# Patient Record
Sex: Female | Born: 1995
Health system: Southern US, Community
[De-identification: ages and names within clinical notes are randomized; demographics above are authoritative.]

## PROBLEM LIST (undated history)

## (undated) ENCOUNTER — Inpatient Hospital Stay: Payer: Self-pay

## (undated) DIAGNOSIS — J45909 Unspecified asthma, uncomplicated: Secondary | ICD-10-CM

## (undated) HISTORY — PX: NO PAST SURGERIES: SHX2092

## (undated) HISTORY — PX: WISDOM TOOTH EXTRACTION: SHX21

---
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
## 2017-02-12 DIAGNOSIS — R197 Diarrhea, unspecified: Secondary | ICD-10-CM | POA: Insufficient documentation

## 2017-02-12 DIAGNOSIS — R1013 Epigastric pain: Secondary | ICD-10-CM | POA: Insufficient documentation

## 2017-02-12 DIAGNOSIS — J45909 Unspecified asthma, uncomplicated: Secondary | ICD-10-CM | POA: Insufficient documentation

## 2017-02-12 LAB — COMPREHENSIVE METABOLIC PANEL
ALBUMIN: 4.2 g/dL (ref 3.5–5.0)
ALT: 12 U/L — AB (ref 14–54)
AST: 33 U/L (ref 15–41)
Alkaline Phosphatase: 43 U/L (ref 38–126)
Anion gap: 6 (ref 5–15)
BILIRUBIN TOTAL: 1.2 mg/dL (ref 0.3–1.2)
BUN: 11 mg/dL (ref 6–20)
CALCIUM: 9.2 mg/dL (ref 8.9–10.3)
CO2: 24 mmol/L (ref 22–32)
Chloride: 108 mmol/L (ref 101–111)
Creatinine, Ser: 0.86 mg/dL (ref 0.44–1.00)
GFR calc Af Amer: >60 mL/min (ref >60)
GFR calc non Af Amer: >60 mL/min (ref >60)
GLUCOSE: 107 mg/dL — AB (ref 65–99)
Potassium: 3.9 mmol/L (ref 3.5–5.1)
SODIUM: 138 mmol/L (ref 135–145)
Total Protein: 7.5 g/dL (ref 6.5–8.1)

## 2017-02-12 LAB — URINALYSIS, COMPLETE (UACMP) WITH MICROSCOPIC
BACTERIA UA: NONE SEEN
BILIRUBIN URINE: NEGATIVE
Glucose, UA: NEGATIVE mg/dL
HGB URINE DIPSTICK: NEGATIVE
Ketones, ur: NEGATIVE mg/dL
NITRITE: NEGATIVE
Protein, ur: NEGATIVE mg/dL
SPECIFIC GRAVITY, URINE: 1.013 (ref 1.005–1.030)
pH: 6 (ref 5.0–8.0)

## 2017-02-12 LAB — CBC
HCT: 41.7 % (ref 35.0–47.0)
Hemoglobin: 14.1 g/dL (ref 12.0–16.0)
MCH: 30.1 pg (ref 26.0–34.0)
MCHC: 33.8 g/dL (ref 32.0–36.0)
MCV: 89.3 fL (ref 80.0–100.0)
Platelets: 325 10*3/uL (ref 150–440)
RBC: 4.67 MIL/uL (ref 3.80–5.20)
RDW: 13.2 % (ref 11.5–14.5)
WBC: 7.6 10*3/uL (ref 3.6–11.0)

## 2017-02-12 LAB — POCT PREGNANCY, URINE: PREG TEST UR: NEGATIVE

## 2017-02-12 LAB — LIPASE, BLOOD: Lipase: 31 U/L (ref 11–51)

## 2017-02-12 NOTE — ED Triage Notes (Signed)
Patient reports having abdominal pain and diarrhea last week.  Tonight while at work became hot and then began to have abdominal cramping especially on the left side.

## 2017-02-13 ENCOUNTER — Emergency Department
Admission: EM | Admit: 2017-02-13 | Discharge: 2017-02-13 | Disposition: A | Discharge status: Home / Self Care | Payer: Medicaid Other | Attending: Emergency Medicine | Admitting: Emergency Medicine

## 2017-02-13 DIAGNOSIS — R197 Diarrhea, unspecified: Secondary | ICD-10-CM

## 2017-02-13 DIAGNOSIS — R1013 Epigastric pain: Secondary | ICD-10-CM

## 2017-02-13 MED ORDER — ONDANSETRON HCL 4 MG PO TABS: ORAL_TABLET | ORAL | 0 refills | Status: DC

## 2017-02-13 MED ORDER — SUCRALFATE 1 G PO TABS: 1.0000 g | ORAL_TABLET | Freq: Four times a day (QID) | ORAL | 1 refills | Status: DC | PRN

## 2017-02-13 MED ORDER — OMEPRAZOLE MAGNESIUM 20 MG PO TBEC: 20.0000 mg | DELAYED_RELEASE_TABLET | Freq: Every day | ORAL | 1 refills | Status: DC

## 2017-02-13 NOTE — ED Notes (Addendum)
Pt reports left lower abd pain for 1 week. Pt states diarrhea x 5 today.  Pt also has nausea but no vomiting.   Pt denies vag bleeding.  No dysuria.  No back pain.  Pt alert.  Pt taking pepto bismol with no relief.

## 2017-02-13 NOTE — ED Provider Notes (Signed)
[facility] Emergency Department Provider Note  ____________________________________________   First MD Initiated Contact with Patient 02/13/17 0200     (approximate)  I have reviewed the triage vital signs and the nursing notes.   HISTORY  Chief Complaint Abdominal Pain    HPI Lori Stevenson is a 21 y.o. female who reports a past history of a stomach ulcer as well as mild, well-controlled asthma who presents for evaluation of abdominal pain intermittently for about a week as well as multiple episodes of diarrhea.  She reports that the symptoms started about a week ago and have been persistent.  She may have told a different location of her pain in triage, but for me she is describing epigastric pain that seems to worse after she eats but resolves relatively quickly.  She has had persistent episodes of diarrhea, usually 5 or 6 episodes per day.  She has had some nausea but no vomiting.  Nothing particular makes her symptoms better nor worse except that she did notice that sometimes it happens more after she eats.  She feels sort of like when she was told in the past that she has an ulcer.  She does not take any acid reflux medicine; she stopped taking it at some point after she was last told to do so.  She denies fever/chills, chest pain, shortness of breath, dysuria, vaginal pain, vaginal bleeding.   Past Medical History:  Diagnosis Date  . Asthma     There are no active problems to display for this patient.   No past surgical history on file.  Prior to Admission medications   Medication Sig Start Date End Date Taking? Authorizing Provider  fluconazole (DIFLUCAN) 150 MG tablet Take 1 tablet (150 mg total) by mouth daily. 05/19/15   Tommi Rumps, PA-C  HYDROcodone-acetaminophen (NORCO) 7.5-325 MG tablet Take 1 tablet by mouth every 6 (six) hours as needed for moderate pain.    Historical Provider, MD  metroNIDAZOLE (FLAGYL) 500 MG tablet Take 1  tablet (500 mg total) by mouth 2 (two) times daily. 05/19/15   Tommi Rumps, PA-C  omeprazole (PRILOSEC OTC) 20 MG tablet Take 1 tablet (20 mg total) by mouth daily. 02/13/17 02/13/18  Loleta Rose, MD  ondansetron (ZOFRAN ODT) 8 MG disintegrating tablet Take 1 tablet (8 mg total) by mouth every 8 (eight) hours as needed for nausea or vomiting. 08/26/15   Joni Reining, PA-C  ondansetron [facility]) 4 MG tablet Take 1-2 tabs by mouth every 8 hours as needed for nausea/vomiting 02/13/17   Loleta Rose, MD  promethazine (PHENERGAN) 25 MG tablet Take 25 mg by mouth every 6 (six) hours as needed for nausea or vomiting.    Historical Provider, MD  sucralfate (CARAFATE) 1 g tablet Take 1 tablet (1 g total) by mouth 4 (four) times daily as needed (for abdominal discomfort, nausea, and/or vomiting). 02/13/17   Loleta Rose, MD  sulfamethoxazole-trimethoprim (BACTRIM DS,SEPTRA DS) 800-160 MG per tablet Take 1 tablet by mouth 2 (two) times daily. 05/21/15   Irean Hong, MD    Allergies Patient has no known allergies.  No family history on file.  Social History Social History  Substance Use Topics  . Smoking status: Never Smoker  . Smokeless tobacco: Not on file  . Alcohol use No    Review of Systems Constitutional: No fever/chills Eyes: No visual changes. ENT: No sore throat. Cardiovascular: Denies chest pain. Respiratory: Denies shortness of breath. Gastrointestinal: EpiGastric abdominal pain.  Nausea  and diarrhea for about a week, no vomiting.  No constipation. Genitourinary: Negative for dysuria. Musculoskeletal: Negative for back pain. Skin: Negative for rash. Neurological: Negative for headaches, focal weakness or numbness.  10-point ROS otherwise negative.  ____________________________________________   PHYSICAL EXAM:  VITAL SIGNS: ED Triage Vitals  Enc Vitals Group     BP 02/12/17 2058 115/64     Pulse Rate 02/12/17 2058 65     Resp 02/12/17 2058 20     Temp 02/12/17 2058 99.3  F (37.4 C)     Temp Source 02/12/17 2058 Oral     SpO2 02/12/17 2058 100 %     Weight 02/12/17 2057 125 lb (56.7 kg)     Height 02/12/17 2057  (1.473 m)     Head Circumference --      Peak Flow --      Pain Score 02/12/17 2056 6     Pain Loc --      Pain Edu? --      Excl. in GC? --     Constitutional: Alert and oriented. Well appearing and in no acute distress. Eyes: Conjunctivae are normal. PERRL. EOMI. Head: Atraumatic. Nose: No congestion/rhinnorhea. Mouth/Throat: Mucous membranes are moist. Neck: No stridor.  No meningeal signs.   Cardiovascular: Normal rate, regular rhythm. Good peripheral circulation. Grossly normal heart sounds. Respiratory: Normal respiratory effort.  No retractions. Lungs CTAB. Gastrointestinal: Soft and nontender. Negative Murphy's sign, no tenderness at McBurney's point.  No distention.  GU:  deferred Musculoskeletal: No lower extremity tenderness nor edema. No gross deformities of extremities. Neurologic:  Normal speech and language. No gross focal neurologic deficits are appreciated.  Skin:  Skin is warm, dry and intact. No rash noted. Psychiatric: Mood and affect are normal. Speech and behavior are normal.  ____________________________________________   LABS (all labs ordered are listed, but only abnormal results are displayed)  Labs Reviewed  COMPREHENSIVE METABOLIC PANEL - Abnormal; Notable for the following:       Result Value   Glucose, Bld 107 (*)    ALT 12 (*)    All other components within normal limits  URINALYSIS, COMPLETE (UACMP) WITH MICROSCOPIC - Abnormal; Notable for the following:    Color, Urine YELLOW (*)    APPearance CLEAR (*)    Leukocytes, UA TRACE (*)    Squamous Epithelial / LPF 0-5 (*)    All other components within normal limits  LIPASE, BLOOD  CBC  POCT PREGNANCY, URINE   ____________________________________________  EKG  None - EKG not ordered by ED  physician ____________________________________________  RADIOLOGY   No results found.  ____________________________________________   PROCEDURES  Critical Care performed: No   Procedure(s) performed:   Procedures   ____________________________________________   INITIAL IMPRESSION / ASSESSMENT AND PLAN / ED COURSE  Pertinent labs & imaging results that were available during my care of the patient were reviewed by me and considered in my medical decision making (see chart for details).  The patient is well-appearing and in no distress with normal vital signs and essentially normal lab work.  She has no elevation of her LFTs and she has no abdominal tenderness to palpation at this time including a negative Murphy sign.  I suspect that she is continuing to suffer from acid reflux but she has a benign abdominal exam that is not consistent with an open ulcer.  I encouraged her to take a PPI.  She may have a viral syndrome which is causing her diarrhea and I gave  her my usual and customary outpatient recommendations for diarrhea management but she has no metabolic disturbances at this time.  There is no indication for further imaging at this time and I gave my usual and customary return precautions.     ____________________________________________  FINAL CLINICAL IMPRESSION(S) / ED DIAGNOSES  Final diagnoses:  Diarrhea, unspecified type  Epigastric pain     MEDICATIONS GIVEN DURING THIS VISIT:  Medications - No data to display   NEW OUTPATIENT MEDICATIONS STARTED DURING THIS VISIT:  New Prescriptions   OMEPRAZOLE (PRILOSEC OTC) 20 MG TABLET    Take 1 tablet (20 mg total) by mouth daily.   ONDANSETRON (ZOFRAN) 4 MG TABLET    Take 1-2 tabs by mouth every 8 hours as needed for nausea/vomiting   SUCRALFATE (CARAFATE) 1 G TABLET    Take 1 tablet (1 g total) by mouth 4 (four) times daily as needed (for abdominal discomfort, nausea, and/or vomiting).    Modified Medications    No medications on file    Discontinued Medications   No medications on file     Note:  This document was prepared using Dragon voice recognition software and may include unintentional dictation errors.    Loleta Rose, MD 02/13/17 [phone]

## 2017-02-13 NOTE — ED Notes (Signed)
Report off to kuch rn  

## 2017-02-13 NOTE — Discharge Instructions (Signed)

## 2017-07-12 IMAGING — US US TRANSVAGINAL NON-OB
1 series · 13 of 25 positions shown · non-contrast
Comparison: None

CLINICAL DATA: Patient was evaluated [REDACTED] night for yeast
infection and bacterial vaginosis. Since pelvic exam [REDACTED] night
patient reports light vaginal bleeding and vaginal discomfort. LMP
05/06/2015

EXAM:
TRANSABDOMINAL AND TRANSVAGINAL ULTRASOUND OF PELVIS
TECHNIQUE: Both transabdominal and transvaginal ultrasound examinations of the
pelvis were performed. Transabdominal technique was performed for
global imaging of the pelvis including uterus, ovaries, adnexal
regions, and pelvic cul-de-sac. It was necessary to proceed with
endovaginal exam following the transabdominal exam to visualize the
endometrium and ovaries.

[Series 1: us transvaginal non-ob · 0.21mm/px · 13 of 81 slices shown]
[im 1/81]
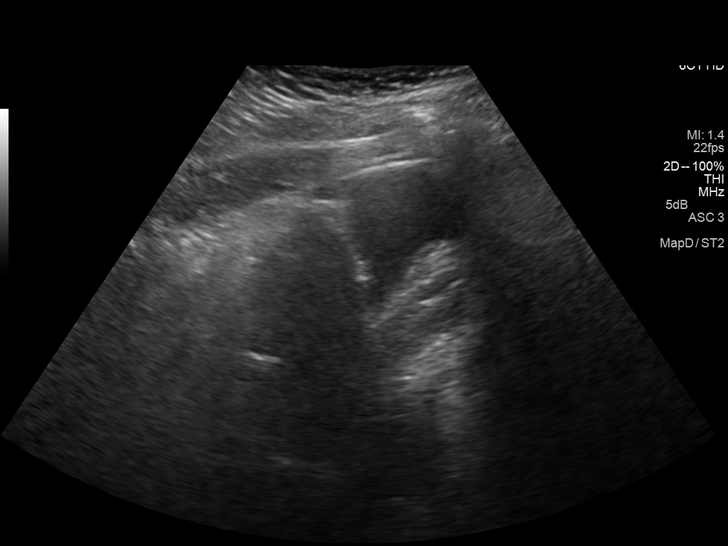
[im 7/81]
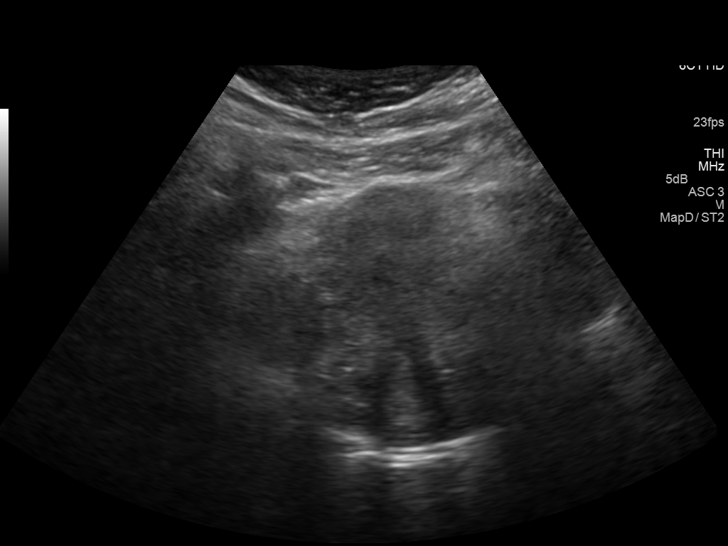
[im 14/81]
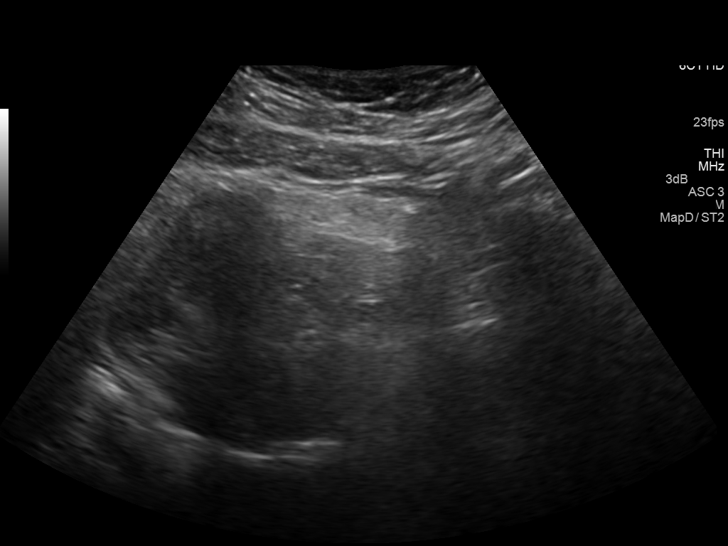
[im 21/81]
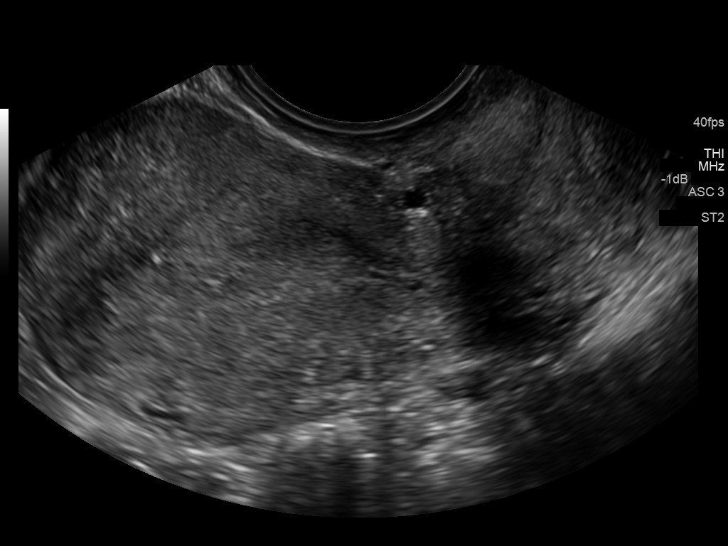
[im 27/81]
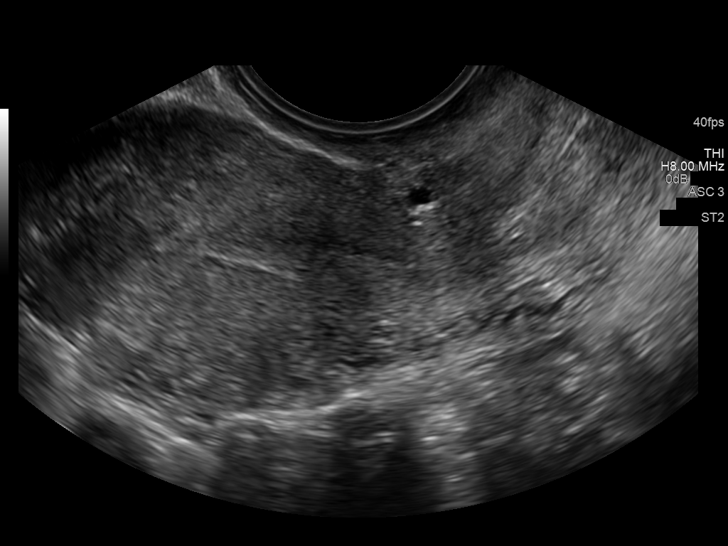
[im 34/81]
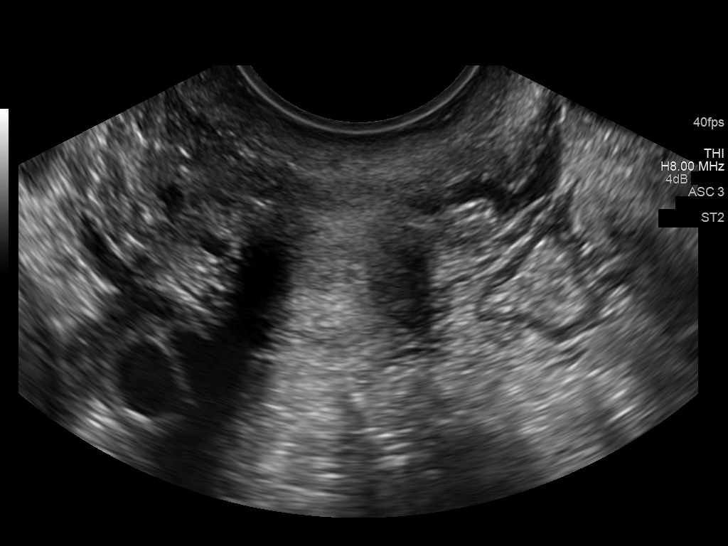
[im 41/81]
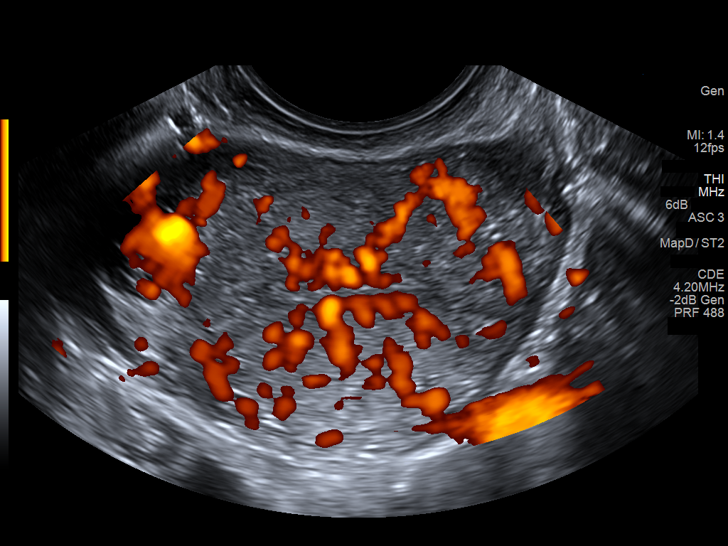
[im 47/81]
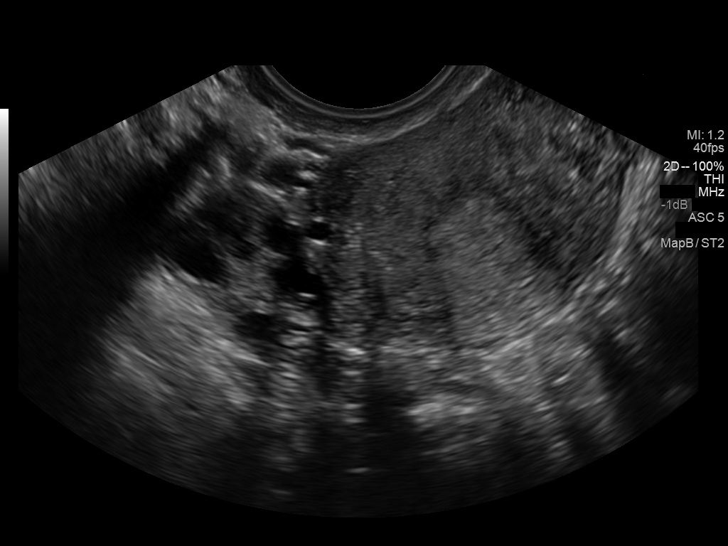
[im 54/81]
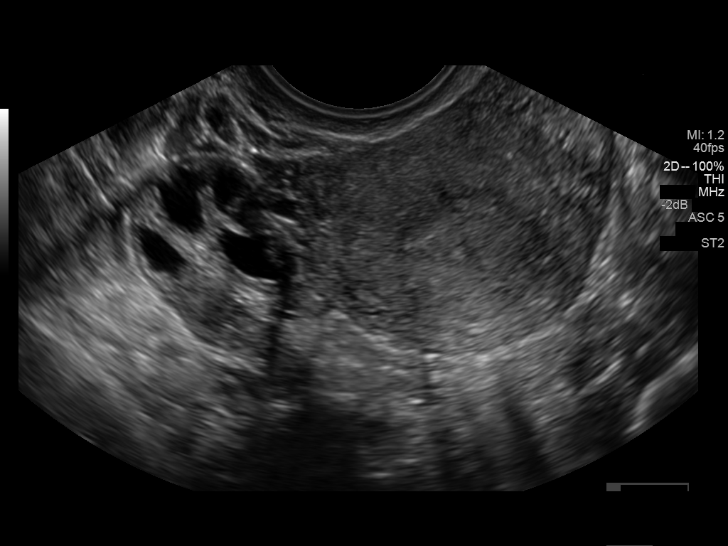
[im 61/81]
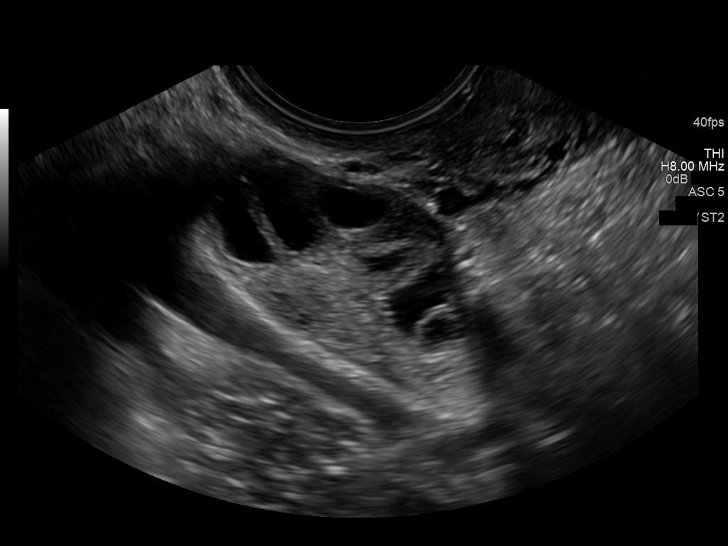
[im 67/81]
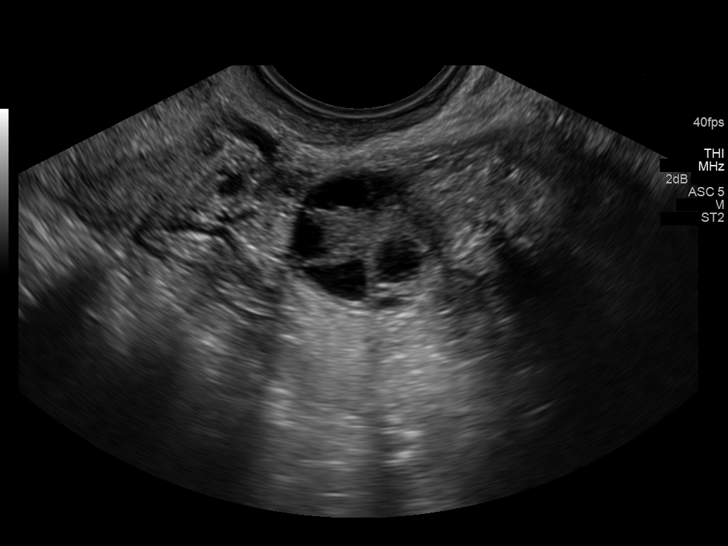
[im 74/81]
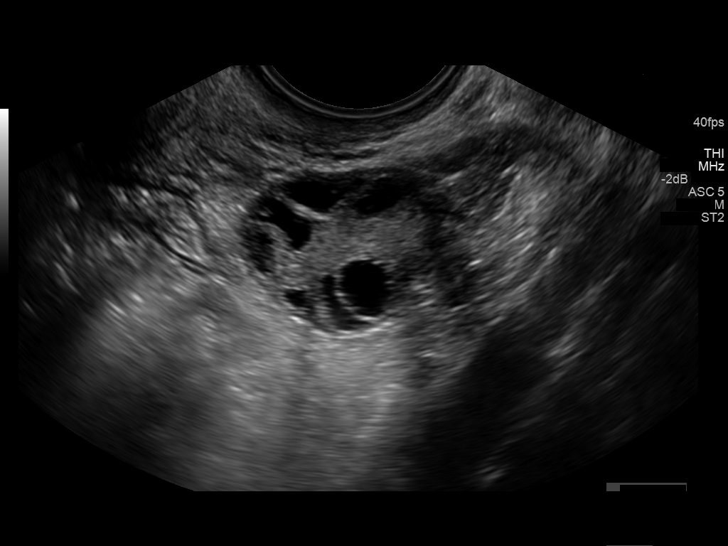
[im 81/81]
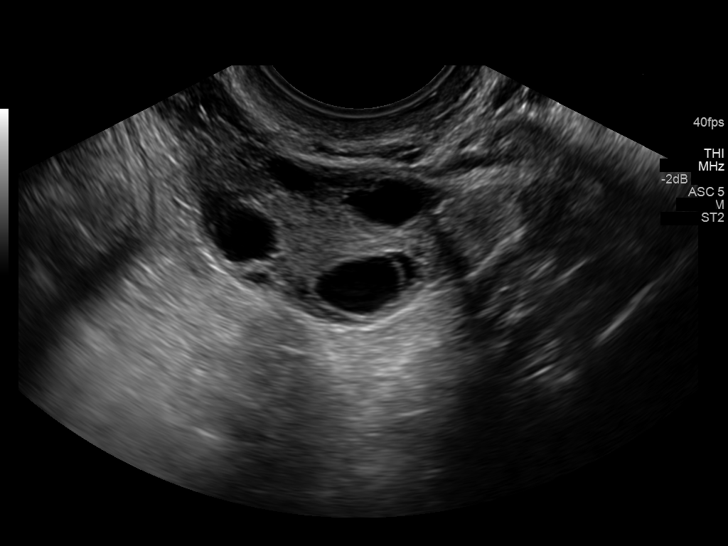

[13 of 25 positions shown; findings below may reference images not displayed]

FINDINGS: Uterus

Measurements: 6.2 x 3.6 x 4.2 cm. No fibroids or other mass
visualized.

Endometrium

Thickness: 5.4 mm.  No focal abnormality visualized.

Right ovary

Measurements: 3.6 x 1.6 x 1.8 cm. Multiple follicles are present.

Left ovary

Measurements: 3.7 x 2.0 x 2.5 cm. Multiple follicles are present.

Other findings

No free fluid.
IMPRESSION: 1. Normal appearance of the uterus and endometrium.
2. Somewhat prominent follicles bilaterally, an appearance which can
be seen in polycystic ovary disease. However this can also be a
variation of normal and correlation is recommended with clinical
findings.

## 2018-03-05 ENCOUNTER — Emergency Department
Admission: EM | Admit: 2018-03-05 | Discharge: 2018-03-05 | Disposition: A | Discharge status: Home / Self Care | Payer: BLUE CROSS/BLUE SHIELD | Attending: Emergency Medicine | Admitting: Emergency Medicine

## 2018-03-05 ENCOUNTER — Encounter: Payer: Self-pay | Admitting: Emergency Medicine

## 2018-03-05 DIAGNOSIS — Z79899 Other long term (current) drug therapy: Secondary | ICD-10-CM | POA: Insufficient documentation

## 2018-03-05 DIAGNOSIS — J4520 Mild intermittent asthma, uncomplicated: Secondary | ICD-10-CM | POA: Insufficient documentation

## 2018-03-05 DIAGNOSIS — R0602 Shortness of breath: Secondary | ICD-10-CM | POA: Diagnosis present

## 2018-03-05 MED ORDER — ALBUTEROL SULFATE (2.5 MG/3ML) 0.083% IN NEBU
INHALATION_SOLUTION | RESPIRATORY_TRACT | Status: AC
  Filled 2018-03-05: qty 6

## 2018-03-05 MED ORDER — ALBUTEROL SULFATE (2.5 MG/3ML) 0.083% IN NEBU: 2.5000 mg | INHALATION_SOLUTION | Freq: Four times a day (QID) | RESPIRATORY_TRACT | 12 refills | Status: DC | PRN

## 2018-03-05 MED ORDER — ALBUTEROL SULFATE (2.5 MG/3ML) 0.083% IN NEBU
5.0000 mg | INHALATION_SOLUTION | Freq: Once | RESPIRATORY_TRACT | Status: AC
  Administered 2018-03-05: 5 mg via RESPIRATORY_TRACT

## 2018-03-05 MED ORDER — ALBUTEROL SULFATE HFA 108 (90 BASE) MCG/ACT IN AERS: 2.0000 | INHALATION_SPRAY | Freq: Four times a day (QID) | RESPIRATORY_TRACT | 2 refills | Status: DC | PRN

## 2018-03-05 NOTE — ED Triage Notes (Signed)
Pt to ED with c/o of SOB r/t asthma. Pt is a [employer] and left without her medication.
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
## 2018-03-05 NOTE — ED Notes (Signed)
See triage note.  States she had an asthma attack this am  States she does not have her inhaler  She left it at school  Lungs clear at present Was given SVN in triage

## 2019-06-30 ENCOUNTER — Ambulatory Visit: Payer: BLUE CROSS/BLUE SHIELD

## 2019-12-22 ENCOUNTER — Other Ambulatory Visit: Payer: Self-pay

## 2019-12-22 ENCOUNTER — Emergency Department
Admission: EM | Admit: 2019-12-22 | Discharge: 2019-12-22 | Disposition: A | Discharge status: Home / Self Care | Payer: Managed Care, Other (non HMO) | Attending: Emergency Medicine | Admitting: Emergency Medicine

## 2019-12-22 ENCOUNTER — Encounter: Payer: Self-pay | Admitting: Emergency Medicine

## 2019-12-22 ENCOUNTER — Emergency Department: Payer: Managed Care, Other (non HMO)

## 2019-12-22 DIAGNOSIS — J209 Acute bronchitis, unspecified: Secondary | ICD-10-CM

## 2019-12-22 DIAGNOSIS — J45901 Unspecified asthma with (acute) exacerbation: Secondary | ICD-10-CM | POA: Insufficient documentation

## 2019-12-22 DIAGNOSIS — Z8616 Personal history of COVID-19: Secondary | ICD-10-CM | POA: Insufficient documentation

## 2019-12-22 DIAGNOSIS — R05 Cough: Secondary | ICD-10-CM | POA: Diagnosis present

## 2019-12-22 MED ORDER — AZITHROMYCIN 250 MG PO TABS: ORAL_TABLET | ORAL | 0 refills | Status: DC

## 2019-12-22 MED ORDER — PREDNISONE 10 MG (21) PO TBPK: ORAL_TABLET | ORAL | 0 refills | Status: DC

## 2019-12-22 NOTE — ED Provider Notes (Signed)
[facility] Emergency Department Provider Note  ____________________________________________   First MD Initiated Contact with Patient 12/22/19 1144     (approximate)  I have reviewed the triage vital signs and the nursing notes.   HISTORY  Chief Complaint Cough, Shortness of Breath, and Asthma    HPI Lori Stevenson is a 24 y.o. female presents emergency department complaining of congestion, headache, increased asthma attacks and cough with a brownish mucus for several days.  Patient was diagnosed with Covid on 12/09/2019.  She states she has since regained her sense of taste and smell.  She denies chest pain/shortness of breath.  She does she just feels like her chest is very tight.  She denies any vomiting or diarrhea.  Remainder review of systems negative    Past Medical History:  Diagnosis Date  . Asthma     There are no problems to display for this patient.   History reviewed. No pertinent surgical history.  Prior to Admission medications   Medication Sig Start Date End Date Taking? Authorizing Provider  albuterol (PROVENTIL HFA;VENTOLIN HFA) 108 (90 Base) MCG/ACT inhaler Inhale 2 puffs into the lungs every 6 (six) hours as needed for wheezing or shortness of breath. 03/05/18   Joni Reining, PA-C  albuterol (PROVENTIL) (2.5 MG/3ML) 0.083% nebulizer solution Take 3 mLs (2.5 mg total) by nebulization every 6 (six) hours as needed for wheezing or shortness of breath. 03/05/18   Joni Reining, PA-C  azithromycin (ZITHROMAX Z-PAK) 250 MG tablet 2 pills today then 1 pill a day for 4 days 12/22/19   Sherrie Mustache Roselyn Bering, PA-C  predniSONE (STERAPRED UNI-PAK 21 TAB) 10 MG (21) TBPK tablet Take 6 pills on day one then decrease by 1 pill each day 12/22/19   Faythe Ghee, PA-C  omeprazole (PRILOSEC OTC) 20 MG tablet Take 1 tablet (20 mg total) by mouth daily. 02/13/17 12/22/19  Loleta Rose, MD  promethazine (PHENERGAN) 25 MG tablet Take 25 mg by mouth every 6  (six) hours as needed for nausea or vomiting.  12/22/19  [provider]  sucralfate (CARAFATE) 1 g tablet Take 1 tablet (1 g total) by mouth 4 (four) times daily as needed (for abdominal discomfort, nausea, and/or vomiting). 02/13/17 12/22/19  Loleta Rose, MD    Allergies Patient has no known allergies.  No family history on file.  Social History Social History   Tobacco Use  . Smoking status: Never Smoker  . Smokeless tobacco: Never Used  Substance Use Topics  . Alcohol use: No  . Drug use: No    Review of Systems  Constitutional: No fever/chills Eyes: No visual changes. ENT: No sore throat. Respiratory: Positive cough Cardiovascular: Denies chest pain Gastrointestinal: Denies abdominal pain Genitourinary: Negative for dysuria. Musculoskeletal: Negative for back pain. Skin: Negative for rash. Psychiatric: no mood changes,     ____________________________________________   PHYSICAL EXAM:  VITAL SIGNS: ED Triage Vitals [12/22/19 1119]  Enc Vitals Group     BP 128/78     Pulse Rate (!) 104     Resp 20     Temp 98 F (36.7 C)     Temp Source Oral     SpO2 100 %     Weight 115 lb (52.2 kg)     Height 4\' 10"  (1.473 m)     Head Circumference      Peak Flow      Pain Score 10     Pain Loc  Pain Edu?      Excl. in GC?     Constitutional: Alert and oriented. Well appearing and in no acute distress. Eyes: Conjunctivae are normal.  Head: Atraumatic. Nose: No congestion/rhinnorhea. Mouth/Throat: Mucous membranes are moist.   Neck:  supple no lymphadenopathy noted Cardiovascular: Normal rate, regular rhythm. Heart sounds are normal Respiratory: Normal respiratory effort.  No retractions, lungs c t a  GU: deferred Musculoskeletal: FROM all extremities, warm and well perfused Neurologic:  Normal speech and language.  Skin:  Skin is warm, dry and intact. No rash noted. Psychiatric: Mood and affect are normal. Speech and behavior are normal.   ____________________________________________   LABS (all labs ordered are listed, but only abnormal results are displayed)  Labs Reviewed - No data to display ____________________________________________   ____________________________________________  RADIOLOGY  Chest x-ray is negative  ____________________________________________   PROCEDURES  Procedure(s) performed: No  Procedures    ____________________________________________   INITIAL IMPRESSION / ASSESSMENT AND PLAN / ED COURSE  Pertinent labs & imaging results that were available during my care of the patient were reviewed by me and considered in my medical decision making (see chart for details).   Patient is 24 year old female presents emergency department with a history of Covid and continued cough.  See HPI  Physical exam shows patient to appear well.  Vitals are normal except her pulse is mildly elevated at 104.  Chest x-ray ordered   Chest x-ray is negative  Explained findings to the patient.  Explained her she can still have some damage from Covid which will make her continued cough for several months.  However due to the brown mucus I do have concern she has a bacterial infection.  She is placed on a Z-Pak and steroid pack.  She is continue using her inhaler.  Return emergency department if worsening.  She states she understands will comply.  She was discharged stable condition  Lori Stevenson was evaluated in Emergency Department on 12/22/2019 for the symptoms described in the history of present illness. She was evaluated in the context of the global COVID-19 pandemic, which necessitated consideration that the patient might be at risk for infection with the SARS-CoV-2 virus that causes COVID-19. Institutional protocols and algorithms that pertain to the evaluation of patients at risk for COVID-19 are in a state of rapid change based on information released by regulatory bodies including the CDC and  federal and state organizations. These policies and algorithms were followed during the patient's care in the ED.   As part of my medical decision making, I reviewed the following data within the electronic MEDICAL RECORD NUMBER Nursing notes reviewed and incorporated, Old chart reviewed, Radiograph reviewed chest x-ray is normal, Notes from prior ED visits and [place] Controlled Substance Database  ____________________________________________   FINAL CLINICAL IMPRESSION(S) / ED DIAGNOSES  Final diagnoses:  Acute bronchitis, unspecified organism  History of COVID-19      NEW MEDICATIONS STARTED DURING THIS VISIT:  Discharge Medication List as of 12/22/2019 12:22 PM    START taking these medications   Details  azithromycin (ZITHROMAX Z-PAK) 250 MG tablet 2 pills today then 1 pill a day for 4 days, Normal    predniSONE (STERAPRED UNI-PAK 21 TAB) 10 MG (21) TBPK tablet Take 6 pills on day one then decrease by 1 pill each day, Normal         Note:  This document was prepared using Dragon voice recognition software and may include unintentional dictation errors.    Fisher,  Linden Dolin, PA-C 12/22/19 1303    Duffy Bruce, MD 12/23/19 [phone]

## 2019-12-22 NOTE — ED Triage Notes (Signed)
Pt reports hx of asthma and reports feels SOB. Pt denies fevers. Pt states that she has continued to cough. Pt reports tested positive for COVID on 2/4.

## 2019-12-22 NOTE — Discharge Instructions (Signed)
Follow-up with your regular doctor return emergency department worsening Take your antibiotic and steroid as prescribed

## 2019-12-22 NOTE — ED Notes (Signed)
See triage note. Pt c/o congestion, headache and increase in asthma attacks since being diagnosed with covid 12/09/19. Pt states she has needed to use her rescue inhaler 2/3 times a day for the last few days. Has also been XL-3 cold & cough.

## 2020-06-28 IMAGING — DX DG CHEST 1V PORT
1 series · 1 of 1 positions shown · non-contrast
Comparison: January 02, 2006

CLINICAL DATA: Cough.  [id number] positive

EXAM:
PORTABLE CHEST 1 VIEW

[chest ap]
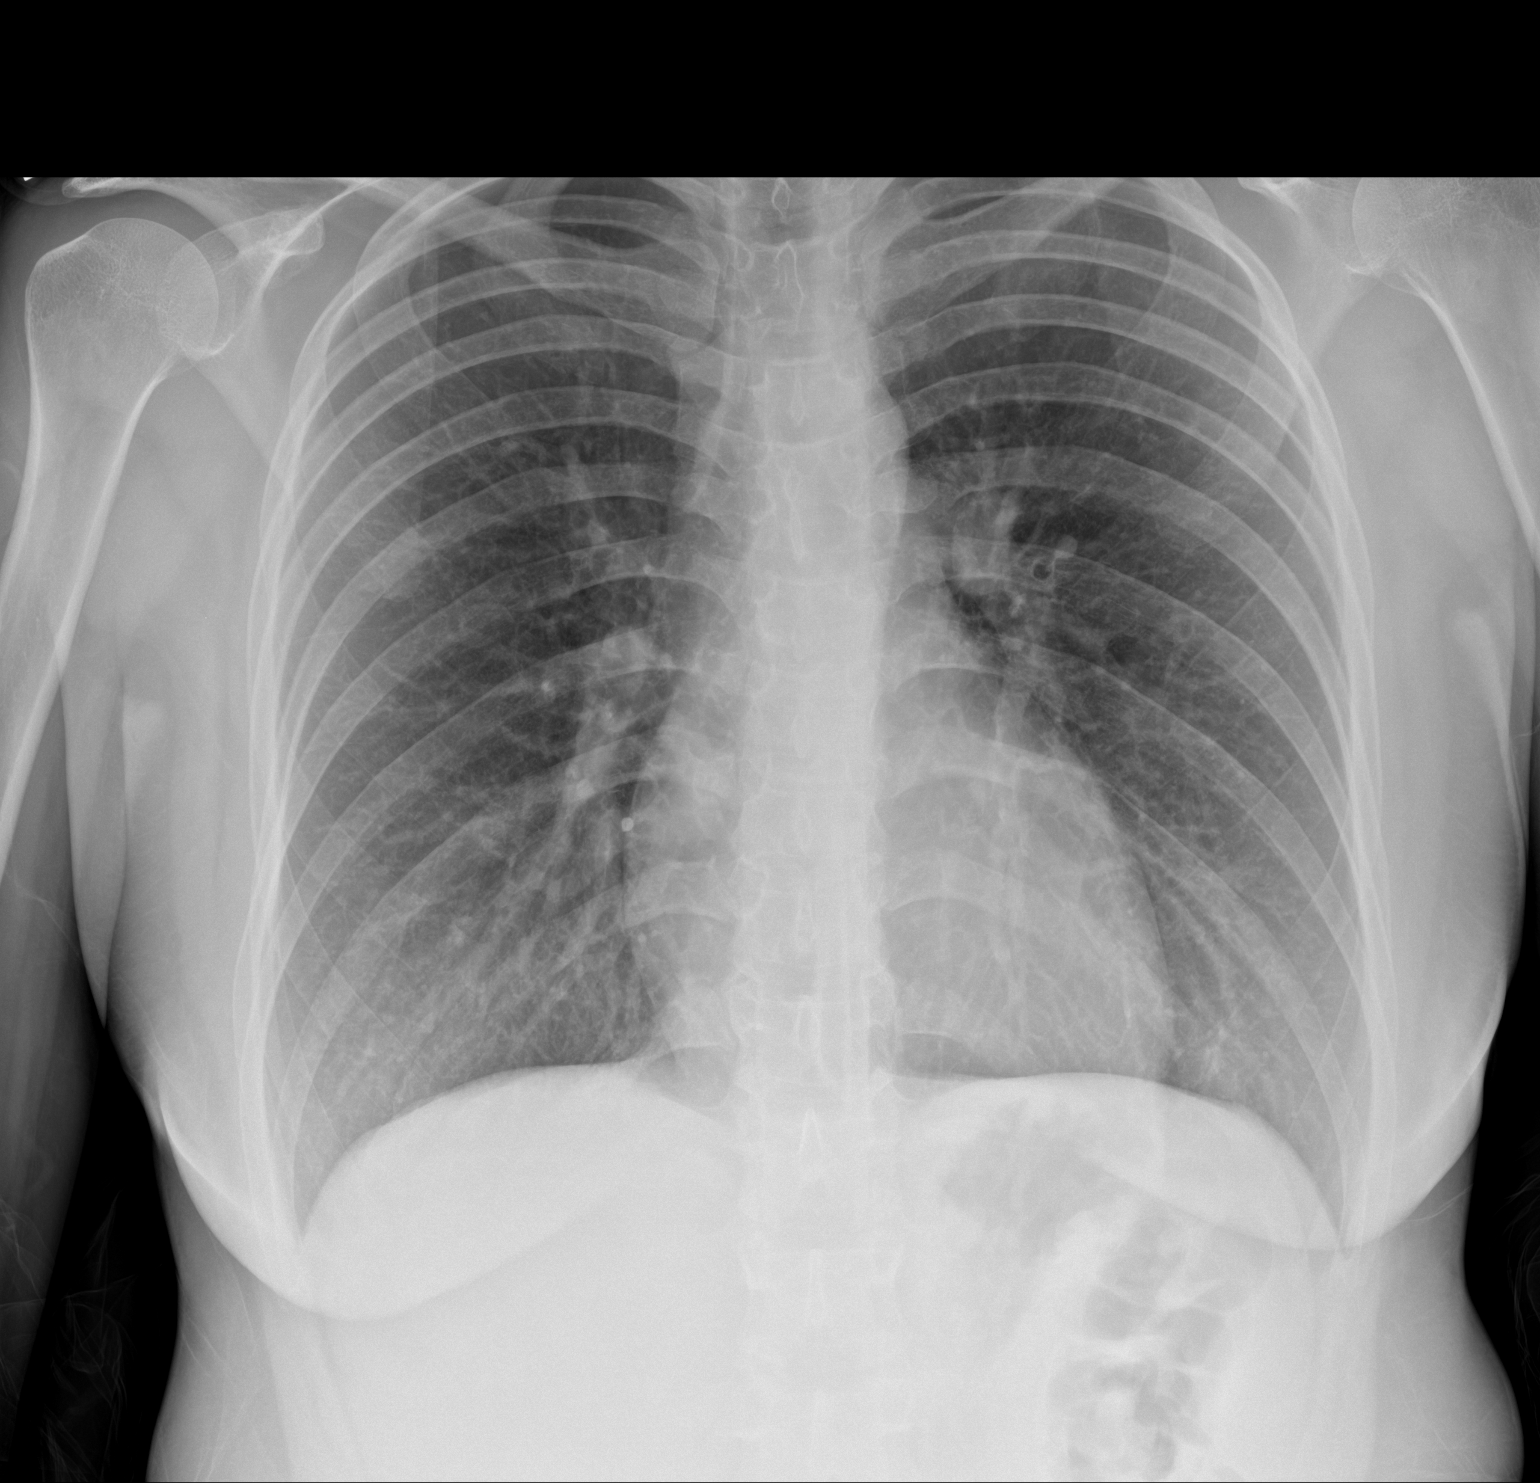

[1 of 1 positions shown; findings below may reference images not displayed]

FINDINGS: Lungs are clear. Heart size and pulmonary vascularity are normal. No
adenopathy. No bone lesions.
IMPRESSION: No abnormality noted.

## 2021-03-02 ENCOUNTER — Other Ambulatory Visit: Payer: Self-pay

## 2021-03-02 ENCOUNTER — Encounter: Payer: Self-pay | Admitting: Emergency Medicine

## 2021-03-02 ENCOUNTER — Ambulatory Visit
Admission: EM | Admit: 2021-03-02 | Discharge: 2021-03-02 | Disposition: A | Discharge status: Home / Self Care | Payer: Self-pay | Attending: Physician Assistant | Admitting: Physician Assistant

## 2021-03-02 DIAGNOSIS — Z20822 Contact with and (suspected) exposure to covid-19: Secondary | ICD-10-CM | POA: Insufficient documentation

## 2021-03-02 DIAGNOSIS — Z79899 Other long term (current) drug therapy: Secondary | ICD-10-CM | POA: Insufficient documentation

## 2021-03-02 DIAGNOSIS — Z28310 Unvaccinated for covid-19: Secondary | ICD-10-CM | POA: Insufficient documentation

## 2021-03-02 DIAGNOSIS — J45901 Unspecified asthma with (acute) exacerbation: Secondary | ICD-10-CM | POA: Insufficient documentation

## 2021-03-02 DIAGNOSIS — J069 Acute upper respiratory infection, unspecified: Secondary | ICD-10-CM | POA: Insufficient documentation

## 2021-03-02 DIAGNOSIS — R21 Rash and other nonspecific skin eruption: Secondary | ICD-10-CM | POA: Insufficient documentation

## 2021-03-02 DIAGNOSIS — R059 Cough, unspecified: Secondary | ICD-10-CM | POA: Insufficient documentation

## 2021-03-02 MED ORDER — PREDNISONE 20 MG PO TABS: 40.0000 mg | ORAL_TABLET | Freq: Every day | ORAL | 0 refills | Status: AC

## 2021-03-02 MED ORDER — TRIAMCINOLONE ACETONIDE 0.1 % EX OINT: 1.0000 "application " | TOPICAL_OINTMENT | Freq: Two times a day (BID) | CUTANEOUS | 0 refills | Status: DC

## 2021-03-02 NOTE — Discharge Instructions (Signed)
Take Mucinex during the day.  Consider use of Flonase.  You can take NyQuil at night for cough and make sure to increase your rest and fluid intake.  COVID test has been performed and someone will contact you if positive.  Results will be available through MyChart.  Please see more information below.  You appear to be having a mild asthma exacerbation.  I have sent prednisone to the pharmacy at this time.  If you feel like your chest is becoming more tight you have more difficulty breathing or wheezing he should be seen again.  I have sent a topical ointment for the rash on your hand.  You have received COVID testing today either for positive exposure, concerning symptoms that could be related to COVID infection, screening purposes, or re-testing after confirmed positive.  Your test obtained today checks for active viral infection in the last 1-2 weeks. If your test is negative now, you can still test positive later. So, if you do develop symptoms you should either get re-tested and/or isolate x 5 days and then strict mask use x 5 days (unvaccinated) or mask use x 10 days (vaccinated). Please follow CDC guidelines.  While Rapid antigen tests come back in 15-20 minutes, send out PCR/molecular test results typically come back within 1-3 days. In the mean time, if you are symptomatic, assume this could be a positive test and treat/monitor yourself as if you do have COVID.   We will call with test results if positive. Please download the MyChart app and set up a profile to access test results.   If symptomatic, go home and rest. Push fluids. Take Tylenol as needed for discomfort. Gargle warm salt water. Throat lozenges. Take Mucinex DM or Robitussin for cough. Humidifier in bedroom to ease coughing. Warm showers. Also review the COVID handout for more information.  COVID-19 INFECTION: The incubation period of COVID-19 is approximately 14 days after exposure, with most symptoms developing in roughly 4-5  days. Symptoms may range in severity from mild to critically severe. Roughly 80% of those infected will have mild symptoms. People of any age may become infected with COVID-19 and have the ability to transmit the virus. The most common symptoms include: fever, fatigue, cough, body aches, headaches, sore throat, nasal congestion, shortness of breath, nausea, vomiting, diarrhea, changes in smell and/or taste.    COURSE OF ILLNESS Some patients may begin with mild disease which can progress quickly into critical symptoms. If your symptoms are worsening please call ahead to the Emergency Department and proceed there for further treatment. Recovery time appears to be roughly 1-2 weeks for mild symptoms and 3-6 weeks for severe disease.   GO IMMEDIATELY TO ER FOR FEVER YOU ARE UNABLE TO GET DOWN WITH TYLENOL, BREATHING PROBLEMS, CHEST PAIN, FATIGUE, LETHARGY, INABILITY TO EAT OR DRINK, ETC  QUARANTINE AND ISOLATION: To help decrease the spread of COVID-19 please remain isolated if you have COVID infection or are highly suspected to have COVID infection. This means -stay home and isolate to one room in the home if you live with others. Do not share a bed or bathroom with others while ill, sanitize and wipe down all countertops and keep common areas clean and disinfected. Stay home for 5 days. If you have no symptoms or your symptoms are resolving after 5 days, you can leave your house. Continue to wear a mask around others for 5 additional days. If you have been in close contact (within 6 feet) of someone diagnosed with  COVID 19, you are advised to quarantine in your home for 14 days as symptoms can develop anywhere from 2-14 days after exposure to the virus. If you develop symptoms, you  must isolate.  Most current guidelines for COVID after exposure -unvaccinated: isolate 5 days and strict mask use x 5 days. Test on day 5 is possible -vaccinated: wear mask x 10 days if symptoms do not develop -You do not  necessarily need to be tested for COVID if you have + exposure and  develop symptoms. Just isolate at home x10 days from symptom onset During this global pandemic, CDC advises to practice social distancing, try to stay at least 66ft away from others at all times. Wear a face covering. Wash and sanitize your hands regularly and avoid going anywhere that is not necessary.  KEEP IN MIND THAT THE COVID TEST IS NOT 100% ACCURATE AND YOU SHOULD STILL DO EVERYTHING TO PREVENT POTENTIAL SPREAD OF VIRUS TO OTHERS (WEAR MASK, WEAR GLOVES, WASH HANDS AND SANITIZE REGULARLY). IF INITIAL TEST IS NEGATIVE, THIS MAY NOT MEAN YOU ARE DEFINITELY NEGATIVE. MOST ACCURATE TESTING IS DONE 5-7 DAYS AFTER EXPOSURE.   It is not advised by CDC to get re-tested after receiving a positive COVID test since you can still test positive for weeks to months after you have already cleared the virus.   *If you have not been vaccinated for COVID, I strongly suggest you consider getting vaccinated as long as there are no contraindications.

## 2021-03-02 NOTE — ED Provider Notes (Signed)
[facility]    CSN: [id number] Arrival date & time: 03/02/21  1343      History   Chief Complaint Chief Complaint  Patient presents with  . Nasal Congestion  . Headache  . Cough    HPI Lori Stevenson is a 25 y.o. female presenting for approximately 2 to 3-day history of fatigue, runny nose, headaches, body aches, cough and chest tightness.  Also notes left-sided ear pressure.  Patient has history of asthma and states it feels a little bit more difficult to breathe.  She does have albuterol inhaler to use.  Does not take a daily inhaler.  She has not had any fevers.  No sick contacts and no known exposure to COVID-19.  Not vaccinated for COVID-19.  Has been taking over-the-counter Claritin and using NyQuil for symptoms.  Additionally she has a rash between her third and fourth digits on for a few days.  Has used over-the-counter hydrocortisone cream without relief.  She says the rash is itchy.  Is not improving or worsening.  No other concerns.  HPI  Past Medical History:  Diagnosis Date  . Asthma     There are no problems to display for this patient.   History reviewed. No pertinent surgical history.  OB History   No obstetric history on file.      Home Medications    Prior to Admission medications   Medication Sig Start Date End Date Taking? Authorizing Provider  albuterol (PROVENTIL HFA;VENTOLIN HFA) 108 (90 Base) MCG/ACT inhaler Inhale 2 puffs into the lungs every 6 (six) hours as needed for wheezing or shortness of breath. 03/05/18  Yes Joni ReiningSmith, Ronald K, PA-C  predniSONE (DELTASONE) 20 MG tablet Take 2 tablets (40 mg total) by mouth daily for 5 days. 03/02/21 03/07/21 Yes Eusebio FriendlyEaves, Loredana Medellin B, PA-C  triamcinolone ointment (KENALOG) 0.1 % Apply 1 application topically 2 (two) times daily. 03/02/21  Yes Eusebio FriendlyEaves, Serafino Burciaga B, PA-C  albuterol (PROVENTIL) (2.5 MG/3ML) 0.083% nebulizer solution Take 3 mLs (2.5 mg total) by nebulization every 6 (six) hours as needed for  wheezing or shortness of breath. 03/05/18   Joni ReiningSmith, Ronald K, PA-C  omeprazole (PRILOSEC OTC) 20 MG tablet Take 1 tablet (20 mg total) by mouth daily. 02/13/17 12/22/19  Loleta RoseForbach, Cory, MD  promethazine (PHENERGAN) 25 MG tablet Take 25 mg by mouth every 6 (six) hours as needed for nausea or vomiting.  12/22/19  [provider]  sucralfate (CARAFATE) 1 g tablet Take 1 tablet (1 g total) by mouth 4 (four) times daily as needed (for abdominal discomfort, nausea, and/or vomiting). 02/13/17 12/22/19  Loleta RoseForbach, Cory, MD    Family History History reviewed. No pertinent family history.  Social History Social History   Tobacco Use  . Smoking status: Never Smoker  . Smokeless tobacco: Never Used  Vaping Use  . Vaping Use: Never used  Substance Use Topics  . Alcohol use: No  . Drug use: No     Allergies   Patient has no known allergies.   Review of Systems Review of Systems  Constitutional: Negative for chills, diaphoresis, fatigue and fever.  HENT: Positive for congestion, ear pain, rhinorrhea and sore throat. Negative for sinus pressure and sinus pain.   Respiratory: Positive for cough, chest tightness and shortness of breath. Negative for wheezing.   Gastrointestinal: Negative for abdominal pain, nausea and vomiting.  Musculoskeletal: Negative for arthralgias and myalgias.  Skin: Positive for rash.  Neurological: Positive for headaches. Negative for weakness.  Hematological: Negative for  adenopathy.     Physical Exam Triage Vital Signs ED Triage Vitals  Enc Vitals Group     BP 03/02/21 1415 98/62     Pulse Rate 03/02/21 1415 65     Resp 03/02/21 1415 15     Temp 03/02/21 1415 98.3 F (36.8 C)     Temp Source 03/02/21 1415 Oral     SpO2 03/02/21 1415 100 %     Weight 03/02/21 1413 120 lb (54.4 kg)     Height 03/02/21 1413 4\' 10"  (1.473 m)     Head Circumference --      Peak Flow --      Pain Score 03/02/21 1413 6     Pain Loc --      Pain Edu? --      Excl. in GC? --     No data found.  Updated Vital Signs BP 98/62 (BP Location: Left Arm)   Pulse 65   Temp 98.3 F (36.8 C) (Oral)   Resp 15   Ht 4\' 10"  (1.473 m)   Wt 120 lb (54.4 kg)   LMP 02/09/2021 (Exact Date)   SpO2 100%   BMI 25.08 kg/m      Physical Exam Vitals and nursing note reviewed.  Constitutional:      General: She is not in acute distress.    Appearance: Normal appearance. She is not ill-appearing or toxic-appearing.  HENT:     Head: Normocephalic and atraumatic.     Nose: Congestion and rhinorrhea present.     Mouth/Throat:     Mouth: Mucous membranes are moist.     Pharynx: Oropharynx is clear. Posterior oropharyngeal erythema (mild) present.  Eyes:     General: No scleral icterus.       Right eye: No discharge.        Left eye: No discharge.     Conjunctiva/sclera: Conjunctivae normal.  Cardiovascular:     Rate and Rhythm: Normal rate and regular rhythm.     Heart sounds: Normal heart sounds.  Pulmonary:     Effort: Pulmonary effort is normal. No respiratory distress.     Breath sounds: Normal breath sounds. No wheezing, rhonchi or rales.  Musculoskeletal:     Cervical back: Neck supple.  Skin:    General: Skin is dry.     Findings: Rash (erythematous patch between 3rd and 4th digits left hand) present.  Neurological:     General: No focal deficit present.     Mental Status: She is alert. Mental status is at baseline.     Motor: No weakness.     Gait: Gait normal.  Psychiatric:        Mood and Affect: Mood normal.        Behavior: Behavior normal.        Thought Content: Thought content normal.      UC Treatments / Results  Labs (all labs ordered are listed, but only abnormal results are displayed) Labs Reviewed  SARS CORONAVIRUS 2 (TAT 6-24 HRS)    EKG   Radiology No results found.  Procedures Procedures (including critical care time)  Medications Ordered in UC Medications - No data to display  Initial Impression / Assessment and Plan /  UC Course  I have reviewed the triage vital signs and the nursing notes.  Pertinent labs & imaging results that were available during my care of the patient were reviewed by me and considered in my medical decision making (see chart for details).  25 year old female presenting for cough, congestion and chest tightness x3 days.  Also presenting for rash of the left hand.  Does have history of asthma.  Vital signs are normal and stable and she is overall well-appearing.  COVID test obtained.  Current CDC counts, isolation protocol and ED precautions reviewed with patient.  Suspect viral illness versus allergies.  Treating at this time with supportive care.  Advised to increase rest and fluids.  Advised over-the-counter Mucinex during the day and NyQuil at night.  Sent prednisone since she appears to be having a very mild asthma exacerbation.  Her chest is clear to auscultation.  Advised her to return for any worsening symptoms.  Rash appears to be contact dermatitis.  Treating at this time with triamcinolone ointment.  Also, advised to return for any worsening of symptoms or if not improving over the next 2 weeks.  Final Clinical Impressions(s) / UC Diagnoses   Final diagnoses:  Viral upper respiratory tract infection  Cough  Asthma with acute exacerbation, unspecified asthma severity, unspecified whether persistent  Rash of hand     Discharge Instructions     Take Mucinex during the day.  Consider use of Flonase.  You can take NyQuil at night for cough and make sure to increase your rest and fluid intake.  COVID test has been performed and someone will contact you if positive.  Results will be available through MyChart.  Please see more information below.  You appear to be having a mild asthma exacerbation.  I have sent prednisone to the pharmacy at this time.  If you feel like your chest is becoming more tight you have more difficulty breathing or wheezing he should be seen again.  I  have sent a topical ointment for the rash on your hand.  You have received COVID testing today either for positive exposure, concerning symptoms that could be related to COVID infection, screening purposes, or re-testing after confirmed positive.  Your test obtained today checks for active viral infection in the last 1-2 weeks. If your test is negative now, you can still test positive later. So, if you do develop symptoms you should either get re-tested and/or isolate x 5 days and then strict mask use x 5 days (unvaccinated) or mask use x 10 days (vaccinated). Please follow CDC guidelines.  While Rapid antigen tests come back in 15-20 minutes, send out PCR/molecular test results typically come back within 1-3 days. In the mean time, if you are symptomatic, assume this could be a positive test and treat/monitor yourself as if you do have COVID.   We will call with test results if positive. Please download the MyChart app and set up a profile to access test results.   If symptomatic, go home and rest. Push fluids. Take Tylenol as needed for discomfort. Gargle warm salt water. Throat lozenges. Take Mucinex DM or Robitussin for cough. Humidifier in bedroom to ease coughing. Warm showers. Also review the COVID handout for more information.  COVID-19 INFECTION: The incubation period of COVID-19 is approximately 14 days after exposure, with most symptoms developing in roughly 4-5 days. Symptoms may range in severity from mild to critically severe. Roughly 80% of those infected will have mild symptoms. People of any age may become infected with COVID-19 and have the ability to transmit the virus. The most common symptoms include: fever, fatigue, cough, body aches, headaches, sore throat, nasal congestion, shortness of breath, nausea, vomiting, diarrhea, changes in smell and/or taste.    COURSE OF  ILLNESS Some patients may begin with mild disease which can progress quickly into critical symptoms. If your  symptoms are worsening please call ahead to the Emergency Department and proceed there for further treatment. Recovery time appears to be roughly 1-2 weeks for mild symptoms and 3-6 weeks for severe disease.   GO IMMEDIATELY TO ER FOR FEVER YOU ARE UNABLE TO GET DOWN WITH TYLENOL, BREATHING PROBLEMS, CHEST PAIN, FATIGUE, LETHARGY, INABILITY TO EAT OR DRINK, ETC  QUARANTINE AND ISOLATION: To help decrease the spread of COVID-19 please remain isolated if you have COVID infection or are highly suspected to have COVID infection. This means -stay home and isolate to one room in the home if you live with others. Do not share a bed or bathroom with others while ill, sanitize and wipe down all countertops and keep common areas clean and disinfected. Stay home for 5 days. If you have no symptoms or your symptoms are resolving after 5 days, you can leave your house. Continue to wear a mask around others for 5 additional days. If you have been in close contact (within 6 feet) of someone diagnosed with COVID 19, you are advised to quarantine in your home for 14 days as symptoms can develop anywhere from 2-14 days after exposure to the virus. If you develop symptoms, you  must isolate.  Most current guidelines for COVID after exposure -unvaccinated: isolate 5 days and strict mask use x 5 days. Test on day 5 is possible -vaccinated: wear mask x 10 days if symptoms do not develop -You do not necessarily need to be tested for COVID if you have + exposure and  develop symptoms. Just isolate at home x10 days from symptom onset During this global pandemic, CDC advises to practice social distancing, try to stay at least 47ft away from others at all times. Wear a face covering. Wash and sanitize your hands regularly and avoid going anywhere that is not necessary.  KEEP IN MIND THAT THE COVID TEST IS NOT 100% ACCURATE AND YOU SHOULD STILL DO EVERYTHING TO PREVENT POTENTIAL SPREAD OF VIRUS TO OTHERS (WEAR MASK, WEAR GLOVES,  WASH HANDS AND SANITIZE REGULARLY). IF INITIAL TEST IS NEGATIVE, THIS MAY NOT MEAN YOU ARE DEFINITELY NEGATIVE. MOST ACCURATE TESTING IS DONE 5-7 DAYS AFTER EXPOSURE.   It is not advised by CDC to get re-tested after receiving a positive COVID test since you can still test positive for weeks to months after you have already cleared the virus.   *If you have not been vaccinated for COVID, I strongly suggest you consider getting vaccinated as long as there are no contraindications.      ED Prescriptions    Medication Sig Dispense Auth. Provider   predniSONE (DELTASONE) 20 MG tablet Take 2 tablets (40 mg total) by mouth daily for 5 days. 10 tablet Eusebio Friendly B, PA-C   triamcinolone ointment (KENALOG) 0.1 % Apply 1 application topically 2 (two) times daily. 30 g Gareth Morgan     PDMP not reviewed this encounter.   Shirlee Latch, PA-C 03/02/21 1451

## 2021-03-02 NOTE — ED Triage Notes (Signed)
Patient c/o runny nose, headache, cough, chest tightness, and left ear pressure that started 2-3 days ago.  Patient denies fevers.  Patient states that she has asthma.

## 2021-03-03 LAB — SARS CORONAVIRUS 2 (TAT 6-24 HRS): SARS Coronavirus 2: NEGATIVE

## 2021-04-28 ENCOUNTER — Other Ambulatory Visit: Payer: Self-pay

## 2021-04-28 ENCOUNTER — Encounter: Payer: Self-pay | Admitting: Gynecology

## 2021-04-28 ENCOUNTER — Telehealth: Payer: Self-pay | Admitting: Emergency Medicine

## 2021-04-28 ENCOUNTER — Ambulatory Visit
Admission: EM | Admit: 2021-04-28 | Discharge: 2021-04-28 | Disposition: A | Discharge status: Home / Self Care | Payer: BC Managed Care – PPO | Attending: Sports Medicine | Admitting: Sports Medicine

## 2021-04-28 DIAGNOSIS — Z7952 Long term (current) use of systemic steroids: Secondary | ICD-10-CM | POA: Diagnosis not present

## 2021-04-28 DIAGNOSIS — R490 Dysphonia: Secondary | ICD-10-CM | POA: Insufficient documentation

## 2021-04-28 DIAGNOSIS — J45909 Unspecified asthma, uncomplicated: Secondary | ICD-10-CM | POA: Diagnosis not present

## 2021-04-28 DIAGNOSIS — U071 COVID-19: Secondary | ICD-10-CM | POA: Diagnosis not present

## 2021-04-28 DIAGNOSIS — R059 Cough, unspecified: Secondary | ICD-10-CM

## 2021-04-28 DIAGNOSIS — J029 Acute pharyngitis, unspecified: Secondary | ICD-10-CM | POA: Insufficient documentation

## 2021-04-28 DIAGNOSIS — J3489 Other specified disorders of nose and nasal sinuses: Secondary | ICD-10-CM | POA: Diagnosis not present

## 2021-04-28 DIAGNOSIS — J069 Acute upper respiratory infection, unspecified: Secondary | ICD-10-CM

## 2021-04-28 DIAGNOSIS — R062 Wheezing: Secondary | ICD-10-CM

## 2021-04-28 DIAGNOSIS — Z8709 Personal history of other diseases of the respiratory system: Secondary | ICD-10-CM

## 2021-04-28 LAB — POCT RAPID STREP A: Streptococcus, Group A Screen (Direct): NEGATIVE

## 2021-04-28 MED ORDER — ALBUTEROL SULFATE HFA 108 (90 BASE) MCG/ACT IN AERS: 2.0000 | INHALATION_SPRAY | Freq: Four times a day (QID) | RESPIRATORY_TRACT | 2 refills | Status: DC | PRN

## 2021-04-28 MED ORDER — PREDNISONE 10 MG (21) PO TBPK: ORAL_TABLET | Freq: Every day | ORAL | 0 refills | Status: DC

## 2021-04-28 MED ORDER — FLUTICASONE PROPIONATE 50 MCG/ACT NA SUSP: 2.0000 | Freq: Every day | NASAL | 0 refills | Status: DC

## 2021-04-28 MED ORDER — PROMETHAZINE-DM 6.25-15 MG/5ML PO SYRP: 5.0000 mL | ORAL_SOLUTION | Freq: Four times a day (QID) | ORAL | 0 refills | Status: DC | PRN

## 2021-04-28 NOTE — Discharge Instructions (Addendum)
As we discussed, your strep test was negative.  Sent off the culture to the hospital.  If it is positive someone will contact you with antibiotics. I renewed your albuterol. I prescribed a cough medication, Flonase for your congestion and postnasal drip, and a steroid taper given that you have asthma. Please see educational handouts. You do need a primary care provider and I put in a referral.  Please attempt to try to obtain one as this will only help manage her chronic asthma. If your symptoms worsen in any way then please go to the ER.

## 2021-04-28 NOTE — ED Triage Notes (Signed)
Patient c/o sore throat/ coughing / headache x  yesterday.

## 2021-04-28 NOTE — Telephone Encounter (Signed)
Patient's prescription for albuterol was printed and not handed to patient.  The albuterol prescription has been sent to her pharmacy.

## 2021-04-28 NOTE — ED Provider Notes (Signed)
[facility]    CSN: [id number] Arrival date & time: 04/28/21  1117      History   Chief Complaint Chief Complaint  Patient presents with   Cough    Sore throat   Sore Throat    HPI Lori Stevenson is a 25 y.o. female.   25 year old female who presents for evaluation of sore throat, cough, headache, congestion, and myalgias since yesterday.  She does have a history of asthma and takes albuterol.  Cough is her main complaint.  She denies any significant chest pain or shortness of breath.  No fever shakes chills.  No nausea vomiting or diarrhea.  She noted some rhinorrhea as well as some hoarseness to her throat.  Inhaler does not seem to be helping as much as it normally does.  She does not have a primary care provider.  No red flag signs or symptoms elicited on history.   Past Medical History:  Diagnosis Date   Asthma     There are no problems to display for this patient.   History reviewed. No pertinent surgical history.  OB History   No obstetric history on file.      Home Medications    Prior to Admission medications   Medication Sig Start Date End Date Taking? Authorizing Provider  fluticasone (FLONASE) 50 MCG/ACT nasal spray Place 2 sprays into both nostrils daily. 04/28/21  Yes Delton See, MD  predniSONE (STERAPRED UNI-PAK 21 TAB) 10 MG (21) TBPK tablet Take by mouth daily. Take 6 tabs by mouth daily  for 2 days, then 5 tabs for 2 days, then 4 tabs for 2 days, then 3 tabs for 2 days, 2 tabs for 2 days, then 1 tab by mouth daily for 2 days 04/28/21  Yes Delton See, MD  promethazine-dextromethorphan (PROMETHAZINE-DM) 6.25-15 MG/5ML syrup Take 5 mLs by mouth 4 (four) times daily as needed for cough. 04/28/21  Yes Delton See, MD  triamcinolone ointment (KENALOG) 0.1 % Apply 1 application topically 2 (two) times daily. 03/02/21  Yes Shirlee Latch, PA-C  albuterol (VENTOLIN HFA) 108 (90 Base) MCG/ACT inhaler Inhale 2 puffs into the lungs every  6 (six) hours as needed for wheezing or shortness of breath. 04/28/21   Delton See, MD  omeprazole (PRILOSEC OTC) 20 MG tablet Take 1 tablet (20 mg total) by mouth daily. 02/13/17 12/22/19  Loleta Rose, MD  promethazine (PHENERGAN) 25 MG tablet Take 25 mg by mouth every 6 (six) hours as needed for nausea or vomiting.  12/22/19  [provider]  sucralfate (CARAFATE) 1 g tablet Take 1 tablet (1 g total) by mouth 4 (four) times daily as needed (for abdominal discomfort, nausea, and/or vomiting). 02/13/17 12/22/19  Loleta Rose, MD    Family History Family History  Problem Relation Age of Onset   Thyroid disease Mother     Social History Social History   Tobacco Use   Smoking status: Never   Smokeless tobacco: Never  Vaping Use   Vaping Use: Never used  Substance Use Topics   Alcohol use: No   Drug use: No     Allergies   Patient has no known allergies.   Review of Systems Review of Systems  Constitutional:  Negative for activity change, appetite change, chills, diaphoresis, fatigue and fever.  HENT:  Positive for congestion, postnasal drip, rhinorrhea, sore throat and voice change. Negative for ear pain, sinus pressure, sinus pain and sneezing.   Eyes:  Negative for pain.  Respiratory:  Positive  for cough. Negative for chest tightness and shortness of breath.   Cardiovascular:  Negative for chest pain and palpitations.  Gastrointestinal:  Negative for abdominal pain, diarrhea, nausea and vomiting.  Genitourinary:  Negative for dysuria.  Musculoskeletal:  Positive for myalgias. Negative for back pain and neck pain.  Skin:  Negative for color change, pallor, rash and wound.  Neurological:  Negative for dizziness, light-headedness, numbness and headaches.  All other systems reviewed and are negative.   Physical Exam Triage Vital Signs ED Triage Vitals  Enc Vitals Group     BP 04/28/21 1139 106/77     Pulse Rate 04/28/21 1139 91     Resp 04/28/21 1139 16      Temp 04/28/21 1139 99.7 F (37.6 C)     Temp Source 04/28/21 1139 Oral     SpO2 04/28/21 1139 98 %     Weight 04/28/21 1140 115 lb (52.2 kg)     Height 04/28/21 1140 4\' 10"  (1.473 m)     Head Circumference --      Peak Flow --      Pain Score 04/28/21 1139 8     Pain Loc --      Pain Edu? --      Excl. in GC? --    No data found.  Updated Vital Signs BP 106/77 (BP Location: Left Arm)   Pulse 91   Temp 99.7 F (37.6 C) (Oral)   Resp 16   Ht 4\' 10"  (1.473 m)   Wt 52.2 kg   LMP 03/30/2021   SpO2 98%   BMI 24.04 kg/m   Visual Acuity Right Eye Distance:   Left Eye Distance:   Bilateral Distance:    Right Eye Near:   Left Eye Near:    Bilateral Near:     Physical Exam Vitals and nursing note reviewed.  Constitutional:      General: She is not in acute distress.    Appearance: Normal appearance. She is well-developed. She is not ill-appearing, toxic-appearing or diaphoretic.  HENT:     Head: Normocephalic and atraumatic.     Left Ear: Tympanic membrane normal.     Nose: Nose normal.     Mouth/Throat:     Mouth: Mucous membranes are moist. No oral lesions.     Pharynx: Uvula midline. Posterior oropharyngeal erythema present. No pharyngeal swelling, oropharyngeal exudate or uvula swelling.     Tonsils: No tonsillar exudate or tonsillar abscesses. 1+ on the right. 1+ on the left.  Eyes:     Conjunctiva/sclera: Conjunctivae normal.     Pupils: Pupils are equal, round, and reactive to light.  Neck:     Thyroid: No thyromegaly.  Cardiovascular:     Rate and Rhythm: Normal rate and regular rhythm.     Pulses: Normal pulses.     Heart sounds: Normal heart sounds. No murmur heard.   No friction rub. No gallop.  Pulmonary:     Effort: Pulmonary effort is normal.     Breath sounds: No stridor. Wheezing present. No rhonchi or rales.  Musculoskeletal:     Cervical back: Normal range of motion and neck supple.  Lymphadenopathy:     Cervical: Cervical adenopathy present.   Skin:    General: Skin is warm and dry.     Capillary Refill: Capillary refill takes less than 2 seconds.     Findings: No rash.  Neurological:     General: No focal deficit present.     Mental Status:  She is alert and oriented to person, place, and time.     UC Treatments / Results  Labs (all labs ordered are listed, but only abnormal results are displayed) Labs Reviewed  SARS CORONAVIRUS 2 (TAT 6-24 HRS)  CULTURE, GROUP A STREP [facility])  POCT RAPID STREP A, ED / UC  POCT RAPID STREP A    EKG   Radiology No results found.  Procedures Procedures (including critical care time)  Medications Ordered in UC Medications - No data to display  Initial Impression / Assessment and Plan / UC Course  I have reviewed the triage vital signs and the nursing notes.  Pertinent labs & imaging results that were available during my care of the patient were reviewed by me and considered in my medical decision making (see chart for details).  Clinical impression: 1.  Viral upper respiratory infection with cough, sore throat, hoarseness to her voice, rhinorrhea. 2.  History of asthma with wheeze that is mild on exam today.  Treatment plan: 1.  The findings and treatment plan were discussed in detail with the patient.  Patient was in agreement. 2.  Obtain a strep test that was negative.  We sent off the culture to the hospital.  Someone will contact her if she is in need of antibiotics. 3.  Also sent off a COVID test to the hospital.  Someone contact her if her results are positive. 4.  Asked her to follow along with the app on her phone called MyChart. 5.  Educational handouts provided. 6.  She does not have a PCP you so I put a referral in to assist her with obtaining one.  She needs to have somebody managing her chronic asthma.  Is certainly not ideal for her long-term medical care to continue to go to an urgent care to have this managed by different providers on an ongoing basis. 7.   Given her wheeze and the fact she has asthma I went ahead and prescribed a prednisone taper.  Renewed her albuterol as well.  Gave her a prescription for cough medicine.  I also prescribed Flonase for her congestion and postnasal drip. 8.  If her symptoms worsened I advised her to go to the emergency room. 9.  She was discharged in stable condition and will follow-up here as needed.     Final Clinical Impressions(s) / UC Diagnoses   Final diagnoses:  Viral upper respiratory tract infection  Cough  Sore throat  Hoarseness of voice  Rhinorrhea  Wheeze  History of asthma     Discharge Instructions      As we discussed, your strep test was negative.  Sent off the culture to the hospital.  If it is positive someone will contact you with antibiotics. I renewed your albuterol. I prescribed a cough medication, Flonase for your congestion and postnasal drip, and a steroid taper given that you have asthma. Please see educational handouts. You do need a primary care provider and I put in a referral.  Please attempt to try to obtain one as this will only help manage her chronic asthma. If your symptoms worsen in any way then please go to the ER.     ED Prescriptions     Medication Sig Dispense Auth. Provider   predniSONE (STERAPRED UNI-PAK 21 TAB) 10 MG (21) TBPK tablet Take by mouth daily. Take 6 tabs by mouth daily  for 2 days, then 5 tabs for 2 days, then 4 tabs for 2 days, then 3 tabs for  2 days, 2 tabs for 2 days, then 1 tab by mouth daily for 2 days 21 tablet Delton See, MD   albuterol (VENTOLIN HFA) 108 (90 Base) MCG/ACT inhaler Inhale 2 puffs into the lungs every 6 (six) hours as needed for wheezing or shortness of breath. 1 each Delton See, MD   fluticasone [facility]) 50 MCG/ACT nasal spray Place 2 sprays into both nostrils daily. 15.8 mL Delton See, MD   promethazine-dextromethorphan (PROMETHAZINE-DM) 6.25-15 MG/5ML syrup Take 5 mLs by mouth 4 (four) times daily as  needed for cough. 180 mL Delton See, MD      PDMP not reviewed this encounter.   Delton See, MD 04/28/21 1259

## 2021-04-29 LAB — SARS CORONAVIRUS 2 (TAT 6-24 HRS): SARS Coronavirus 2: POSITIVE — AB

## 2021-05-01 LAB — CULTURE, GROUP A STREP (THRC)

## 2021-05-11 ENCOUNTER — Telehealth: Payer: Self-pay

## 2021-05-11 NOTE — Telephone Encounter (Signed)
Called to schedule new patient appointment, left message.

## 2021-06-18 ENCOUNTER — Encounter: Payer: Self-pay | Admitting: Obstetrics and Gynecology

## 2021-06-18 ENCOUNTER — Other Ambulatory Visit: Payer: Self-pay

## 2021-06-18 ENCOUNTER — Ambulatory Visit (INDEPENDENT_AMBULATORY_CARE_PROVIDER_SITE_OTHER): Payer: BC Managed Care – PPO | Admitting: Obstetrics and Gynecology

## 2021-06-18 ENCOUNTER — Telehealth: Payer: Self-pay | Admitting: Obstetrics and Gynecology

## 2021-06-18 VITALS — BP 109/77 | HR 87 | Resp 16 | Ht <= 58 in | Wt 128.8 lb

## 2021-06-18 DIAGNOSIS — N946 Dysmenorrhea, unspecified: Secondary | ICD-10-CM | POA: Diagnosis not present

## 2021-06-18 DIAGNOSIS — Z30011 Encounter for initial prescription of contraceptive pills: Secondary | ICD-10-CM

## 2021-06-18 DIAGNOSIS — Z30019 Encounter for initial prescription of contraceptives, unspecified: Secondary | ICD-10-CM | POA: Diagnosis not present

## 2021-06-18 LAB — POCT URINE PREGNANCY: Preg Test, Ur: NEGATIVE

## 2021-06-18 MED ORDER — LEVONORGEST-ETH ESTRAD 91-DAY 0.15-0.03 &0.01 MG PO TABS: 1.0000 | ORAL_TABLET | Freq: Every day | ORAL | 1 refills | Status: DC

## 2021-06-18 NOTE — Telephone Encounter (Signed)
Pt called following up on birth control RX

## 2021-06-18 NOTE — Progress Notes (Signed)
HPI:      [name] is a 25 y.o. No obstetric history on file. who LMP was Patient's last menstrual period was 05/27/2021 (exact date).  Subjective:   She presents today stating that she has very painful menstrual periods sometimes causing nausea and vomiting.  In addition, she is sexually active and using withdrawal as birth control.  She states that when she was younger she was on OCPs for severe dysmenorrhea and these helped.  She would like to go back on OCPs for the same issue as well as for birth control. Additionally, she states that her last Pap smear was in 2018.    Hx: The following portions of the patient's history were reviewed and updated as appropriate:             She  has a past medical history of Asthma. She does not have a problem list on file. She  has no past surgical history on file. Her family history includes Thyroid disease in her mother. She  reports that she has never smoked. She has never used smokeless tobacco. She reports that she does not drink alcohol and does not use drugs. She has a current medication list which includes the following prescription(s): albuterol, fluticasone, triamcinolone ointment, [DISCONTINUED] omeprazole, [DISCONTINUED] promethazine, and [DISCONTINUED] sucralfate. She has No Known Allergies.       Review of Systems:  Review of Systems  Constitutional: Denied constitutional symptoms, night sweats, recent illness, fatigue, fever, insomnia and weight loss.  Eyes: Denied eye symptoms, eye pain, photophobia, vision change and visual disturbance.  Ears/Nose/Throat/Neck: Denied ear, nose, throat or neck symptoms, hearing loss, nasal discharge, sinus congestion and sore throat.  Cardiovascular: Denied cardiovascular symptoms, arrhythmia, chest pain/pressure, edema, exercise intolerance, orthopnea and palpitations.  Respiratory: Denied pulmonary symptoms, asthma, pleuritic pain, productive sputum, cough, dyspnea and wheezing.   Gastrointestinal: Denied, gastro-esophageal reflux, melena, nausea and vomiting.  Genitourinary: See HPI for additional information.  Musculoskeletal: Denied musculoskeletal symptoms, stiffness, swelling, muscle weakness and myalgia.  Dermatologic: Denied dermatology symptoms, rash and scar.  Neurologic: Denied neurology symptoms, dizziness, headache, neck pain and syncope.  Psychiatric: Denied psychiatric symptoms, anxiety and depression.  Endocrine: Denied endocrine symptoms including hot flashes and night sweats.   Meds:   Current Outpatient Medications on File Prior to Visit  Medication Sig Dispense Refill   albuterol (VENTOLIN HFA) 108 (90 Base) MCG/ACT inhaler Inhale 2 puffs into the lungs every 6 (six) hours as needed for wheezing or shortness of breath. 1 each 2   fluticasone (FLONASE) 50 MCG/ACT nasal spray Place 2 sprays into both nostrils daily. 15.8 mL 0   triamcinolone ointment (KENALOG) 0.1 % Apply 1 application topically 2 (two) times daily. 30 g 0   [DISCONTINUED] omeprazole (PRILOSEC OTC) 20 MG tablet Take 1 tablet (20 mg total) by mouth daily. 28 tablet 1   [DISCONTINUED] promethazine (PHENERGAN) 25 MG tablet Take 25 mg by mouth every 6 (six) hours as needed for nausea or vomiting.     [DISCONTINUED] sucralfate (CARAFATE) 1 g tablet Take 1 tablet (1 g total) by mouth 4 (four) times daily as needed (for abdominal discomfort, nausea, and/or vomiting). 30 tablet 1   No current facility-administered medications on file prior to visit.      Objective:     Vitals:   06/18/21 0913  BP: 109/77  Pulse: 87  Resp: 16   Filed Weights   06/18/21 0913  Weight: 128 lb 12.8 oz (58.4 kg)  Assessment:    No obstetric history on file. There are no problems to display for this patient.    1. Dysmenorrhea   2. Encounter for initial prescription of contraceptives, unspecified contraceptive   3. Initiation of OCP (BCP)     Patient has significant  dysmenorrhea occasionally causing vomiting and making her nonfunctional for that day.   Plan:            1.  OCPs The risks /benefits of OCPs have been explained to the patient in detail.  Product literature has been given to her where appropriate.  I have instructed her in the use of OCPs.  I have explained to the patient that OCPs are not as effective for birth control during the first month of use, and that another form of contraception should be used during this time.  Both first-day start and Sunday start have been explained.  The risks and benefits of each was discussed.  She has been made aware of  the fact that in rare circumstances, other medications may affect the efficacy of OCPs.  I have answered all of her questions, and I believe that she has an understanding of the effectiveness and use of OCPs. Patient has chosen [employer].  First day first day start 2.  Discussed the use of NSAID S with menses. 3.  Patient is annual examination and Pap smear.  We will do this at her follow-up for OCPs.  Orders Orders Placed This Encounter  Procedures   POCT urine pregnancy    No orders of the defined types were placed in this encounter.     F/U  Return in about 4 months (around 10/18/2021) for Annual Physical. I spent 32 minutes involved in the care of this patient preparing to see the patient by obtaining and reviewing her medical history (including labs, imaging tests and prior procedures), documenting clinical information in the electronic health record (EHR), counseling and coordinating care plans, writing and sending prescriptions, ordering tests or procedures and in direct communicating with the patient and medical staff discussing pertinent items from her history and physical exam.  Elonda Husky, M.D. 06/18/2021 9:38 AM

## 2021-06-18 NOTE — Telephone Encounter (Signed)
Called patient to inform her the medication was sent to [address] this morning. She is on her way to pick up medication and will call back if there is any concerns.

## 2021-06-19 ENCOUNTER — Telehealth: Payer: Self-pay | Admitting: Obstetrics and Gynecology

## 2021-06-19 NOTE — Telephone Encounter (Signed)
Pharmacy called and stated that the RX sent is not covered under her insurance for a 90 day supply.

## 2021-06-25 ENCOUNTER — Encounter: Payer: Self-pay | Admitting: Obstetrics and Gynecology

## 2021-06-28 ENCOUNTER — Other Ambulatory Visit: Payer: Self-pay

## 2021-06-28 ENCOUNTER — Encounter: Payer: Self-pay | Admitting: Emergency Medicine

## 2021-06-28 ENCOUNTER — Emergency Department
Admission: EM | Admit: 2021-06-28 | Discharge: 2021-06-28 | Disposition: A | Discharge status: Home / Self Care | Payer: BC Managed Care – PPO | Attending: Emergency Medicine | Admitting: Emergency Medicine

## 2021-06-28 DIAGNOSIS — A084 Viral intestinal infection, unspecified: Secondary | ICD-10-CM | POA: Insufficient documentation

## 2021-06-28 DIAGNOSIS — R109 Unspecified abdominal pain: Secondary | ICD-10-CM | POA: Diagnosis not present

## 2021-06-28 DIAGNOSIS — J45909 Unspecified asthma, uncomplicated: Secondary | ICD-10-CM | POA: Diagnosis not present

## 2021-06-28 DIAGNOSIS — K529 Noninfective gastroenteritis and colitis, unspecified: Secondary | ICD-10-CM

## 2021-06-28 LAB — URINALYSIS, COMPLETE (UACMP) WITH MICROSCOPIC
Bilirubin Urine: NEGATIVE
Glucose, UA: NEGATIVE mg/dL
Ketones, ur: NEGATIVE mg/dL
Leukocytes,Ua: NEGATIVE
Nitrite: NEGATIVE
Protein, ur: NEGATIVE mg/dL
Specific Gravity, Urine: 1.013 (ref 1.005–1.030)
pH: 6 (ref 5.0–8.0)

## 2021-06-28 LAB — COMPREHENSIVE METABOLIC PANEL
ALT: 24 U/L (ref 0–44)
AST: 22 U/L (ref 15–41)
Albumin: 4 g/dL (ref 3.5–5.0)
Alkaline Phosphatase: 39 U/L (ref 38–126)
Anion gap: 10 (ref 5–15)
BUN: 9 mg/dL (ref 6–20)
CO2: 20 mmol/L — ABNORMAL LOW (ref 22–32)
Calcium: 9.4 mg/dL (ref 8.9–10.3)
Chloride: 104 mmol/L (ref 98–111)
Creatinine, Ser: 0.64 mg/dL (ref 0.44–1.00)
GFR, Estimated: >60 mL/min (ref >60)
Glucose, Bld: 107 mg/dL — ABNORMAL HIGH (ref 70–99)
Potassium: 3.8 mmol/L (ref 3.5–5.1)
Sodium: 134 mmol/L — ABNORMAL LOW (ref 135–145)
Total Bilirubin: 0.7 mg/dL (ref 0.3–1.2)
Total Protein: 7.4 g/dL (ref 6.5–8.1)

## 2021-06-28 LAB — CBC
HCT: 36.2 % (ref 36.0–46.0)
Hemoglobin: 12.9 g/dL (ref 12.0–15.0)
MCH: 30.5 pg (ref 26.0–34.0)
MCHC: 35.6 g/dL (ref 30.0–36.0)
MCV: 85.6 fL (ref 80.0–100.0)
Platelets: 300 10*3/uL (ref 150–400)
RBC: 4.23 MIL/uL (ref 3.87–5.11)
RDW: 11.7 % (ref 11.5–15.5)
WBC: 7.7 10*3/uL (ref 4.0–10.5)
nRBC: 0 % (ref 0.0–0.2)

## 2021-06-28 LAB — LIPASE, BLOOD: Lipase: 30 U/L (ref 11–51)

## 2021-06-28 LAB — PREGNANCY, URINE: Preg Test, Ur: NEGATIVE

## 2021-06-28 MED ORDER — ONDANSETRON 4 MG PO TBDP
4.0000 mg | ORAL_TABLET | Freq: Once | ORAL | Status: AC
  Administered 2021-06-28: 4 mg via ORAL
  Filled 2021-06-28: qty 1

## 2021-06-28 MED ORDER — ONDANSETRON 4 MG PO TBDP: 4.0000 mg | ORAL_TABLET | Freq: Three times a day (TID) | ORAL | 0 refills | Status: DC | PRN

## 2021-06-28 NOTE — ED Triage Notes (Signed)
Pt to ED from home c/o epigastric abd pain, burning sensation, nausea and vomiting x2 tonight.  Pt started pill form birth control on Friday, denies urinary changes.  Pt A&Ox4, chest rise even and unlabored, in NAD at this time.

## 2021-06-28 NOTE — ED Notes (Signed)
See triage note. Pt states had N/V and burning epigastric abdominal pain starting last night around 11pm, and vomited 2 times around 0100. Also had 2 diarrheal episodes last night. Ambulatory to room. States has slight central epigastric pain, burning. States was diagnosed with peptic ulcer about 2 yr ago. Started birth control pill 6d ago. Denies pain, burning with urination.

## 2021-06-28 NOTE — ED Provider Notes (Signed)
[facility] Emergency Department Provider Note   ____________________________________________    I have reviewed the triage vital signs and the nursing notes.   HISTORY  Chief Complaint Nausea, Vomiting, and Abdominal Pain     HPI Lori Stevenson is a 25 y.o. female who presents with complaints of nausea vomiting diarrhea and abdominal cramping which started yesterday.  Patient reports she had been feeling well, developed nausea shortly followed by vomiting and then diarrhea.  She reports she had difficult time deciding which into place over the toilet.  No sick contacts reported.  Did have some chills.  Reports feeling better now after p.o. Zofran given in triage  Past Medical History:  Diagnosis Date   Asthma     There are no problems to display for this patient.   History reviewed. No pertinent surgical history.  Prior to Admission medications   Medication Sig Start Date End Date Taking? Authorizing Provider  ondansetron (ZOFRAN ODT) 4 MG disintegrating tablet Take 1 tablet (4 mg total) by mouth every 8 (eight) hours as needed. 06/28/21  Yes Jene Every, MD  albuterol (VENTOLIN HFA) 108 (90 Base) MCG/ACT inhaler Inhale 2 puffs into the lungs every 6 (six) hours as needed for wheezing or shortness of breath. 04/28/21   Delton See, MD  fluticasone [facility]) 50 MCG/ACT nasal spray Place 2 sprays into both nostrils daily. 04/28/21   Delton See, MD  Levonorgestrel-Ethinyl Estradiol (AMETHIA) 0.15-0.03 &0.01 MG tablet Take 1 tablet by mouth at bedtime. 06/18/21   Linzie Collin, MD  triamcinolone ointment (KENALOG) 0.1 % Apply 1 application topically 2 (two) times daily. 03/02/21   Shirlee Latch, PA-C  omeprazole (PRILOSEC OTC) 20 MG tablet Take 1 tablet (20 mg total) by mouth daily. 02/13/17 12/22/19  Loleta Rose, MD  promethazine (PHENERGAN) 25 MG tablet Take 25 mg by mouth every 6 (six) hours as needed for nausea or vomiting.  12/22/19   [provider]  sucralfate (CARAFATE) 1 g tablet Take 1 tablet (1 g total) by mouth 4 (four) times daily as needed (for abdominal discomfort, nausea, and/or vomiting). 02/13/17 12/22/19  Loleta Rose, MD     Allergies Patient has no known allergies.  Family History  Problem Relation Age of Onset   Thyroid disease Mother     Social History Social History   Tobacco Use   Smoking status: Never   Smokeless tobacco: Never  Vaping Use   Vaping Use: Never used  Substance Use Topics   Alcohol use: No   Drug use: No    Review of Systems  Constitutional: Chills as above Eyes: No visual changes.  ENT: No sore throat. Cardiovascular: Denies chest pain. Respiratory: Denies shortness of breath. Gastrointestinal: As above Genitourinary: Negative for dysuria. Musculoskeletal: Negative for back pain. Skin: Negative for rash. Neurological: Negative for headaches or weakness   ____________________________________________   PHYSICAL EXAM:  VITAL SIGNS: ED Triage Vitals [06/28/21 0452]  Enc Vitals Group     BP 118/76     Pulse Rate 65     Resp 16     Temp 98.5 F (36.9 C)     Temp Source Oral     SpO2 98 %     Weight 58.1 kg (128 lb)     Height 1.473 m (4\' 10" )     Head Circumference      Peak Flow      Pain Score 9     Pain Loc  Pain Edu?      Excl. in GC?     Constitutional: Alert and oriented. No acute distress. Pleasant and interactive Eyes: Conjunctivae are normal.  Head: Atraumatic. Nose: No congestion/rhinnorhea. Mouth/Throat: Mucous membranes are moist.   cardiovascular: sounds.  Good peripheral circulation. Respiratory: Normal respiratory effort.  No retractions.  Gastrointestinal: Soft and nontender. No distention.  No CVA tenderness.  Reassuring exam  Musculoskeletal: No lower extremity tenderness nor edema.  Warm and well perfused Neurologic:  Normal speech and language. No gross focal neurologic deficits are appreciated.  Skin:  Skin is  warm, dry and intact. No rash noted. Psychiatric: Mood and affect are normal. Speech and behavior are normal.  ____________________________________________   LABS (all labs ordered are listed, but only abnormal results are displayed)  Labs Reviewed  COMPREHENSIVE METABOLIC PANEL - Abnormal; Notable for the following components:      Result Value   Sodium 134 (*)    CO2 20 (*)    Glucose, Bld 107 (*)    All other components within normal limits  URINALYSIS, COMPLETE (UACMP) WITH MICROSCOPIC - Abnormal; Notable for the following components:   Color, Urine YELLOW (*)    APPearance HAZY (*)    Hgb urine dipstick SMALL (*)    Bacteria, UA RARE (*)    All other components within normal limits  LIPASE, BLOOD  CBC  PREGNANCY, URINE   ____________________________________________  EKG  None ____________________________________________  RADIOLOGY   ____________________________________________   PROCEDURES  Procedure(s) performed: No  Procedures   Critical Care performed: No ____________________________________________   INITIAL IMPRESSION / ASSESSMENT AND PLAN / ED COURSE  Pertinent labs & imaging results that were available during my care of the patient were reviewed by me and considered in my medical decision making (see chart for details).   Patient well-appearing and in no acute distress, abdominal exam is reassuring.  Vital signs are normal.  Lab work reviewed and is unremarkable.  Presentation is consistent with viral gastroenteritis.  Recommend symptomatic treatment, return precautions discussed.    ____________________________________________   FINAL CLINICAL IMPRESSION(S) / ED DIAGNOSES  Final diagnoses:  Gastroenteritis        Note:  This document was prepared using Dragon voice recognition software and may include unintentional dictation errors.    Jene Every, MD 06/28/21 [phone]

## 2021-06-28 NOTE — ED Notes (Signed)
EDP was at bedside. 

## 2021-07-07 ENCOUNTER — Other Ambulatory Visit: Payer: Self-pay

## 2021-07-07 ENCOUNTER — Ambulatory Visit
Admission: EM | Admit: 2021-07-07 | Discharge: 2021-07-07 | Disposition: A | Discharge status: Home / Self Care | Payer: BC Managed Care – PPO | Attending: Internal Medicine | Admitting: Internal Medicine

## 2021-07-07 DIAGNOSIS — R21 Rash and other nonspecific skin eruption: Secondary | ICD-10-CM

## 2021-07-07 DIAGNOSIS — R112 Nausea with vomiting, unspecified: Secondary | ICD-10-CM

## 2021-07-07 DIAGNOSIS — T63623A Toxic effect of contact with other jellyfish, assault, initial encounter: Secondary | ICD-10-CM | POA: Diagnosis not present

## 2021-07-07 MED ORDER — CYPROHEPTADINE HCL 4 MG PO TABS: 4.0000 mg | ORAL_TABLET | Freq: Three times a day (TID) | ORAL | 0 refills | Status: DC | PRN

## 2021-07-07 MED ORDER — METHYLPREDNISOLONE 4 MG PO TBPK: ORAL_TABLET | ORAL | 0 refills | Status: DC

## 2021-07-07 NOTE — ED Triage Notes (Signed)
Pt here with C/O rash on stomach, started upped stomach and has now spread, does itch at times, not painful, pt [place] by jelly fish. No other known bits or contact.

## 2021-07-07 NOTE — ED Provider Notes (Signed)
[facility]    CSN: [id number] Arrival date & time: 07/07/21  1258      History   Chief Complaint No chief complaint on file.   HPI Julya Alioto is a 25 y.o. female who presents with rash on abdomen which is spreading and itches at times on abdomen since today, and has been nauseous today as well and has had a few episodes of diarrhea. She denies  fatigue, myalgias, decreased appetite or URI symptoms. [place] on her [place]. Had to leave work early today very concerned is something serious and needs a work note. She denies use of any thing new topically or change in detergents or soaps. LMP 8/26    Past Medical History:  Diagnosis Date   Asthma     There are no problems to display for this patient.   History reviewed. No pertinent surgical history.  OB History   No obstetric history on file.      Home Medications    Prior to Admission medications   Medication Sig Start Date End Date Taking? Authorizing Provider  albuterol (VENTOLIN HFA) 108 (90 Base) MCG/ACT inhaler Inhale 2 puffs into the lungs every 6 (six) hours as needed for wheezing or shortness of breath. 04/28/21  Yes Delton See, MD  cyproheptadine (PERIACTIN) 4 MG tablet Take 1 tablet (4 mg total) by mouth 3 (three) times daily as needed for allergies. Prn itching 07/07/21  Yes Rodriguez-Southworth, Nettie Elm, PA-C  fluticasone (FLONASE) 50 MCG/ACT nasal spray Place 2 sprays into both nostrils daily. 04/28/21  Yes Delton See, MD  Levonorgestrel-Ethinyl Estradiol (AMETHIA) 0.15-0.03 &0.01 MG tablet Take 1 tablet by mouth at bedtime. 06/18/21  Yes Linzie Collin, MD  methylPREDNISolone (MEDROL DOSEPAK) 4 MG TBPK tablet Take as directed 07/07/21  Yes Rodriguez-Southworth, Nettie Elm, PA-C  omeprazole (PRILOSEC OTC) 20 MG tablet Take 1 tablet (20 mg total) by mouth daily. 02/13/17 12/22/19  Loleta Rose, MD  promethazine (PHENERGAN) 25 MG tablet Take 25 mg  by mouth every 6 (six) hours as needed for nausea or vomiting.  12/22/19  [provider]  sucralfate (CARAFATE) 1 g tablet Take 1 tablet (1 g total) by mouth 4 (four) times daily as needed (for abdominal discomfort, nausea, and/or vomiting). 02/13/17 12/22/19  Loleta Rose, MD    Family History Family History  Problem Relation Age of Onset   Thyroid disease Mother     Social History Social History   Tobacco Use   Smoking status: Never   Smokeless tobacco: Never  Vaping Use   Vaping Use: Never used  Substance Use Topics   Alcohol use: No   Drug use: No     Allergies   Patient has no known allergies.   Review of Systems Review of Systems  Constitutional:  Negative for activity change, appetite change, chills, diaphoresis, fatigue and fever.  HENT:  Negative for congestion, postnasal drip, rhinorrhea and trouble swallowing.   Respiratory:  Negative for cough and shortness of breath.   Gastrointestinal:  Positive for abdominal pain, diarrhea and nausea. Negative for constipation and vomiting.       Gets intermittent cramps before the diarrhea  Genitourinary:  Negative for dysuria, frequency and urgency.  Musculoskeletal:  Negative for gait problem, joint swelling and myalgias.  Skin:  Positive for rash.  Neurological:  Negative for headaches.  Hematological:  Negative for adenopathy.    Physical Exam Triage Vital Signs ED Triage Vitals  Enc Vitals Group     BP 07/07/21 1311 106/75     Pulse Rate 07/07/21 1311 76     Resp 07/07/21 1311 18     Temp 07/07/21 1311 98.6 F (37 C)     Temp Source 07/07/21 1311 Oral     SpO2 07/07/21 1311 100 %     Weight 07/07/21 1308 128 lb (58.1 kg)     Height 07/07/21 1308 4\' 10"  (1.473 m)     Head Circumference --      Peak Flow --      Pain Score 07/07/21 1308 0     Pain Loc --      Pain Edu? --      Excl. in GC? --    No data found.  Updated Vital Signs BP 106/75 (BP Location: Left Arm)   Pulse 76   Temp 98.6 F  (37 C) (Oral)   Resp 18   Ht 4\' 10"  (1.473 m)   Wt 128 lb (58.1 kg)   LMP 06/23/2021   SpO2 100%   BMI 26.75 kg/m   Visual Acuity Right Eye Distance:   Left Eye Distance:   Bilateral Distance:    Right Eye Near:   Left Eye Near:    Bilateral Near:     Physical Exam Vitals and nursing note reviewed.  Constitutional:      General: She is not in acute distress.    Appearance: She is not toxic-appearing.  HENT:     Head: Normocephalic.     Right Ear: External ear normal.     Left Ear: External ear normal.  Eyes:     General: No scleral icterus.    Conjunctiva/sclera: Conjunctivae normal.  Pulmonary:     Effort: Pulmonary effort is normal.  Abdominal:     General: Bowel sounds are normal. There is no distension.     Palpations: Abdomen is soft. There is no mass.     Tenderness: There is no abdominal tenderness. There is no guarding.  Musculoskeletal:        General: Normal range of motion.     Cervical back: Neck supple.  Skin:    General: Skin is warm and dry.     Findings: Rash present.     Comments: Flar macular pink rash on abdomen only, none on chest or back or arms.  Linear marks present on R posterior thigh and L inner thigh from the Jelly fish sting. Has a few papules around the one on the L thigh. It touched it provokes itching. Is not hot and does not look infected.   Neurological:     Mental Status: She is alert and oriented to person, place, and time.     Gait: Gait normal.  Psychiatric:        Mood and Affect: Mood normal.        Behavior: Behavior normal.        Thought Content: Thought content normal.        Judgment: Judgment normal.     UC Treatments / Results  Labs (all labs ordered are listed, but only abnormal results are displayed) Labs Reviewed - No data to display  EKG   Radiology No results found.  Procedures Procedures (including critical care time)  Medications Ordered in UC Medications - No data to display  Initial  Impression / Assessment and Plan / UC Course  I have reviewed the triage vital signs and the nursing notes. Rash on abdominal of  unknown etiology. Jelly fish local reaction on thighs. I placed her on Periactin, prednisone as noted. I believe the nausea and loose stools are from anxiety. No treatment for this at this time.      Final Clinical Impressions(s) / UC Diagnoses   Final diagnoses:  Rash and nonspecific skin eruption  Toxic effect of sting of jelly fish, assault, initial encounter  Non-intractable vomiting with nausea, unspecified vomiting type   Discharge Instructions   None    ED Prescriptions     Medication Sig Dispense Auth. Provider   methylPREDNISolone (MEDROL DOSEPAK) 4 MG TBPK tablet Take as directed 21 tablet Rodriguez-Southworth, Nettie Elm, PA-C   cyproheptadine (PERIACTIN) 4 MG tablet Take 1 tablet (4 mg total) by mouth 3 (three) times daily as needed for allergies. Prn itching 30 tablet Rodriguez-Southworth, Nettie Elm, PA-C      PDMP not reviewed this encounter.   Garey Ham, PA-C 07/07/21 1343

## 2021-08-21 ENCOUNTER — Other Ambulatory Visit: Payer: Self-pay

## 2021-08-21 ENCOUNTER — Ambulatory Visit
Admission: EM | Admit: 2021-08-21 | Discharge: 2021-08-21 | Disposition: A | Discharge status: Home / Self Care | Payer: BC Managed Care – PPO | Attending: Emergency Medicine | Admitting: Emergency Medicine

## 2021-08-21 DIAGNOSIS — N3001 Acute cystitis with hematuria: Secondary | ICD-10-CM | POA: Insufficient documentation

## 2021-08-21 LAB — PREGNANCY, URINE: Preg Test, Ur: NEGATIVE

## 2021-08-21 LAB — URINALYSIS, COMPLETE (UACMP) WITH MICROSCOPIC
Bilirubin Urine: NEGATIVE
Glucose, UA: NEGATIVE mg/dL
Ketones, ur: NEGATIVE mg/dL
Nitrite: NEGATIVE
Protein, ur: NEGATIVE mg/dL
Specific Gravity, Urine: 1.02 (ref 1.005–1.030)
pH: 6.5 (ref 5.0–8.0)

## 2021-08-21 MED ORDER — PHENAZOPYRIDINE HCL 200 MG PO TABS: 200.0000 mg | ORAL_TABLET | Freq: Three times a day (TID) | ORAL | 0 refills | Status: DC

## 2021-08-21 MED ORDER — NITROFURANTOIN MONOHYD MACRO 100 MG PO CAPS: 100.0000 mg | ORAL_CAPSULE | Freq: Two times a day (BID) | ORAL | 0 refills | Status: DC

## 2021-08-21 NOTE — Discharge Instructions (Addendum)

## 2021-08-21 NOTE — ED Provider Notes (Signed)
Orders [facility]    CSN: [id number] Arrival date & time: 08/21/21  1742      History   Chief Complaint Chief Complaint  Patient presents with   Dysuria    HPI Lori Stevenson is a 25 y.o. female.   HPI  25 year old female here for evaluation of urinary complaints.  Has been experiencing painful urination with urinary frequency for the last 2 weeks and then noticed blood when she wiped yesterday.  This has been coupled with some intermittent nausea and suprapubic pain.  She also endorses low back pain and cloudiness to her urine.  Patient denies any fever, vaginal discharge or vaginal itching.  Patient is requesting a pregnancy test because she missed several days of her birth control and she has frequent unprotected sex with her partner.  She denies any concern over STIs.  Past Medical History:  Diagnosis Date   Asthma     There are no problems to display for this patient.   History reviewed. No pertinent surgical history.  OB History   No obstetric history on file.      Home Medications    Prior to Admission medications   Medication Sig Start Date End Date Taking? Authorizing Provider  Levonorgestrel-Ethinyl Estradiol (AMETHIA) 0.15-0.03 &0.01 MG tablet Take 1 tablet by mouth at bedtime. 06/18/21  Yes Linzie Collin, MD  nitrofurantoin, macrocrystal-monohydrate, (MACROBID) 100 MG capsule Take 1 capsule (100 mg total) by mouth 2 (two) times daily. 08/21/21  Yes Becky Augusta, NP  phenazopyridine (PYRIDIUM) 200 MG tablet Take 1 tablet (200 mg total) by mouth 3 (three) times daily. 08/21/21  Yes Becky Augusta, NP  albuterol (VENTOLIN HFA) 108 (90 Base) MCG/ACT inhaler Inhale 2 puffs into the lungs every 6 (six) hours as needed for wheezing or shortness of breath. 04/28/21   Delton See, MD  cyproheptadine (PERIACTIN) 4 MG tablet Take 1 tablet (4 mg total) by mouth 3 (three) times daily as needed for allergies. Prn itching 07/07/21    Rodriguez-Southworth, Nettie Elm, PA-C  fluticasone (FLONASE) 50 MCG/ACT nasal spray Place 2 sprays into both nostrils daily. 04/28/21   Delton See, MD  omeprazole (PRILOSEC OTC) 20 MG tablet Take 1 tablet (20 mg total) by mouth daily. 02/13/17 12/22/19  Loleta Rose, MD  promethazine (PHENERGAN) 25 MG tablet Take 25 mg by mouth every 6 (six) hours as needed for nausea or vomiting.  12/22/19  [provider]  sucralfate (CARAFATE) 1 g tablet Take 1 tablet (1 g total) by mouth 4 (four) times daily as needed (for abdominal discomfort, nausea, and/or vomiting). 02/13/17 12/22/19  Loleta Rose, MD    Family History Family History  Problem Relation Age of Onset   Thyroid disease Mother     Social History Social History   Tobacco Use   Smoking status: Never   Smokeless tobacco: Never  Vaping Use   Vaping Use: Never used  Substance Use Topics   Alcohol use: No   Drug use: No     Allergies   Patient has no known allergies.   Review of Systems Review of Systems  Constitutional:  Negative for activity change, appetite change and fever.  Gastrointestinal:  Positive for abdominal pain and nausea. Negative for diarrhea and vomiting.  Genitourinary:  Positive for dysuria, frequency and urgency. Negative for hematuria, vaginal discharge and vaginal pain.  Musculoskeletal:  Positive for back pain.  Skin:  Negative for rash.  Hematological: Negative.   Psychiatric/Behavioral: Negative.      Physical Exam  Triage Vital Signs ED Triage Vitals  Enc Vitals Group     BP 08/21/21 1821 106/82     Pulse Rate 08/21/21 1821 75     Resp 08/21/21 1821 18     Temp 08/21/21 1821 99.2 F (37.3 C)     Temp Source 08/21/21 1821 Oral     SpO2 08/21/21 1821 100 %     Weight 08/21/21 1819 123 lb (55.8 kg)     Height 08/21/21 1819 4\' 10"  (1.473 m)     Head Circumference --      Peak Flow --      Pain Score 08/21/21 1818 6     Pain Loc --      Pain Edu? --      Excl. in GC? --    No data  found.  Updated Vital Signs BP 106/82 (BP Location: Left Arm)   Pulse 75   Temp 99.2 F (37.3 C) (Oral)   Resp 18   Ht 4\' 10"  (1.473 m)   Wt 123 lb (55.8 kg)   SpO2 100%   BMI 25.71 kg/m   Visual Acuity Right Eye Distance:   Left Eye Distance:   Bilateral Distance:    Right Eye Near:   Left Eye Near:    Bilateral Near:     Physical Exam Vitals and nursing note reviewed.  Constitutional:      General: She is not in acute distress.    Appearance: Normal appearance. She is normal weight. She is not ill-appearing.  HENT:     Head: Normocephalic and atraumatic.  Cardiovascular:     Rate and Rhythm: Normal rate and regular rhythm.     Pulses: Normal pulses.     Heart sounds: Normal heart sounds. No murmur heard.   No gallop.  Pulmonary:     Effort: Pulmonary effort is normal.     Breath sounds: Normal breath sounds. No wheezing, rhonchi or rales.  Abdominal:     General: Abdomen is flat.     Palpations: Abdomen is soft.     Tenderness: There is abdominal tenderness. There is no right CVA tenderness, left CVA tenderness, guarding or rebound.  Skin:    General: Skin is warm and dry.     Capillary Refill: Capillary refill takes less than 2 seconds.     Findings: No erythema or rash.  Neurological:     General: No focal deficit present.     Mental Status: She is alert and oriented to person, place, and time.  Psychiatric:        Mood and Affect: Mood normal.        Behavior: Behavior normal.        Thought Content: Thought content normal.        Judgment: Judgment normal.     UC Treatments / Results  Labs (all labs ordered are listed, but only abnormal results are displayed) Labs Reviewed  URINALYSIS, COMPLETE (UACMP) WITH MICROSCOPIC - Abnormal; Notable for the following components:      Result Value   APPearance HAZY (*)    Hgb urine dipstick TRACE (*)    Leukocytes,Ua TRACE (*)    Bacteria, UA FEW (*)    All other components within normal limits  URINE  CULTURE  PREGNANCY, URINE    EKG   Radiology No results found.  Procedures Procedures (including critical care time)  Medications Ordered in UC Medications - No data to display  Initial Impression / Assessment and Plan /  UC Course  I have reviewed the triage vital signs and the nursing notes.  Pertinent labs & imaging results that were available during my care of the patient were reviewed by me and considered in my medical decision making (see chart for details).  Patient is a nontoxic-appearing 25 year old female here for evaluation of urinary complaints as outlined HPI above.  Patient's physical exam reveals a benign cardiopulmonary exam with clear lung sounds in all fields.  No CVA tenderness on exam.  Abdomen is soft and flat with positive bowel sounds in all 4 quadrants.  Patient has mild suprapubic tenderness but no other tenderness.  No guarding or rebound.  Patient is also requesting a pregnancy test as she has missed several days of her birth control and is frequently unprotected sex with her partner.  No concern for STIs per her report.  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  We will check UA and U preg.  Urine pregnancy test is negative.  Urinalysis shows hazy appearance, trace hemoglobin, trace leukocytes, few bacteria.  16 squamous epithelials and 6-10 WBCs.  The urine appears to be a contaminated specimen.  We will send sample for culture.  We will treat patient with Macrobid twice daily for 5 days and Pyridium for urinary discomfort pending the culture results.   Final Clinical Impressions(s) / UC Diagnoses   Final diagnoses:  Acute cystitis with hematuria     Discharge Instructions      Take the Macrobid twice daily for 5 days with food for treatment of urinary tract infection.  Use the Pyridium every 8 hours as needed for urinary discomfort.  This will turn your urine a bright  red-orange.  Increase your oral fluid intake so that you increase your urine production and or flushing your urinary system.  Take an over-the-counter probiotic, such as Culturelle-Align-Activia, 1 hour after each dose of antibiotic to prevent diarrhea or yeast infections from forming.  We will culture urine and change the antibiotics if necessary.  Return for reevaluation, or see your primary care provider, for any new or worsening symptoms.      ED Prescriptions     Medication Sig Dispense Auth. Provider   nitrofurantoin, macrocrystal-monohydrate, (MACROBID) 100 MG capsule Take 1 capsule (100 mg total) by mouth 2 (two) times daily. 10 capsule Becky Augusta, NP   phenazopyridine (PYRIDIUM) 200 MG tablet Take 1 tablet (200 mg total) by mouth 3 (three) times daily. 6 tablet Becky Augusta, NP      PDMP not reviewed this encounter.   Becky Augusta, NP 08/21/21 1907

## 2021-08-21 NOTE — ED Triage Notes (Signed)
Pt c/o dysuria and urinary frequency for about 2 weeks. Pt also reports some blood when wiping. Pt states she got an OTC UTI test and it was positive. Pt denies f/n/v/d, has had some lower abd pain.

## 2021-08-23 ENCOUNTER — Encounter: Payer: Self-pay | Admitting: Emergency Medicine

## 2021-08-23 ENCOUNTER — Other Ambulatory Visit: Payer: Self-pay

## 2021-08-23 ENCOUNTER — Ambulatory Visit
Admission: EM | Admit: 2021-08-23 | Discharge: 2021-08-23 | Disposition: A | Discharge status: Home / Self Care | Payer: BC Managed Care – PPO | Attending: Physician Assistant | Admitting: Physician Assistant

## 2021-08-23 DIAGNOSIS — R300 Dysuria: Secondary | ICD-10-CM | POA: Diagnosis not present

## 2021-08-23 DIAGNOSIS — N898 Other specified noninflammatory disorders of vagina: Secondary | ICD-10-CM

## 2021-08-23 DIAGNOSIS — N760 Acute vaginitis: Secondary | ICD-10-CM

## 2021-08-23 DIAGNOSIS — B9689 Other specified bacterial agents as the cause of diseases classified elsewhere: Secondary | ICD-10-CM | POA: Diagnosis not present

## 2021-08-23 LAB — URINALYSIS, COMPLETE (UACMP) WITH MICROSCOPIC
Bacteria, UA: NONE SEEN
Bilirubin Urine: NEGATIVE
Glucose, UA: NEGATIVE mg/dL
Hgb urine dipstick: NEGATIVE
Ketones, ur: NEGATIVE mg/dL
Leukocytes,Ua: NEGATIVE
Nitrite: NEGATIVE
Protein, ur: NEGATIVE mg/dL
Specific Gravity, Urine: 1.02 (ref 1.005–1.030)
pH: 7.5 (ref 5.0–8.0)

## 2021-08-23 LAB — WET PREP, GENITAL
Sperm: NONE SEEN
Trich, Wet Prep: NONE SEEN
Yeast Wet Prep HPF POC: NONE SEEN

## 2021-08-23 LAB — CHLAMYDIA/NGC RT PCR (ARMC ONLY)
Chlamydia Tr: NOT DETECTED
N gonorrhoeae: NOT DETECTED

## 2021-08-23 LAB — URINE CULTURE

## 2021-08-23 MED ORDER — METRONIDAZOLE 500 MG PO TABS: 500.0000 mg | ORAL_TABLET | Freq: Two times a day (BID) | ORAL | 0 refills | Status: AC

## 2021-08-23 NOTE — Discharge Instructions (Addendum)
-  You have bacterial vaginosis.  This could be the cause for your symptoms.  I have sent metronidazole.  Complete full course of this antibiotic. -Your urinalysis today is normal but I have sent another culture.  This will take about 2 days but if the culture grows any bacteria we can send a different antibiotic for you to treat UTI, but you may not have a UTI.  Increase rest and fluid intake. -We have also tested you for gonorrhea and chlamydia.  There is only back a little later today and we will call you if positive.  All results will be available on MyChart.

## 2021-08-23 NOTE — ED Triage Notes (Signed)
Pt was seen here on 08/21/21 and was dx UTI and given antibiotics. Today you received a call suggesting she stop the antibiotic. She is here today to be re-evaluated. She c/o of vaginal irritation and lower abdominal pain.

## 2021-08-23 NOTE — ED Provider Notes (Signed)
[facility]    CSN: [id number] Arrival date & time: 08/23/21  1044      History   Chief Complaint Chief Complaint  Patient presents with   vaginal irritation   Abdominal Pain    HPI Lori Stevenson is a 25 y.o. female returning for reevaluation.  Patient seen 2 days ago for urinary symptoms including dysuria and urinary frequency for the past 2 weeks.  Diagnosed with UTI at that time.  Patient started on Macrobid.  Patient called today and advised she did not have a UTI based on culture.  Culture shows mixed species and lab suggested recollection.  Patient reports having improvement in her dysuria since starting the Macrobid.  She does however report that she has some vaginal irritation and a small amount of discharge.  Patient also admits to left lower quadrant discomfort and diarrhea for the past few days.  She denies any fever, fatigue, chills, back pain, hematuria.  Patient is sexually active with 1 female partner.  She is on oral contraceptives and says she gets a period once every 3 months.  She says her last menstrual period was 2 months ago.  She did have a pregnancy test 2 days ago at our clinic and it was negative.  She does report unprotected intercourse with her partner but denies any concern for STIs.  Patient was tested for STIs at last visit.  Of note, patient is taking the Pyridium that she was prescribed at last visit.  No other complaints.  HPI  Past Medical History:  Diagnosis Date   Asthma     There are no problems to display for this patient.   History reviewed. No pertinent surgical history.  OB History   No obstetric history on file.      Home Medications    Prior to Admission medications   Medication Sig Start Date End Date Taking? Authorizing Provider  metroNIDAZOLE (FLAGYL) 500 MG tablet Take 1 tablet (500 mg total) by mouth 2 (two) times daily for 7 days. 08/23/21 08/30/21 Yes Shirlee Latch, PA-C  albuterol (VENTOLIN HFA) 108 (90  Base) MCG/ACT inhaler Inhale 2 puffs into the lungs every 6 (six) hours as needed for wheezing or shortness of breath. 04/28/21   Delton See, MD  cyproheptadine (PERIACTIN) 4 MG tablet Take 1 tablet (4 mg total) by mouth 3 (three) times daily as needed for allergies. Prn itching 07/07/21   Rodriguez-Southworth, Nettie Elm, PA-C  fluticasone (FLONASE) 50 MCG/ACT nasal spray Place 2 sprays into both nostrils daily. 04/28/21   Delton See, MD  Levonorgestrel-Ethinyl Estradiol (AMETHIA) 0.15-0.03 &0.01 MG tablet Take 1 tablet by mouth at bedtime. 06/18/21   Linzie Collin, MD  nitrofurantoin, macrocrystal-monohydrate, (MACROBID) 100 MG capsule Take 1 capsule (100 mg total) by mouth 2 (two) times daily. 08/21/21   Becky Augusta, NP  phenazopyridine (PYRIDIUM) 200 MG tablet Take 1 tablet (200 mg total) by mouth 3 (three) times daily. 08/21/21   Becky Augusta, NP  omeprazole (PRILOSEC OTC) 20 MG tablet Take 1 tablet (20 mg total) by mouth daily. 02/13/17 12/22/19  Loleta Rose, MD  promethazine (PHENERGAN) 25 MG tablet Take 25 mg by mouth every 6 (six) hours as needed for nausea or vomiting.  12/22/19  [provider]  sucralfate (CARAFATE) 1 g tablet Take 1 tablet (1 g total) by mouth 4 (four) times daily as needed (for abdominal discomfort, nausea, and/or vomiting). 02/13/17 12/22/19  Loleta Rose, MD    Family History Family History  Problem  Relation Age of Onset   Thyroid disease Mother     Social History Social History   Tobacco Use   Smoking status: Never   Smokeless tobacco: Never  Vaping Use   Vaping Use: Never used  Substance Use Topics   Alcohol use: No   Drug use: No     Allergies   Patient has no known allergies.   Review of Systems Review of Systems  Constitutional:  Negative for fatigue and fever.  Gastrointestinal:  Positive for abdominal pain and diarrhea. Negative for nausea and vomiting.  Genitourinary:  Positive for dysuria, frequency, urgency and vaginal  discharge. Negative for flank pain, hematuria, vaginal bleeding and vaginal pain.  Musculoskeletal:  Negative for back pain.  Skin:  Negative for rash.    Physical Exam Triage Vital Signs ED Triage Vitals  Enc Vitals Group     BP      Pulse      Resp      Temp      Temp src      SpO2      Weight      Height      Head Circumference      Peak Flow      Pain Score      Pain Loc      Pain Edu?      Excl. in GC?    No data found.  Updated Vital Signs BP 109/73   Pulse 83   Temp 98.7 F (37.1 C)   Resp 17   LMP 06/23/2021 Comment: with irreg periods  SpO2 99%     Physical Exam Vitals and nursing note reviewed.  Constitutional:      General: She is not in acute distress.    Appearance: Normal appearance. She is well-developed. She is not ill-appearing or toxic-appearing.  HENT:     Head: Normocephalic and atraumatic.  Eyes:     General: No scleral icterus.       Right eye: No discharge.        Left eye: No discharge.     Conjunctiva/sclera: Conjunctivae normal.  Cardiovascular:     Rate and Rhythm: Normal rate and regular rhythm.     Heart sounds: Normal heart sounds.  Pulmonary:     Effort: Pulmonary effort is normal. No respiratory distress.     Breath sounds: Normal breath sounds.  Abdominal:     Palpations: Abdomen is soft.     Tenderness: There is abdominal tenderness in the left lower quadrant. There is no right CVA tenderness or left CVA tenderness.  Musculoskeletal:     Cervical back: Neck supple.  Skin:    General: Skin is dry.  Neurological:     General: No focal deficit present.     Mental Status: She is alert. Mental status is at baseline.     Motor: No weakness.     Gait: Gait normal.  Psychiatric:        Mood and Affect: Mood normal.        Behavior: Behavior normal.        Thought Content: Thought content normal.     UC Treatments / Results  Labs (all labs ordered are listed, but only abnormal results are displayed) Labs Reviewed   WET PREP, GENITAL - Abnormal; Notable for the following components:      Result Value   Clue Cells Wet Prep HPF POC PRESENT (*)    WBC, Wet Prep HPF POC FEW (*)  All other components within normal limits  URINE CULTURE  CHLAMYDIA/NGC RT PCR ([facility] ONLY)            URINALYSIS, COMPLETE (UACMP) WITH MICROSCOPIC    EKG   Radiology No results found.  Procedures Procedures (including critical care time)  Medications Ordered in UC Medications - No data to display  Initial Impression / Assessment and Plan / UC Course  I have reviewed the triage vital signs and the nursing notes.  Pertinent labs & imaging results that were available during my care of the patient were reviewed by me and considered in my medical decision making (see chart for details).  25 year old female presenting for 2-week history of dysuria and urinary frequency with new onset vaginal irritation and small amount of vaginal discharge in the past couple of days.  Patient seen in clinic 2 days ago.  Urine culture showed mixed bacteria and advised recollection.  Patient admits improved symptoms on Macrobid and Pyridium.  Presents to clinic today because she was advised to be reevaluated given the urine findings.  Vitals normal and stable today.  On exam she has mild left lower quadrant abdominal tenderness but no CVA tenderness.  Obtained today: Urinalysis, urine culture, wet prep and GC/chlamydia.  UA is normal.  We will send culture to make sure she does not have a UTI given her symptoms. Wet prep positive for clue cells.  I have sent metronidazole to pharmacy. GC/chlamydia test pending.  Advised patient if she is positive we will contact her and recommend treatment.  Advised patient increase rest and fluid intake and follow-up for any worsening of symptoms.  Work note given.  Final Clinical Impressions(s) / UC Diagnoses   Final diagnoses:  Bacterial vaginosis  Dysuria  Vaginal discharge     Discharge  Instructions      -You have bacterial vaginosis.  This could be the cause for your symptoms.  I have sent metronidazole.  Complete full course of this antibiotic. -Your urinalysis today is normal but I have sent another culture.  This will take about 2 days but if the culture grows any bacteria we can send a different antibiotic for you to treat UTI, but you may not have a UTI.  Increase rest and fluid intake. -We have also tested you for gonorrhea and chlamydia.  There is only back a little later today and we will call you if positive.  All results will be available on MyChart.     ED Prescriptions     Medication Sig Dispense Auth. Provider   metroNIDAZOLE (FLAGYL) 500 MG tablet Take 1 tablet (500 mg total) by mouth 2 (two) times daily for 7 days. 14 tablet Gareth Morgan      PDMP not reviewed this encounter.   Shirlee Latch, PA-C 08/23/21 1235

## 2021-08-25 LAB — URINE CULTURE: Culture: NO GROWTH

## 2021-10-02 ENCOUNTER — Encounter: Payer: Self-pay | Admitting: Emergency Medicine

## 2021-10-02 ENCOUNTER — Ambulatory Visit
Admission: EM | Admit: 2021-10-02 | Discharge: 2021-10-02 | Disposition: A | Discharge status: Home / Self Care | Payer: BC Managed Care – PPO | Attending: Emergency Medicine | Admitting: Emergency Medicine

## 2021-10-02 ENCOUNTER — Other Ambulatory Visit: Payer: Self-pay

## 2021-10-02 DIAGNOSIS — J069 Acute upper respiratory infection, unspecified: Secondary | ICD-10-CM

## 2021-10-02 DIAGNOSIS — J4521 Mild intermittent asthma with (acute) exacerbation: Secondary | ICD-10-CM | POA: Diagnosis not present

## 2021-10-02 MED ORDER — PREDNISONE 20 MG PO TABS: 60.0000 mg | ORAL_TABLET | Freq: Every day | ORAL | 0 refills | Status: AC

## 2021-10-02 MED ORDER — AMOXICILLIN-POT CLAVULANATE 875-125 MG PO TABS: 1.0000 | ORAL_TABLET | Freq: Two times a day (BID) | ORAL | 0 refills | Status: AC

## 2021-10-02 MED ORDER — PROMETHAZINE-DM 6.25-15 MG/5ML PO SYRP: 5.0000 mL | ORAL_SOLUTION | Freq: Four times a day (QID) | ORAL | 0 refills | Status: DC | PRN

## 2021-10-02 MED ORDER — BENZONATATE 100 MG PO CAPS: 200.0000 mg | ORAL_CAPSULE | Freq: Three times a day (TID) | ORAL | 0 refills | Status: DC

## 2021-10-02 MED ORDER — ALBUTEROL SULFATE (2.5 MG/3ML) 0.083% IN NEBU: 2.5000 mg | INHALATION_SOLUTION | Freq: Four times a day (QID) | RESPIRATORY_TRACT | 12 refills | Status: DC | PRN

## 2021-10-02 NOTE — ED Provider Notes (Signed)
[facility]    CSN: [id number] Arrival date & time: 10/02/21  1709      History   Chief Complaint Chief Complaint  Patient presents with   Fever   Cough    HPI Lori Stevenson is a 25 y.o. female.   HPI  Patient is a nontoxic-appearing 26 year old female here for evaluation of respiratory complaints.  Patient ports that for last week she has had a subjective fever, nasal congestion and runny nose, chills and sweats, sore throat, cough that is intermittent productive yellow sputum, and wheezing.  She also endorses some mild ear pain.  She denies shortness of breath or GI complaints.  She does have a history of asthma and she has been using her inhaler with limited relief.  She is requesting nebulizer solution as she states that this works better for her when she is having asthma flares.  Past Medical History:  Diagnosis Date   Asthma     There are no problems to display for this patient.   History reviewed. No pertinent surgical history.  OB History   No obstetric history on file.      Home Medications    Prior to Admission medications   Medication Sig Start Date End Date Taking? Authorizing Provider  albuterol (PROVENTIL) (2.5 MG/3ML) 0.083% nebulizer solution Take 3 mLs (2.5 mg total) by nebulization every 6 (six) hours as needed for wheezing or shortness of breath. 10/02/21  Yes Becky Augusta, NP  albuterol (VENTOLIN HFA) 108 (90 Base) MCG/ACT inhaler Inhale 2 puffs into the lungs every 6 (six) hours as needed for wheezing or shortness of breath. 04/28/21  Yes Delton See, MD  amoxicillin-clavulanate (AUGMENTIN) 875-125 MG tablet Take 1 tablet by mouth every 12 (twelve) hours for 10 days. 10/02/21 10/12/21 Yes Becky Augusta, NP  benzonatate (TESSALON) 100 MG capsule Take 2 capsules (200 mg total) by mouth every 8 (eight) hours. 10/02/21  Yes Becky Augusta, NP  fluticasone (FLONASE) 50 MCG/ACT nasal spray Place 2 sprays into both nostrils daily.  04/28/21  Yes Delton See, MD  Levonorgestrel-Ethinyl Estradiol (AMETHIA) 0.15-0.03 &0.01 MG tablet Take 1 tablet by mouth at bedtime. 06/18/21  Yes Linzie Collin, MD  predniSONE (DELTASONE) 20 MG tablet Take 3 tablets (60 mg total) by mouth daily with breakfast for 5 days. 3 tablets daily for 5 days. 10/02/21 10/07/21 Yes Becky Augusta, NP  promethazine-dextromethorphan (PROMETHAZINE-DM) 6.25-15 MG/5ML syrup Take 5 mLs by mouth 4 (four) times daily as needed. 10/02/21  Yes Becky Augusta, NP  omeprazole (PRILOSEC OTC) 20 MG tablet Take 1 tablet (20 mg total) by mouth daily. 02/13/17 12/22/19  Loleta Rose, MD  promethazine (PHENERGAN) 25 MG tablet Take 25 mg by mouth every 6 (six) hours as needed for nausea or vomiting.  12/22/19  [provider]  sucralfate (CARAFATE) 1 g tablet Take 1 tablet (1 g total) by mouth 4 (four) times daily as needed (for abdominal discomfort, nausea, and/or vomiting). 02/13/17 12/22/19  Loleta Rose, MD    Family History Family History  Problem Relation Age of Onset   Thyroid disease Mother     Social History Social History   Tobacco Use   Smoking status: Never   Smokeless tobacco: Never  Vaping Use   Vaping Use: Never used  Substance Use Topics   Alcohol use: No   Drug use: No     Allergies   Patient has no known allergies.   Review of Systems Review of Systems  Constitutional:  Positive for  chills, diaphoresis and fever. Negative for activity change and appetite change.  HENT:  Positive for congestion, ear pain, rhinorrhea and sore throat.   Respiratory:  Positive for cough and wheezing. Negative for shortness of breath.   Gastrointestinal:  Negative for diarrhea, nausea and vomiting.  Hematological: Negative.     Physical Exam Triage Vital Signs ED Triage Vitals  Enc Vitals Group     BP 10/02/21 1959 (!) 155/99     Pulse Rate 10/02/21 1959 84     Resp 10/02/21 1959 18     Temp 10/02/21 1959 99.9 F (37.7 C)     Temp Source  10/02/21 1959 Oral     SpO2 10/02/21 1959 99 %     Weight 10/02/21 1956 123 lb 0.3 oz (55.8 kg)     Height 10/02/21 1956 4\' 10"  (1.473 m)     Head Circumference --      Peak Flow --      Pain Score 10/02/21 1956 7     Pain Loc --      Pain Edu? --      Excl. in GC? --    No data found.  Updated Vital Signs BP (!) 155/99 (BP Location: Left Arm)   Pulse 84   Temp 99.9 F (37.7 C) (Oral)   Resp 18   Ht 4\' 10"  (1.473 m)   Wt 123 lb 0.3 oz (55.8 kg)   LMP 09/11/2021 (Approximate)   SpO2 99%   BMI 25.71 kg/m   Visual Acuity Right Eye Distance:   Left Eye Distance:   Bilateral Distance:    Right Eye Near:   Left Eye Near:    Bilateral Near:     Physical Exam Vitals and nursing note reviewed.  Constitutional:      General: She is not in acute distress.    Appearance: Normal appearance. She is not ill-appearing.  HENT:     Head: Normocephalic and atraumatic.     Right Ear: Tympanic membrane, ear canal and external ear normal. There is no impacted cerumen.     Left Ear: Tympanic membrane, ear canal and external ear normal. There is no impacted cerumen.     Nose: Congestion and rhinorrhea present.     Mouth/Throat:     Mouth: Mucous membranes are moist.     Pharynx: Oropharynx is clear. Posterior oropharyngeal erythema present.  Cardiovascular:     Rate and Rhythm: Normal rate and regular rhythm.     Pulses: Normal pulses.     Heart sounds: Normal heart sounds. No murmur heard.   No gallop.  Pulmonary:     Effort: Pulmonary effort is normal.     Breath sounds: Normal breath sounds. No wheezing, rhonchi or rales.  Musculoskeletal:     Cervical back: Normal range of motion and neck supple.  Lymphadenopathy:     Cervical: No cervical adenopathy.  Skin:    General: Skin is warm and dry.     Capillary Refill: Capillary refill takes less than 2 seconds.     Findings: No erythema or rash.  Neurological:     General: No focal deficit present.     Mental Status: She is  alert and oriented to person, place, and time.  Psychiatric:        Mood and Affect: Mood normal.        Behavior: Behavior normal.        Thought Content: Thought content normal.        Judgment:  Judgment normal.     UC Treatments / Results  Labs (all labs ordered are listed, but only abnormal results are displayed) Labs Reviewed - No data to display  EKG   Radiology No results found.  Procedures Procedures (including critical care time)  Medications Ordered in UC Medications - No data to display  Initial Impression / Assessment and Plan / UC Course  I have reviewed the triage vital signs and the nursing notes.  Pertinent labs & imaging results that were available during my care of the patient were reviewed by me and considered in my medical decision making (see chart for details).  Patient is a nontoxic-appearing 25 year old female here for evaluation respiratory complaints as outlined HPI above.  Patient's physical exam reveals pearly-gray tympanic membranes bilaterally with normal light reflex and clear external auditory canals.  Nasal mucosa is erythematous and edematous with clear nasal discharge in both nares.  Oropharyngeal exam reveals posterior oropharyngeal erythema with clear postnasal drip.  No cervical lymphadenopathy appreciated exam.  Cardiopulmonary exam reveals clear lung sounds in all fields.  Patient exam is consistent with an upper respiratory faction and asthma exacerbation.  Given patient's upper respiratory infection status she may very well have been suffering from influenza but she has had symptoms for a week and she is outside the treatment paradigm for Tamiflu.  We will treat her asthma exacerbation with prednisone and albuterol and treat the upper respiratory infection with Tessalon Perles, and Promethazine DM cough syrup.   Final Clinical Impressions(s) / UC Diagnoses   Final diagnoses:  Upper respiratory tract infection, unspecified type  Mild  intermittent asthma with acute exacerbation     Discharge Instructions      Take the Augmentin twice daily with food for treatment of your URI.  Use your albuterol inhaler/nebulizer every 4-6 hours as needed for shortness of breath or wheezing.  Start the prednisone tomorrow morning and take each morning with breakfast for 5 days.   Use the Tessalon Perles every 8 hours during the day.  Take them with a small sip of water.  They may give you some numbness to the base of your tongue or a metallic taste in your mouth, this is normal.  Use the Promethazine DM cough syrup at bedtime for cough and congestion.  It will make you drowsy so do not take it during the day.  Return for reevaluation or see your primary care provider for any new or worsening symptoms.      ED Prescriptions     Medication Sig Dispense Auth. Provider   albuterol (PROVENTIL) (2.5 MG/3ML) 0.083% nebulizer solution Take 3 mLs (2.5 mg total) by nebulization every 6 (six) hours as needed for wheezing or shortness of breath. 75 mL Becky Augusta, NP   amoxicillin-clavulanate (AUGMENTIN) 875-125 MG tablet Take 1 tablet by mouth every 12 (twelve) hours for 10 days. 20 tablet Becky Augusta, NP   benzonatate (TESSALON) 100 MG capsule Take 2 capsules (200 mg total) by mouth every 8 (eight) hours. 21 capsule Becky Augusta, NP   promethazine-dextromethorphan (PROMETHAZINE-DM) 6.25-15 MG/5ML syrup Take 5 mLs by mouth 4 (four) times daily as needed. 118 mL Becky Augusta, NP   predniSONE (DELTASONE) 20 MG tablet Take 3 tablets (60 mg total) by mouth daily with breakfast for 5 days. 3 tablets daily for 5 days. 15 tablet Becky Augusta, NP      PDMP not reviewed this encounter.   Becky Augusta, NP 10/02/21 2054

## 2021-10-02 NOTE — Discharge Instructions (Signed)
Take the Augmentin twice daily with food for treatment of your URI.  Use your albuterol inhaler/nebulizer every 4-6 hours as needed for shortness of breath or wheezing.  Start the prednisone tomorrow morning and take each morning with breakfast for 5 days.   Use the Tessalon Perles every 8 hours during the day.  Take them with a small sip of water.  They may give you some numbness to the base of your tongue or a metallic taste in your mouth, this is normal.  Use the Promethazine DM cough syrup at bedtime for cough and congestion.  It will make you drowsy so do not take it during the day.  Return for reevaluation or see your primary care provider for any new or worsening symptoms.

## 2021-10-02 NOTE — ED Triage Notes (Signed)
Pt c/o chest tightness, subjective fever, cough, nasal congestion and runny nose. Started about a week ago. Pt has known asthma.

## 2021-10-18 ENCOUNTER — Encounter: Payer: BC Managed Care – PPO | Admitting: Obstetrics and Gynecology

## 2021-10-18 ENCOUNTER — Encounter: Payer: Self-pay | Admitting: Obstetrics and Gynecology

## 2021-11-04 NOTE — L&D Delivery Note (Addendum)
Delivery Note   Lori Stevenson is a 26 y.o. G1P0 at [redacted]w[redacted]d Estimated Date of Delivery: 08/04/22  PRE-OPERATIVE DIAGNOSIS:  1) [redacted]w[redacted]d pregnancy.    POST-OPERATIVE DIAGNOSIS:  1) [redacted]w[redacted]d pregnancy s/p Vaginal, Vacuum (Extractor)  Chorioamnionitis  Delivery Type: Vaginal, Vacuum (Extractor)    Delivery Anesthesia: Epidural;Local   Labor Complications: Chorioamnionitis    ESTIMATED BLOOD LOSS: 820 ml    FINDINGS:   1) female infant, Apgar scores of 8   at 1 minute and 9   at 5 minutes; birthweight pending   SPECIMENS:   PLACENTA:   Appearance: Intact    Removal: Spontaneous      Disposition:  pathology  CORD BLOOD: Collected DISPOSITION:  Infant left in stable condition in the delivery room, with L&D personnel and mother,  NARRATIVE SUMMARY: Labor course:  Lori Stevenson is a G1P0 at [redacted]w[redacted]d who presented to Labor & Delivery for labor management. Her initial cervical exam was 4/80/-2. Labor proceeded spontaneously and was augmented with AROM and Pitocin. She was found to be completely dilated at [place]. Second stage was notable for recurrent decelerations and fetal tachycardia. Lori Stevenson developed a fever and received Tylenol and IV antibiotics. Dr. Amalia Hailey was consulted for a vacuum-assisted delivery and was at the bedside for the birth (see note). Thick meconium was noted after the head. A loose nuchal cord was easily reduced. The shoulders were birthed without difficulty. The infant was placed skin-to-skin with Lori Stevenson. The cord was doubly clamped and cut by the father when pulsations ceased. The placenta delivered spontaneously and was noted to be intact with a 3VC. A perineal and vaginal examination was performed. Episiotomy/Lacerations: Labial;2nd degree  Right labial laceration was repaired with 4-0 suture and 2nd degree vaginal was repaired with 3-0 Vicryl by Dr. Amalia Hailey.Lacerations were repaired using local and epidural anesthesia. Danasia tolerated this well. Mother and baby were  left in stable condition.   Lloyd Huger, CNM 07/26/2022 5:07 AM            VACUUM DELIVERY NOTE PREOPERATIVE DIAGNOSES: 1. Term pregnancy [redacted]w[redacted]d pregnancy.  2. Maternal Exhaustion  3. Fetal tachycardia 4. Maternal fever 5. Non-reassuring FHR tracing  POSTOPERATIVE DIAGNOSES: Same with: 1)  [redacted]w[redacted]d pregnancy s/p Vaginal, Vacuum (Extractor)  2)  female infant, Apgar scores of 8   at 1 minute and 9   at 5 minutes and a birthweight of 99.47  ounces.     Delivery Anesthesia: Epidural;Local   ESTIMATED BLOOD LOSS: 830  ml    FINDINGS:    DISPOSITION:  Infant to left in stable condition in the delivery room, with L&D personnel and mother,  Risks/Benefits The options for labor management at his time were discussed with the patient and her partner, as were the delivery options including a Kiwi vacuum delivery. The risks and proposed benefits of vacuum delivery were discussed in detail.  The possibility of cesarean delivery was also discussed. The patient decided to proceed with a Kiwi vacuum delivery.    DESCRIPTION OF THE PROCEDURE:    The baby's head was noted to be visible at the introitus at +2 station.  The bladder was confirmed to be empty. The vacuum was placed and the correct placement was confirmed digitally. With the patient's contractions, vacuum traction was applied to assist with maternal pushes.  There were 0 pop-offs.  The duration of vacuum use was less than 3 minutes.  The vacuum was used to assist in delivery of the fetal vertex and then deflated and removed.  The remainder  of the delivery of the infant and placenta was effected in the usual manner.   Episiotomy/Lacerations: Labial;2nd degree  Episiotomy or lacerations were repaired with Vicryl suture using local anesthesia.  See above for delivery complete summary

## 2021-12-06 ENCOUNTER — Other Ambulatory Visit: Payer: Self-pay

## 2021-12-06 ENCOUNTER — Emergency Department: Payer: Medicaid Other

## 2021-12-06 ENCOUNTER — Encounter: Payer: Self-pay | Admitting: Emergency Medicine

## 2021-12-06 ENCOUNTER — Emergency Department
Admission: EM | Admit: 2021-12-06 | Discharge: 2021-12-06 | Disposition: A | Discharge status: Home / Self Care | Payer: Medicaid Other | Attending: Emergency Medicine | Admitting: Emergency Medicine

## 2021-12-06 DIAGNOSIS — Z3A01 Less than 8 weeks gestation of pregnancy: Secondary | ICD-10-CM | POA: Diagnosis not present

## 2021-12-06 DIAGNOSIS — R1030 Lower abdominal pain, unspecified: Secondary | ICD-10-CM

## 2021-12-06 DIAGNOSIS — R42 Dizziness and giddiness: Secondary | ICD-10-CM | POA: Diagnosis not present

## 2021-12-06 DIAGNOSIS — O26851 Spotting complicating pregnancy, first trimester: Secondary | ICD-10-CM | POA: Insufficient documentation

## 2021-12-06 DIAGNOSIS — N9489 Other specified conditions associated with female genital organs and menstrual cycle: Secondary | ICD-10-CM | POA: Insufficient documentation

## 2021-12-06 DIAGNOSIS — R8289 Other abnormal findings on cytological and histological examination of urine: Secondary | ICD-10-CM | POA: Diagnosis not present

## 2021-12-06 DIAGNOSIS — O26891 Other specified pregnancy related conditions, first trimester: Secondary | ICD-10-CM | POA: Insufficient documentation

## 2021-12-06 DIAGNOSIS — R1032 Left lower quadrant pain: Secondary | ICD-10-CM | POA: Insufficient documentation

## 2021-12-06 DIAGNOSIS — J45909 Unspecified asthma, uncomplicated: Secondary | ICD-10-CM | POA: Diagnosis not present

## 2021-12-06 LAB — CBC
HCT: 42.5 % (ref 36.0–46.0)
Hemoglobin: 14.1 g/dL (ref 12.0–15.0)
MCH: 29 pg (ref 26.0–34.0)
MCHC: 33.2 g/dL (ref 30.0–36.0)
MCV: 87.3 fL (ref 80.0–100.0)
Platelets: 291 10*3/uL (ref 150–400)
RBC: 4.87 MIL/uL (ref 3.87–5.11)
RDW: 12.4 % (ref 11.5–15.5)
WBC: 5.2 10*3/uL (ref 4.0–10.5)
nRBC: 0 % (ref 0.0–0.2)

## 2021-12-06 LAB — URINALYSIS, ROUTINE W REFLEX MICROSCOPIC
Bilirubin Urine: NEGATIVE
Glucose, UA: NEGATIVE mg/dL
Ketones, ur: 40 mg/dL — AB
Leukocytes,Ua: NEGATIVE
Nitrite: NEGATIVE
Protein, ur: NEGATIVE mg/dL
Specific Gravity, Urine: 1.025 (ref 1.005–1.030)
pH: 6.5 (ref 5.0–8.0)

## 2021-12-06 LAB — HCG, QUANTITATIVE, PREGNANCY: hCG, Beta Chain, Quant, S: 20867 m[IU]/mL — ABNORMAL HIGH (ref <5)

## 2021-12-06 LAB — COMPREHENSIVE METABOLIC PANEL
ALT: 24 U/L (ref 0–44)
AST: 27 U/L (ref 15–41)
Albumin: 4.2 g/dL (ref 3.5–5.0)
Alkaline Phosphatase: 46 U/L (ref 38–126)
Anion gap: 6 (ref 5–15)
BUN: 10 mg/dL (ref 6–20)
CO2: 24 mmol/L (ref 22–32)
Calcium: 9.3 mg/dL (ref 8.9–10.3)
Chloride: 105 mmol/L (ref 98–111)
Creatinine, Ser: 0.63 mg/dL (ref 0.44–1.00)
GFR, Estimated: >60 mL/min (ref >60)
Glucose, Bld: 87 mg/dL (ref 70–99)
Potassium: 3.7 mmol/L (ref 3.5–5.1)
Sodium: 135 mmol/L (ref 135–145)
Total Bilirubin: 0.5 mg/dL (ref 0.3–1.2)
Total Protein: 7.9 g/dL (ref 6.5–8.1)

## 2021-12-06 LAB — POC URINE PREG, ED: Preg Test, Ur: POSITIVE — AB

## 2021-12-06 LAB — URINALYSIS, MICROSCOPIC (REFLEX)

## 2021-12-06 MED ORDER — METOCLOPRAMIDE HCL 10 MG PO TABS: 10.0000 mg | ORAL_TABLET | Freq: Three times a day (TID) | ORAL | 0 refills | Status: DC | PRN

## 2021-12-06 NOTE — ED Notes (Signed)
See triage note. Pt reports nausea is intermittent. Reports LLQ pain is like "cramping". Denies any other symptoms. Visitor remains with pt.

## 2021-12-06 NOTE — ED Notes (Signed)
Pt sitting calmly on stretcher watching tv; in NAD; visitor remains at bedside; called lab to add on hcg preg and staff stated they will do so now.

## 2021-12-06 NOTE — ED Provider Notes (Signed)
[facility] Provider Note    Event Date/Time   First MD Initiated Contact with Patient 12/06/21 1714     (approximate)   History   Chief Complaint Abdominal Pain   HPI Lori Stevenson is a 26 y.o. female, history of asthma, presents to the emergency department for evaluation of abdominal pain.  Patient states that she just learned that she was pregnant yesterday, but she has been having 2 days of abdominal pain, predominantly in the left lower side.  Additionally endorses some vaginal spotting which she only notices when she urinates. Additionally reports some dizziness.  She presented to [facility] clinic urgent care earlier today, who referred her here for ectopic pregnancy rule out.  Denies fever/chills, urinary symptoms, back pain, flank pain, chest pain, shortness of breath, or dysuria  History Limitations: No limitations.      Physical Exam  Triage Vital Signs: ED Triage Vitals  Enc Vitals Group     BP 12/06/21 1730 112/65     Pulse Rate 12/06/21 1730 66     Resp 12/06/21 1730 16     Temp --      Temp src --      SpO2 12/06/21 1730 99 %     Weight 12/06/21 1545 123 lb 0.3 oz (55.8 kg)     Height 12/06/21 1545 4\' 10"  (1.473 m)     Head Circumference --      Peak Flow --      Pain Score 12/06/21 1545 4     Pain Loc --      Pain Edu? --      Excl. in GC? --     Most recent vital signs: Vitals:   12/06/21 1730  BP: 112/65  Pulse: 66  Resp: 16  SpO2: 99%    General: Awake, NAD.  CV: Good peripheral perfusion.  Resp: Normal effort.  Abd: Soft, non-tender. No distention.  Neuro: At baseline. No gross neurological deficits. Other:   Physical Exam    ED Results / Procedures / Treatments  Labs (all labs ordered are listed, but only abnormal results are displayed) Labs Reviewed  URINALYSIS, ROUTINE W REFLEX MICROSCOPIC - Abnormal; Notable for the following components:      Result Value   Hgb urine dipstick LARGE (*)    Ketones,  ur 40 (*)    All other components within normal limits  URINALYSIS, MICROSCOPIC (REFLEX) - Abnormal; Notable for the following components:   Bacteria, UA FEW (*)    All other components within normal limits  POC URINE PREG, ED - Abnormal; Notable for the following components:   Preg Test, Ur POSITIVE (*)    All other components within normal limits  COMPREHENSIVE METABOLIC PANEL  CBC  HCG, QUANTITATIVE, PREGNANCY     EKG Not applicable.   RADIOLOGY  ED Provider Interpretation: I personally reviewed and interpreted this ultrasound.  Probable early intrauterine gestational sac  02/03/22 OB LESS THAN 14 WEEKS WITH OB TRANSVAGINAL  Result Date: 12/06/2021 CLINICAL DATA:  Left lower quadrant pain EXAM: OBSTETRIC <14 WK 02/03/2022 AND TRANSVAGINAL OB US TECHNIQUE: Both transabdominal and transvaginal ultrasound examinations were performed for complete evaluation of the gestation as well as the maternal uterus, adnexal regions, and pelvic cul-de-sac. Transvaginal technique was performed to assess early pregnancy. COMPARISON:  Ultrasound 05/21/2015 FINDINGS: Intrauterine gestational sac: Single Yolk sac:  Not Visualized. Embryo:  Not Visualized. Cardiac Activity: Not Visualized. Heart Rate: Not applicable MSD: 9.6 mm   5 w  5 d CRL:  Not applicable Subchorionic hemorrhage:  None visualized. Maternal uterus/adnexae: Normal right ovary. Corpus luteal cyst in the left ovary measuring 2.9 cm. Normal color Doppler flow. IMPRESSION: Probable early intrauterine gestational sac, but no yolk sac, fetal pole, or cardiac activity yet visualized. Recommend follow-up quantitative B-HCG levels and follow-up US in 14 days to assess viability. This recommendation follows SRU consensus guidelines: Diagnostic Criteria for Nonviable Pregnancy Early in the First Trimester. Malva Limes Med 2013; 828:0034-91. Electronically Signed   By: Caprice Renshaw M.D.   On: 12/06/2021 17:37    PROCEDURES:  Critical Care performed:  None.  Procedures    MEDICATIONS ORDERED IN ED: Medications - No data to display   IMPRESSION / MDM / ASSESSMENT AND PLAN / ED COURSE  I reviewed the triage vital signs and the nursing notes.                              Lori Stevenson is a 26 y.o. female, history of asthma, presents to the emergency department for evaluation of abdominal pain.  Patient states that she just learned that she was pregnant yesterday, but she has been having 2 days of abdominal pain, predominantly in the left lower side.  Additionally endorses some vaginal spotting which she only notices when she urinates. Additionally reports some dizziness.  Differential diagnosis includes, but is not limited to, ectopic pregnancy, morning sickness, ovarian cyst, ovarian torsion, subchorionic hemorrhage.  ED Course Patient appears well.  Vital signs within normal limits.  CBC shows no leukocytosis or anemia.  CMP unremarkable for electrolyte abnormalities, transaminitis, or kidney injury.    Urinalysis shows few bacteria, though notable for squamous epithelial cells, likely contamination as opposed to active UTI.  Hemoglobin and ketones appreciated in urine.   Assessment/Plan OB ultrasound shows probable early intrauterine gestational sac.  No evidence of ectopic pregnancy or subchorionic hemorrhage.  Advise repeat ultrasound in 14 days.  Patient appears stable.  I do not suspect any other serious or life-threatening pathology.  We will plan to discharge this patient with a referral to OB/GYN and a prescription for metoclopramide.  Patient was provided with anticipatory guidance, return precautions, and educational material. Encouraged the patient to return to the emergency department at any time if they begin to experience any new or worsening symptoms.       FINAL CLINICAL IMPRESSION(S) / ED DIAGNOSES   Final diagnoses:  Lower abdominal pain     Rx / DC Orders   ED Discharge Orders          Ordered     metoCLOPramide (REGLAN) 10 MG tablet  Every 8 hours PRN        12/06/21 1801             Note:  This document was prepared using Dragon voice recognition software and may include unintentional dictation errors.   Varney Daily, [place] 12/06/21 Merrily Brittle    Sharman Cheek, MD 12/07/21 [phone]

## 2021-12-06 NOTE — ED Triage Notes (Signed)
Pt brought from [facility] urgent care, pt had a pos pregnancy test and is having abd pain with dizziness, pt is here to rule out ectopic preg

## 2021-12-06 NOTE — ED Triage Notes (Signed)
Pt comes into the ED via [facility] to r/o ectopic pregnancy.  Pt is pregnant but unknown how far along.  Pt states the abd pain is on the left lower side and it started for 2 days ago.  Pt denies any diarrhea but has N/V.  Pt in NAD at this time.

## 2021-12-06 NOTE — Discharge Instructions (Addendum)
-  Follow-up with the OB/GYN provider listed above for repeat ultrasound. -Return to the emergency department at any time if you begin to experience any new or worsening symptoms.

## 2022-01-07 ENCOUNTER — Ambulatory Visit (INDEPENDENT_AMBULATORY_CARE_PROVIDER_SITE_OTHER): Payer: Self-pay | Admitting: Obstetrics and Gynecology

## 2022-01-07 ENCOUNTER — Encounter: Payer: Self-pay | Admitting: Obstetrics and Gynecology

## 2022-01-07 ENCOUNTER — Other Ambulatory Visit: Payer: Self-pay

## 2022-01-07 VITALS — BP 113/77 | HR 69 | Ht <= 58 in | Wt 134.1 lb

## 2022-01-07 DIAGNOSIS — Z3200 Encounter for pregnancy test, result unknown: Secondary | ICD-10-CM

## 2022-01-07 NOTE — Progress Notes (Signed)
HPI: ?     [name] is a 26 y.o. G1P0 who LMP was Patient's last menstrual period was 10/28/2021 (exact date). ? ?Subjective:  ? ?She presents today approximately 1 month after being seen in the emergency department where she underwent an ultrasound showing a gestational sac in the uterus but no crown-rump length.  Since that time she has continued to experience nausea and vomiting.  She is currently taking prenatal vitamins.  She denies vaginal bleeding. ?She complains of occasional midline epigastric pain after eating.  Sometimes it is severe. (She reports no history of gallstones or previous gallbladder disease.) ? ?  Hx: ?The following portions of the patient's history were reviewed and updated as appropriate: ?            She  has a past medical history of Asthma. ?She does not have a problem list on file. ?She  has no past surgical history on file. ?Her family history includes Thyroid disease in her mother. ?She  reports that she has never smoked. She has never used smokeless tobacco. She reports that she does not drink alcohol and does not use drugs. ?She has a current medication list which includes the following prescription(s): albuterol, albuterol, prenatal vit-fe fumarate-fa, fluticasone, levonorgestrel-ethinyl estradiol, metoclopramide, [DISCONTINUED] omeprazole, [DISCONTINUED] promethazine, and [DISCONTINUED] sucralfate. ?She has No Known Allergies. ?      ?Review of Systems:  ?Review of Systems ? ?Constitutional: Denied constitutional symptoms, night sweats, recent illness, fatigue, fever, insomnia and weight loss.  ?Eyes: Denied eye symptoms, eye pain, photophobia, vision change and visual disturbance.  ?Ears/Nose/Throat/Neck: Denied ear, nose, throat or neck symptoms, hearing loss, nasal discharge, sinus congestion and sore throat.  ?Cardiovascular: Denied cardiovascular symptoms, arrhythmia, chest pain/pressure, edema, exercise intolerance, orthopnea and palpitations.  ?Respiratory:  Denied pulmonary symptoms, asthma, pleuritic pain, productive sputum, cough, dyspnea and wheezing.  ?Gastrointestinal: See HPI for additional information.  ?Genitourinary: Denied genitourinary symptoms including symptomatic vaginal discharge, pelvic relaxation issues, and urinary complaints.  ?Musculoskeletal: Denied musculoskeletal symptoms, stiffness, swelling, muscle weakness and myalgia.  ?Dermatologic: Denied dermatology symptoms, rash and scar.  ?Neurologic: Denied neurology symptoms, dizziness, headache, neck pain and syncope.  ?Psychiatric: Denied psychiatric symptoms, anxiety and depression.  ?Endocrine: Denied endocrine symptoms including hot flashes and night sweats.  ? ?Meds: ?  ?Current Outpatient Medications on File Prior to Visit  ?Medication Sig Dispense Refill  ? albuterol (PROVENTIL) (2.5 MG/3ML) 0.083% nebulizer solution Take 3 mLs (2.5 mg total) by nebulization every 6 (six) hours as needed for wheezing or shortness of breath. 75 mL 12  ? albuterol (VENTOLIN HFA) 108 (90 Base) MCG/ACT inhaler Inhale 2 puffs into the lungs every 6 (six) hours as needed for wheezing or shortness of breath. 1 each 2  ? Prenatal Vit-Fe Fumarate-FA (PRENATAL PO) Take by mouth.    ? fluticasone (FLONASE) 50 MCG/ACT nasal spray Place 2 sprays into both nostrils daily. (Patient not taking: Reported on 01/07/2022) 15.8 mL 0  ? Levonorgestrel-Ethinyl Estradiol (AMETHIA) 0.15-0.03 &0.01 MG tablet Take 1 tablet by mouth at bedtime. (Patient not taking: Reported on 01/07/2022) 84 tablet 1  ? metoCLOPramide (REGLAN) 10 MG tablet Take 1 tablet (10 mg total) by mouth every 8 (eight) hours as needed for up to 10 days for nausea. 30 tablet 0  ? [DISCONTINUED] omeprazole (PRILOSEC OTC) 20 MG tablet Take 1 tablet (20 mg total) by mouth daily. 28 tablet 1  ? [DISCONTINUED] promethazine (PHENERGAN) 25 MG tablet Take 25 mg by mouth every 6 (six) hours  as needed for nausea or vomiting.    ? [DISCONTINUED] sucralfate (CARAFATE) 1 g tablet  Take 1 tablet (1 g total) by mouth 4 (four) times daily as needed (for abdominal discomfort, nausea, and/or vomiting). 30 tablet 1  ? ?No current facility-administered medications on file prior to visit.  ? ? ? ? ?Objective:  ?  ? ?Vitals:  ? 01/07/22 0956  ?BP: 113/77  ?Pulse: 69  ? ?Filed Weights  ? 01/07/22 0956  ?Weight: 134 lb 1.6 oz (60.8 kg)  ? ?  ?         Ultrasound results reviewed ?        ? ?Assessment:  ?  ?G1P0 ?There are no problems to display for this patient. ? ?  ?1. Possible pregnancy, not yet confirmed   ? ? Ultrasound showed only gestational sac without CRL ? ? ?Plan:  ?  ?       ? Prenatal Plan ?1.  The patient was given prenatal literature. ?2.  She was continued on prenatal vitamins. ?3.  A prenatal lab panel to be drawn at nurse visit. ?4.  An ultrasound was ordered to better determine an EDC. ?5.  A nurse visit was scheduled. ?6.  Genetic testing and testing for other inheritable conditions discussed in detail. She will decide in the future whether to have these labs performed. ?7.  A general overview of pregnancy testing, visit schedule, ultrasound schedule, and prenatal care was discussed. ?8.  COVID and its risks associated with pregnancy, prevention by limiting exposure and use of masks, as well as the risks and benefits of vaccination during pregnancy were discussed in detail.  [facility] policy regarding office and hospital visitation and testing was explained. ?9.  Benefits of breast-feeding discussed in detail including both maternal and infant benefits. Ready Set Baby website discussed. ?     10.  Literature given on nausea and vomiting in pregnancy ?     11.  Possible future consideration of gallbladder ultrasound based on patient's epigastric symptoms. ?Orders ?Orders Placed This Encounter  ?Procedures  ? US OB Comp Less 14 Wks  ? ? No orders of the defined types were placed in this encounter. ?  ?  F/U ? Return in about 3 weeks (around 01/28/2022). ?I spent 24 minutes involved in the  care of this patient preparing to see the patient by obtaining and reviewing her medical history (including labs, imaging tests and prior procedures), documenting clinical information in the electronic health record (EHR), counseling and coordinating care plans, writing and sending prescriptions, ordering tests or procedures and in direct communicating with the patient and medical staff discussing pertinent items from her history and physical exam. ? ?Elonda Husky, M.D. ?01/07/2022 ?10:51 AM ? ? ? ? ?

## 2022-01-07 NOTE — Progress Notes (Signed)
Patient presents today for current pregnancy. Patients pregnancy was confirmed in the ED. She states occasional morning sickness and brown spotting. Patient states she was not trying to conceive. Patient has no questions or concerns at this time. ?

## 2022-01-15 ENCOUNTER — Other Ambulatory Visit: Payer: Self-pay

## 2022-01-15 ENCOUNTER — Ambulatory Visit (INDEPENDENT_AMBULATORY_CARE_PROVIDER_SITE_OTHER): Payer: Medicaid Other

## 2022-01-15 DIAGNOSIS — Z3200 Encounter for pregnancy test, result unknown: Secondary | ICD-10-CM | POA: Diagnosis not present

## 2022-01-25 ENCOUNTER — Other Ambulatory Visit: Payer: Self-pay

## 2022-01-25 ENCOUNTER — Ambulatory Visit (INDEPENDENT_AMBULATORY_CARE_PROVIDER_SITE_OTHER): Payer: Medicaid Other | Admitting: Obstetrics and Gynecology

## 2022-01-25 VITALS — BP 112/63 | HR 75 | Resp 16 | Ht <= 58 in | Wt 130.5 lb

## 2022-01-25 DIAGNOSIS — Z113 Encounter for screening for infections with a predominantly sexual mode of transmission: Secondary | ICD-10-CM | POA: Diagnosis not present

## 2022-01-25 DIAGNOSIS — Z3A12 12 weeks gestation of pregnancy: Secondary | ICD-10-CM | POA: Diagnosis not present

## 2022-01-25 DIAGNOSIS — Z3401 Encounter for supervision of normal first pregnancy, first trimester: Secondary | ICD-10-CM

## 2022-01-25 DIAGNOSIS — Z0283 Encounter for blood-alcohol and blood-drug test: Secondary | ICD-10-CM

## 2022-01-25 LAB — OB RESULTS CONSOLE VARICELLA ZOSTER ANTIBODY, IGG: Varicella: IMMUNE

## 2022-01-25 NOTE — Progress Notes (Signed)
Lori Stevenson presents for NOB nurse interview visit. Pregnancy confirmation done 01/07/2022. G1P0000. Pregnancy education material explained and given. No cats in the home. NOB labs ordered. HIV labs and Drug screen were explained optional and she did not decline. Drug screen ordered/declined. PNV encouraged. Genetic screening options discussed. Genetic testing:Declined.  Pt may discuss with provider.  Financial policy reviewed. FMLA form reviewed and signed. Patient to follow up with [facility] in 3 weeks for NOB physical.  All questions answered. ? ? ?

## 2022-01-25 NOTE — Patient Instructions (Signed)
Breast Self-Awareness ?Breast self-awareness means being familiar with how your breasts look and feel. It involves checking your breasts regularly and reporting any changes to your health care provider. ?Practicing breast self-awareness is important. Sometimes changes may not be harmful (are benign), but sometimes a change in your breasts can be a sign of a serious medical problem. It is important to learn how to do this procedure correctly so that you can catch problems early, when treatment is more likely to be successful. All women should practice breast self-awareness, including women who have had breast implants. ?What you need: ?A mirror. ?A well-lit room. ?How to do a breast self-exam ?A breast self-exam is one way to learn what is normal for your breasts and whether your breasts are changing. To do a breast self-exam: ?Look for changes ? ?Remove all the clothing above your waist. ?Stand in front of a mirror in a room with good lighting. ?Put your hands on your hips. ?Push your hands firmly downward. ?Compare your breasts in the mirror. Look for differences between them (asymmetry), such as: ?Differences in shape. ?Differences in size. ?Puckers, dips, and bumps in one breast and not the other. ?Look at each breast for changes in the skin, such as: ?Redness. ?Scaly areas. ?Look for changes in your nipples, such as: ?Discharge. ?Bleeding. ?Dimpling. ?Redness. ?A change in position. ?Feel for changes ?Carefully feel your breasts for lumps and changes. It is best to do this while lying on your back on the floor, and again while sitting or standing in the tub or shower with soapy water on your skin. Feel each breast in the following way: ?Place the arm on the side of the breast you are examining above your head. ?Feel your breast with the other hand. ?Start in the nipple area and make ?-inch (2 cm) overlapping circles to feel your breast. Use the pads of your three middle fingers to do this. Apply light pressure,  then medium pressure, then firm pressure. The light pressure will allow you to feel the tissue closest to the skin. The medium pressure will allow you to feel the tissue that is a little deeper. The firm pressure will allow you to feel the tissue close to the ribs. ?Continue the overlapping circles, moving downward over the breast until you feel your ribs below your breast. ?Move one finger-width toward the center of the body. Continue to use the ?-inch (2 cm) overlapping circles to feel your breast as you move slowly up toward your collarbone. ?Continue the up-and-down exam using all three pressures until you reach your armpit. ? ?Write down what you find ?Writing down what you find can help you remember what to discuss with your health care provider. Write down: ?What is normal for each breast. ?Any changes that you find in each breast, including: ?The kind of changes you find. ?Any pain or tenderness. ?Size and location of any lumps. ?Where you are in your menstrual cycle, if you are still menstruating. ?General tips and recommendations ?Examine your breasts every month. ?If you are breastfeeding, the best time to examine your breasts is after a feeding or after using a breast pump. ?If you menstruate, the best time to examine your breasts is 5-7 days after your period. Breasts are generally lumpier during menstrual periods, and it may be more difficult to notice changes. ?With time and practice, you will become more familiar with the variations in your breasts and more comfortable with the exam. ?Contact a health care provider if you: ?  See a change in the shape or size of your breasts or nipples. ?See a change in the skin of your breast or nipples, such as a reddened or scaly area. ?Have unusual discharge from your nipples. ?Find a lump or thick area that was not there before. ?Have pain in your breasts. ?Have any concerns related to your breast health. ?Summary ?Breast self-awareness includes looking for  physical changes in your breasts, as well as feeling for any changes within your breasts. ?Breast self-awareness should be performed in front of a mirror in a well-lit room. ?You should examine your breasts every month. If you menstruate, the best time to examine your breasts is 5-7 days after your menstrual period. ?Let your health care provider know of any changes you notice in your breasts, including changes in size, changes on the skin, pain or tenderness, or unusual fluid from your nipples. ?This information is not intended to replace advice given to you by your health care provider. Make sure you discuss any questions you have with your health care provider. ?Document Revised: 06/09/2018 Document Reviewed: 06/09/2018 ?Elsevier Patient Education ? [place]. ?Preventive Care 23-58 Years Old, Female ?Preventive care refers to lifestyle choices and visits with your health care provider that can promote health and wellness. Preventive care visits are also called wellness exams. ?What can I expect for my preventive care visit? ?Counseling ?During your preventive care visit, your health care provider may ask about your: ?Medical history, including: ?Past medical problems. ?Family medical history. ?Pregnancy history. ?Current health, including: ?Menstrual cycle. ?Method of birth control. ?Emotional well-being. ?Home life and relationship well-being. ?Sexual activity and sexual health. ?Lifestyle, including: ?Alcohol, nicotine or tobacco, and drug use. ?Access to firearms. ?Diet, exercise, and sleep habits. ?Work and work Statistician. ?Sunscreen use. ?Safety issues such as seatbelt and bike helmet use. ?Physical exam ?Your health care provider may check your: ?Height and weight. These may be used to calculate your BMI (body mass index). BMI is a measurement that tells if you are at a healthy weight. ?Waist circumference. This measures the distance around your waistline. This measurement also tells if you are  at a healthy weight and may help predict your risk of certain diseases, such as type 2 diabetes and high blood pressure. ?Heart rate and blood pressure. ?Body temperature. ?Skin for abnormal spots. ?What immunizations do I need? ?Vaccines are usually given at various ages, according to a schedule. Your health care provider will recommend vaccines for you based on your age, medical history, and lifestyle or other factors, such as travel or where you work. ?What tests do I need? ?Screening ?Your health care provider may recommend screening tests for certain conditions. This may include: ?Pelvic exam and Pap test. ?Lipid and cholesterol levels. ?Diabetes screening. This is done by checking your blood sugar (glucose) after you have not eaten for a while (fasting). ?Hepatitis B test. ?Hepatitis C test. ?HIV (human immunodeficiency virus) test. ?STI (sexually transmitted infection) testing, if you are at risk. ?BRCA-related cancer screening. This may be done if you have a family history of breast, ovarian, tubal, or peritoneal cancers. ?Talk with your health care provider about your test results, treatment options, and if necessary, the need for more tests. ?Follow these instructions at home: ?Eating and drinking ? ?Eat a healthy diet that includes fresh fruits and vegetables, whole grains, lean protein, and low-fat dairy products. ?Take vitamin and mineral supplements as recommended by your health care provider. ?Do not drink alcohol if: ?Your health care  provider tells you not to drink. ?You are pregnant, may be pregnant, or are planning to become pregnant. ?If you drink alcohol: ?Limit how much you have to 0-1 drink a day. ?Know how much alcohol is in your drink. In the [place] one drink equals one 12 oz bottle of beer (355 mL), one 5 oz glass of wine (148 mL), or one 1? oz glass of hard liquor (44 mL). ?Lifestyle ?Brush your teeth every morning and night with fluoride toothpaste. Floss one time each day. ?Exercise for  at least 30 minutes 5 or more days each week. ?Do not use any products that contain nicotine or tobacco. These products include cigarettes, chewing tobacco, and vaping devices, such as e-cigarettes. If you ne

## 2022-01-26 LAB — MICROSCOPIC EXAMINATION: Casts: NONE SEEN /lpf

## 2022-01-26 LAB — URINALYSIS, ROUTINE W REFLEX MICROSCOPIC
Bilirubin, UA: NEGATIVE
Glucose, UA: NEGATIVE
Ketones, UA: NEGATIVE
Nitrite, UA: NEGATIVE
RBC, UA: NEGATIVE
Specific Gravity, UA: 1.021 (ref 1.005–1.030)
Urobilinogen, Ur: 1 mg/dL (ref 0.2–1.0)
pH, UA: 8 — ABNORMAL HIGH (ref 5.0–7.5)

## 2022-01-27 LAB — CULTURE, OB URINE

## 2022-01-27 LAB — GC/CHLAMYDIA PROBE AMP
Chlamydia trachomatis, NAA: NEGATIVE
Neisseria Gonorrhoeae by PCR: NEGATIVE

## 2022-01-27 LAB — URINE CULTURE, OB REFLEX

## 2022-01-28 ENCOUNTER — Encounter: Payer: Self-pay | Admitting: Obstetrics and Gynecology

## 2022-01-28 LAB — PAIN MGT SCRN (14 DRUGS), UR
Amphetamine Scrn, Ur: NEGATIVE ng/mL
BARBITURATE SCREEN URINE: NEGATIVE ng/mL
BENZODIAZEPINE SCREEN, URINE: NEGATIVE ng/mL
Buprenorphine, Urine: NEGATIVE ng/mL
CANNABINOIDS UR QL SCN: POSITIVE ng/mL — AB
Cocaine (Metab) Scrn, Ur: NEGATIVE ng/mL
Creatinine(Crt), U: 138.6 mg/dL (ref 20.0–300.0)
Fentanyl, Urine: NEGATIVE pg/mL
Meperidine Screen, Urine: NEGATIVE ng/mL
Methadone Screen, Urine: NEGATIVE ng/mL
OXYCODONE+OXYMORPHONE UR QL SCN: NEGATIVE ng/mL
Opiate Scrn, Ur: NEGATIVE ng/mL
Ph of Urine: 8.6 (ref 4.5–8.9)
Phencyclidine Qn, Ur: NEGATIVE ng/mL
Propoxyphene Scrn, Ur: NEGATIVE ng/mL
Tramadol Screen, Urine: NEGATIVE ng/mL

## 2022-01-28 LAB — NICOTINE SCREEN, URINE: Cotinine Ql Scrn, Ur: NEGATIVE ng/mL

## 2022-01-29 ENCOUNTER — Encounter: Payer: Self-pay | Admitting: Emergency Medicine

## 2022-01-29 ENCOUNTER — Emergency Department
Admission: EM | Admit: 2022-01-29 | Discharge: 2022-01-29 | Disposition: A | Discharge status: Home / Self Care | Payer: Medicaid Other | Attending: Emergency Medicine | Admitting: Emergency Medicine

## 2022-01-29 ENCOUNTER — Other Ambulatory Visit: Payer: Self-pay

## 2022-01-29 DIAGNOSIS — O26891 Other specified pregnancy related conditions, first trimester: Secondary | ICD-10-CM | POA: Insufficient documentation

## 2022-01-29 DIAGNOSIS — R1030 Lower abdominal pain, unspecified: Secondary | ICD-10-CM | POA: Diagnosis not present

## 2022-01-29 DIAGNOSIS — Z3A12 12 weeks gestation of pregnancy: Secondary | ICD-10-CM | POA: Insufficient documentation

## 2022-01-29 LAB — CBC WITH DIFFERENTIAL/PLATELET
Abs Immature Granulocytes: 0.02 10*3/uL (ref 0.00–0.07)
Basophils Absolute: 0 10*3/uL (ref 0.0–0.1)
Basophils Relative: 0 %
Eosinophils Absolute: 0.1 10*3/uL (ref 0.0–0.5)
Eosinophils Relative: 1 %
HCT: 37.1 % (ref 36.0–46.0)
Hemoglobin: 12.8 g/dL (ref 12.0–15.0)
Immature Granulocytes: 0 %
Lymphocytes Relative: 29 %
Lymphs Abs: 1.9 10*3/uL (ref 0.7–4.0)
MCH: 28.8 pg (ref 26.0–34.0)
MCHC: 34.5 g/dL (ref 30.0–36.0)
MCV: 83.4 fL (ref 80.0–100.0)
Monocytes Absolute: 0.3 10*3/uL (ref 0.1–1.0)
Monocytes Relative: 5 %
Neutro Abs: 4.2 10*3/uL (ref 1.7–7.7)
Neutrophils Relative %: 65 %
Platelets: 241 10*3/uL (ref 150–400)
RBC: 4.45 MIL/uL (ref 3.87–5.11)
RDW: 11.8 % (ref 11.5–15.5)
WBC: 6.5 10*3/uL (ref 4.0–10.5)
nRBC: 0 % (ref 0.0–0.2)

## 2022-01-29 LAB — VIRAL HEPATITIS HBV, HCV
HCV Ab: NONREACTIVE
Hep B Core Total Ab: NEGATIVE
Hep B Surface Ab, Qual: NONREACTIVE
Hepatitis B Surface Ag: NEGATIVE

## 2022-01-29 LAB — BASIC METABOLIC PANEL
Anion gap: 10 (ref 5–15)
BUN: 8 mg/dL (ref 6–20)
CO2: 22 mmol/L (ref 22–32)
Calcium: 9.7 mg/dL (ref 8.9–10.3)
Chloride: 104 mmol/L (ref 98–111)
Creatinine, Ser: 0.74 mg/dL (ref 0.44–1.00)
GFR, Estimated: >60 mL/min (ref >60)
Glucose, Bld: 117 mg/dL — ABNORMAL HIGH (ref 70–99)
Potassium: 3.5 mmol/L (ref 3.5–5.1)
Sodium: 136 mmol/L (ref 135–145)

## 2022-01-29 LAB — URINALYSIS, COMPLETE (UACMP) WITH MICROSCOPIC
Bilirubin Urine: NEGATIVE
Glucose, UA: NEGATIVE mg/dL
Hgb urine dipstick: NEGATIVE
Ketones, ur: NEGATIVE mg/dL
Nitrite: NEGATIVE
Protein, ur: NEGATIVE mg/dL
Specific Gravity, Urine: 1.017 (ref 1.005–1.030)
pH: 6 (ref 5.0–8.0)

## 2022-01-29 LAB — PARVOVIRUS B19 ANTIBODY, IGG AND IGM
Parvovirus B19 IgG: 0.5 index (ref 0.0–0.8)
Parvovirus B19 IgM: 0.1 index (ref 0.0–0.8)

## 2022-01-29 LAB — ANTIBODY SCREEN: Antibody Screen: NEGATIVE

## 2022-01-29 LAB — POC URINE PREG, ED
Preg Test, Ur: POSITIVE — AB
Preg Test, Ur: POSITIVE — AB

## 2022-01-29 LAB — RUBELLA SCREEN: Rubella Antibodies, IGG: 2.33 index (ref >0.99)

## 2022-01-29 LAB — ABO AND RH: Rh Factor: NEGATIVE

## 2022-01-29 LAB — RPR: RPR Ser Ql: NONREACTIVE

## 2022-01-29 LAB — HIV ANTIBODY (ROUTINE TESTING W REFLEX): HIV Screen 4th Generation wRfx: NONREACTIVE

## 2022-01-29 LAB — VARICELLA ZOSTER ANTIBODY, IGG: Varicella zoster IgG: 509 index (ref >165)

## 2022-01-29 LAB — HCV INTERPRETATION

## 2022-01-29 MED ORDER — CEPHALEXIN 500 MG PO CAPS: 500.0000 mg | ORAL_CAPSULE | Freq: Three times a day (TID) | ORAL | 0 refills | Status: AC

## 2022-01-29 NOTE — ED Triage Notes (Signed)
Pt via POV from home. Pt c/o lower abd cramping. Denies vaginal bleeding. States she has been under stress from a death in the family.  ?

## 2022-01-29 NOTE — ED Provider Notes (Signed)
? ?[facility] ?Provider Note ? ? ? Event Date/Time  ? First MD Initiated Contact with Patient 01/29/22 1331   ?  (approximate) ? ? ?History  ? ?Abdominal Pain ? ? ?HPI ? ?Lori Stevenson is a 26 y.o. female who reports she is approximately [redacted] weeks pregnant who presents with complaints of lower abdominal discomfort.  Patient reports her uncle died this weekend and she has been grieving.  She thinks this may be causing her abdominal discomfort.  She wants to make sure the baby is okay.  She denies dysuria, no vaginal discharge, no vaginal bleeding. ?  ? ? ?Physical Exam  ? ?Triage Vital Signs: ?ED Triage Vitals  ?Enc Vitals Group  ?   BP 01/29/22 1253 112/89  ?   Pulse Rate 01/29/22 1253 81  ?   Resp 01/29/22 1253 20  ?   Temp 01/29/22 1253 98.4 ?F (36.9 ?C)  ?   Temp Source 01/29/22 1253 Oral  ?   SpO2 01/29/22 1253 100 %  ?   Weight 01/29/22 1254 59 kg (130 lb)  ?   Height 01/29/22 1254 1.473 m (4\' 10" )  ?   Head Circumference --   ?   Peak Flow --   ?   Pain Score 01/29/22 1253 7  ?   Pain Loc --   ?   Pain Edu? --   ?   Excl. in [place]? --   ? ? ?Most recent vital signs: ?Vitals:  ? 01/29/22 1253  ?BP: 112/89  ?Pulse: 81  ?Resp: 20  ?Temp: 98.4 ?F (36.9 ?C)  ?SpO2: 100%  ? ? ? ?General: Awake, no distress.  ?CV:  Good peripheral perfusion.  ?Resp:  Normal effort.  ?Abd:  No distention.  Reassuring exam ?Other:   ? ? ?ED Results / Procedures / Treatments  ? ?Labs ?(all labs ordered are listed, but only abnormal results are displayed) ?Labs Reviewed  ?BASIC METABOLIC PANEL - Abnormal; Notable for the following components:  ?    Result Value  ? Glucose, Bld 117 (*)   ? All other components within normal limits  ?POC URINE PREG, ED - Abnormal; Notable for the following components:  ? Preg Test, Ur POSITIVE (*)   ? All other components within normal limits  ?POC URINE PREG, ED - Abnormal; Notable for the following components:  ? Preg Test, Ur POSITIVE (*)   ? All other components within normal  limits  ?CBC WITH DIFFERENTIAL/PLATELET  ?URINALYSIS, COMPLETE (UACMP) WITH MICROSCOPIC  ? ? ? ?EKG ? ? ? ? ?RADIOLOGY ?EMB U, heart rate 165, positive fetal motion ? ? ? ?PROCEDURES: ? ?Critical Care performed:  ? ?Procedures ? ? ?MEDICATIONS ORDERED IN ED: ?Medications - No data to display ? ? ?IMPRESSION / MDM / ASSESSMENT AND PLAN / ED COURSE  ?I reviewed the triage vital signs and the nursing notes. ? ? ? ?Patient well-appearing and in no acute distress.  Exam is overall reassuring.  No significant tenderness palpation of the abdomen to suggest appendicitis or other abnormality.  She does have some suprapubic discomfort.  No vaginal bleeding. ? ?Ultrasound performed which demonstrates IUP, normal heart rate.  Patient relieved to see this. ? ?Urinalysis pending to evaluate for UTI, she has OB/GYN follow-up ? ? ? ?  ? ? ?FINAL CLINICAL IMPRESSION(S) / ED DIAGNOSES  ? ?Final diagnoses:  ?Abdominal pain during pregnancy in first trimester  ? ? ? ?Rx / DC Orders  ? ?ED Discharge Orders   ? ?  None  ? ?  ? ? ? ?Note:  This document was prepared using Dragon voice recognition software and may include unintentional dictation errors. ?  ?Lavonia Drafts, MD ?01/29/22 1513 ? ?

## 2022-02-04 ENCOUNTER — Encounter: Payer: Self-pay | Admitting: Obstetrics and Gynecology

## 2022-02-14 ENCOUNTER — Other Ambulatory Visit (HOSPITAL_COMMUNITY)
Admission: RE | Admit: 2022-02-14 | Discharge: 2022-02-14 | Disposition: A | Discharge status: Home / Self Care | Payer: Medicaid Other | Source: Ambulatory Visit | Attending: Obstetrics and Gynecology | Admitting: Obstetrics and Gynecology

## 2022-02-14 ENCOUNTER — Encounter: Payer: Self-pay | Admitting: Obstetrics and Gynecology

## 2022-02-14 ENCOUNTER — Ambulatory Visit (INDEPENDENT_AMBULATORY_CARE_PROVIDER_SITE_OTHER): Payer: Medicaid Other | Admitting: Obstetrics and Gynecology

## 2022-02-14 VITALS — BP 97/65 | HR 79 | Wt 127.8 lb

## 2022-02-14 DIAGNOSIS — Z124 Encounter for screening for malignant neoplasm of cervix: Secondary | ICD-10-CM | POA: Diagnosis present

## 2022-02-14 DIAGNOSIS — Z3402 Encounter for supervision of normal first pregnancy, second trimester: Secondary | ICD-10-CM

## 2022-02-14 DIAGNOSIS — Z3A15 15 weeks gestation of pregnancy: Secondary | ICD-10-CM

## 2022-02-14 DIAGNOSIS — Z1379 Encounter for other screening for genetic and chromosomal anomalies: Secondary | ICD-10-CM

## 2022-02-14 LAB — POCT URINALYSIS DIPSTICK OB
Bilirubin, UA: NEGATIVE
Blood, UA: NEGATIVE
Glucose, UA: NEGATIVE
Ketones, UA: NEGATIVE
Leukocytes, UA: NEGATIVE
Nitrite, UA: NEGATIVE
POC,PROTEIN,UA: NEGATIVE
Spec Grav, UA: 1.01 (ref 1.010–1.025)
Urobilinogen, UA: 0.2 E.U./dL
pH, UA: 7.5 (ref 5.0–8.0)

## 2022-02-14 NOTE — Progress Notes (Signed)
Patient presents today for New OB physical. She states feeling well.Patient is due for her pap smear. Patient wants genetic testing, [id number] and afp ordered.  ?Patient states no other questions or concerns at this time.   ?

## 2022-02-14 NOTE — Progress Notes (Signed)
NOB: Patient much happier now in the second trimester.  MaterniT 21 and AFP today.  Anatomy ultrasound ordered for next visit.  Pap performed today. ? ?Physical examination ?General NAD, Conversant  ?HEENT Atraumatic; Op clear with mmm.  Normo-cephalic. Pupils reactive. Anicteric sclerae  ?Thyroid/Neck Smooth without nodularity or enlargement. Normal ROM.  Neck Supple.  ?Skin No rashes, lesions or ulceration. Normal palpated skin turgor. No nodularity.  ?Breasts: No masses or discharge.  Symmetric.  No axillary adenopathy.  ?Lungs: Clear to auscultation.No rales or wheezes. Normal Respiratory effort, no retractions.  ?Heart: NSR.  No murmurs or rubs appreciated. No periferal edema  ?Abdomen: Soft.  Non-tender.  No masses.  No HSM. No hernia  ?Extremities: Moves all appropriately.  Normal ROM for age. No lymphadenopathy.  ?Neuro: Oriented to PPT.  Normal mood. Normal affect.  ? ?  Pelvic:   ?Vulva: Normal appearance.  No lesions.  ?Vagina: No lesions or abnormalities noted.  ?Support: Normal pelvic support.  ?Urethra No masses tenderness or scarring.  ?Meatus Normal size without lesions or prolapse.  ?Cervix: Normal appearance.  No lesions.  ?Anus: Normal exam.  No lesions.  ?Perineum: Normal exam.  No lesions.  ?      Bimanual   ?Adnexae: No masses.  Non-tender to palpation.  ?Uterus: Enlarged.  16 weeks one 40 bpm non-tender.  Mobile.  AV.  ?Adnexae: No masses.  Non-tender to palpation.  ?Cul-de-sac: Negative for abnormality.  ?Adnexae: No masses.  Non-tender to palpation.  ? ?      Pelvimetry   ?Diagonal: Reached.  ?Spines: Average.  ?Sacrum: Concave.  ?Pubic Arch: Normal.  ? ? ? ?

## 2022-02-15 LAB — CYTOLOGY - PAP
Chlamydia: NEGATIVE
Comment: NEGATIVE
Comment: NORMAL
Diagnosis: NEGATIVE
Neisseria Gonorrhea: NEGATIVE

## 2022-02-19 LAB — AFP, SERUM, OPEN SPINA BIFIDA
AFP MoM: 1.06
AFP Value: 38.7 ng/mL
Gest. Age on Collection Date: 15.6 weeks
Maternal Age At EDD: 25.3 yr
OSBR Risk 1 IN: 10000
Test Results:: NEGATIVE
Weight: 127 [lb_av]

## 2022-02-22 LAB — MATERNIT21  PLUS CORE+ESS+SCA, BLOOD
11q23 deletion (Jacobsen): NOT DETECTED
15q11 deletion (PW Angelman): NOT DETECTED
1p36 deletion syndrome: NOT DETECTED
22q11 deletion (DiGeorge): NOT DETECTED
4p16 deletion(Wolf-Hirschhorn): NOT DETECTED
5p15 deletion (Cri-du-chat): NOT DETECTED
8q24 deletion (Langer-Giedion): NOT DETECTED
Fetal Fraction: 12
Monosomy X (Turner Syndrome): NOT DETECTED
Result (T21): NEGATIVE
Trisomy 13 (Patau syndrome): NEGATIVE
Trisomy 16: NOT DETECTED
Trisomy 18 (Edwards syndrome): NEGATIVE
Trisomy 21 (Down syndrome): NEGATIVE
Trisomy 22: NOT DETECTED
XXX (Triple X Syndrome): NOT DETECTED
XXY (Klinefelter Syndrome): NOT DETECTED
XYY (Jacobs Syndrome): NOT DETECTED

## 2022-03-13 ENCOUNTER — Ambulatory Visit (INDEPENDENT_AMBULATORY_CARE_PROVIDER_SITE_OTHER): Payer: Medicaid Other | Admitting: Obstetrics and Gynecology

## 2022-03-13 ENCOUNTER — Encounter: Payer: Self-pay | Admitting: Obstetrics and Gynecology

## 2022-03-13 ENCOUNTER — Ambulatory Visit (INDEPENDENT_AMBULATORY_CARE_PROVIDER_SITE_OTHER): Payer: Medicaid Other

## 2022-03-13 VITALS — BP 95/60 | HR 68 | Wt 128.9 lb

## 2022-03-13 DIAGNOSIS — Z3A19 19 weeks gestation of pregnancy: Secondary | ICD-10-CM

## 2022-03-13 DIAGNOSIS — Z3482 Encounter for supervision of other normal pregnancy, second trimester: Secondary | ICD-10-CM | POA: Diagnosis not present

## 2022-03-13 DIAGNOSIS — O26899 Other specified pregnancy related conditions, unspecified trimester: Secondary | ICD-10-CM

## 2022-03-13 DIAGNOSIS — Z3A15 15 weeks gestation of pregnancy: Secondary | ICD-10-CM

## 2022-03-13 DIAGNOSIS — Z8709 Personal history of other diseases of the respiratory system: Secondary | ICD-10-CM | POA: Insufficient documentation

## 2022-03-13 DIAGNOSIS — Z3402 Encounter for supervision of normal first pregnancy, second trimester: Secondary | ICD-10-CM | POA: Diagnosis not present

## 2022-03-13 DIAGNOSIS — Z6791 Unspecified blood type, Rh negative: Secondary | ICD-10-CM | POA: Insufficient documentation

## 2022-03-13 HISTORY — DX: Other specified pregnancy related conditions, unspecified trimester: O26.899

## 2022-03-13 LAB — POCT URINALYSIS DIPSTICK OB
Bilirubin, UA: NEGATIVE
Glucose, UA: NEGATIVE
Ketones, UA: NEGATIVE
Nitrite, UA: NEGATIVE
Spec Grav, UA: 1.015 (ref 1.010–1.025)
Urobilinogen, UA: 0.2 E.U./dL
pH, UA: 6.5 (ref 5.0–8.0)

## 2022-03-13 NOTE — Patient Instructions (Signed)

## 2022-03-13 NOTE — Progress Notes (Addendum)
ROB: She is doing well, no new concerns today.  S/p anatomy scan, incomplete. Will return in 3 weeks for completion. Discussed expectations of second trimester. Reviewed h/o asthma, currently only on albuterol, no recent significant exacerbations or hospitalizations. Informed of blood type, will need Rhogam during pregnancy. RTC in 4 weeks. The patient has Medicaid.  [employer] Medicaid Risk Screening Form completed today.  ? ?

## 2022-04-04 ENCOUNTER — Encounter: Payer: Self-pay | Admitting: Obstetrics and Gynecology

## 2022-04-04 ENCOUNTER — Ambulatory Visit (INDEPENDENT_AMBULATORY_CARE_PROVIDER_SITE_OTHER): Payer: Medicaid Other | Admitting: Obstetrics and Gynecology

## 2022-04-04 VITALS — BP 105/70 | HR 97 | Wt 131.1 lb

## 2022-04-04 DIAGNOSIS — Z3A22 22 weeks gestation of pregnancy: Secondary | ICD-10-CM

## 2022-04-04 DIAGNOSIS — Z3402 Encounter for supervision of normal first pregnancy, second trimester: Secondary | ICD-10-CM

## 2022-04-04 LAB — POCT URINALYSIS DIPSTICK OB
Bilirubin, UA: NEGATIVE
Blood, UA: NEGATIVE
Glucose, UA: NEGATIVE
Ketones, UA: NEGATIVE
Leukocytes, UA: NEGATIVE
Nitrite, UA: NEGATIVE
Spec Grav, UA: 1.015 (ref 1.010–1.025)
Urobilinogen, UA: 0.2 E.U./dL
pH, UA: 7 (ref 5.0–8.0)

## 2022-04-04 NOTE — Progress Notes (Signed)
ROB: No problems.  Reports daily fetal movement.  Taking vitamins as directed.  Incomplete anatomy scan rescheduled for next week.

## 2022-04-04 NOTE — Progress Notes (Signed)
ROB. Patient states fetal movement with no pain or pressure. She has follow-up anatomy ultrasound scheduled for 6/6. Patient states no questions or concerns at this time.

## 2022-04-07 ENCOUNTER — Encounter: Payer: Self-pay | Admitting: Obstetrics and Gynecology

## 2022-04-07 ENCOUNTER — Observation Stay
Admission: EM | Admit: 2022-04-07 | Discharge: 2022-04-07 | Disposition: A | Discharge status: Home / Self Care | Payer: Medicaid Other | Attending: Obstetrics and Gynecology | Admitting: Obstetrics and Gynecology

## 2022-04-07 ENCOUNTER — Other Ambulatory Visit: Payer: Self-pay

## 2022-04-07 DIAGNOSIS — R319 Hematuria, unspecified: Secondary | ICD-10-CM | POA: Diagnosis not present

## 2022-04-07 DIAGNOSIS — Z79899 Other long term (current) drug therapy: Secondary | ICD-10-CM | POA: Insufficient documentation

## 2022-04-07 DIAGNOSIS — Z3A23 23 weeks gestation of pregnancy: Secondary | ICD-10-CM | POA: Insufficient documentation

## 2022-04-07 DIAGNOSIS — O26892 Other specified pregnancy related conditions, second trimester: Secondary | ICD-10-CM | POA: Diagnosis not present

## 2022-04-07 DIAGNOSIS — O99512 Diseases of the respiratory system complicating pregnancy, second trimester: Secondary | ICD-10-CM | POA: Diagnosis not present

## 2022-04-07 DIAGNOSIS — J45909 Unspecified asthma, uncomplicated: Secondary | ICD-10-CM | POA: Diagnosis not present

## 2022-04-07 DIAGNOSIS — O4692 Antepartum hemorrhage, unspecified, second trimester: Secondary | ICD-10-CM | POA: Diagnosis present

## 2022-04-07 DIAGNOSIS — O26893 Other specified pregnancy related conditions, third trimester: Secondary | ICD-10-CM | POA: Diagnosis not present

## 2022-04-07 LAB — WET PREP, GENITAL
Clue Cells Wet Prep HPF POC: NONE SEEN
Sperm: NONE SEEN
Trich, Wet Prep: NONE SEEN
WBC, Wet Prep HPF POC: >=10 — AB (ref <10)
Yeast Wet Prep HPF POC: NONE SEEN

## 2022-04-07 LAB — URINALYSIS, ROUTINE W REFLEX MICROSCOPIC
Bacteria, UA: NONE SEEN
Bilirubin Urine: NEGATIVE
Glucose, UA: NEGATIVE mg/dL
Ketones, ur: NEGATIVE mg/dL
Nitrite: NEGATIVE
Protein, ur: NEGATIVE mg/dL
RBC / HPF: >50 RBC/hpf — ABNORMAL HIGH (ref 0–5)
RBC / HPF: >50 RBC/hpf — ABNORMAL HIGH (ref 0–5)
Specific Gravity, Urine: 1.017 (ref 1.005–1.030)
Specific Gravity, Urine: 1.015 (ref 1.005–1.030)
pH: 8 (ref 5.0–8.0)

## 2022-04-07 NOTE — OB Triage Note (Signed)
Pt reports vaginal bleeding since 10 this morning accompanied by cramps and lower back pain. Denies LOF, + fetal movement. Lori Stevenson

## 2022-04-07 NOTE — Discharge Summary (Signed)
Please see Final Progress Note.  Mirna Mires, CNM  04/07/2022 2:39 PM

## 2022-04-07 NOTE — Final Progress Note (Signed)
Final Progress Note  Patient ID: Lori Stevenson MRN: [id number] DOB/AGE: 1995/11/21 25 y.o.  Admit date: 04/07/2022 Admitting provider: No admitting provider for patient encounter. Discharge date: 04/07/2022   Admission Diagnoses: hematuria  Discharge Diagnoses:  Hematuria in pregnancy   History of Present Illness: The patient is a 26 y.o. female G1P0 at [redacted]w[redacted]d who presents for evaluation of some bloody urine she saw in the toilet after voiding. She denies any urgency to void or dysuria. Per her report, she and her partner went hiking for several miles. She denies any recent intercourse. Denies cramping, denies any hemorrhoids. Her baby is moving well. She receives care at [facility].  Past Medical History:  Diagnosis Date   Asthma     Past Surgical History:  Procedure Laterality Date   NO PAST SURGERIES      No current facility-administered medications on file prior to encounter.   Current Outpatient Medications on File Prior to Encounter  Medication Sig Dispense Refill   albuterol (VENTOLIN HFA) 108 (90 Base) MCG/ACT inhaler Inhale 2 puffs into the lungs every 6 (six) hours as needed for wheezing or shortness of breath. 1 each 2   Prenatal Vit-Fe Fumarate-FA (PRENATAL PO) Take by mouth.     albuterol (PROVENTIL) (2.5 MG/3ML) 0.083% nebulizer solution Take 3 mLs (2.5 mg total) by nebulization every 6 (six) hours as needed for wheezing or shortness of breath. 75 mL 12   [DISCONTINUED] omeprazole (PRILOSEC OTC) 20 MG tablet Take 1 tablet (20 mg total) by mouth daily. 28 tablet 1   [DISCONTINUED] promethazine (PHENERGAN) 25 MG tablet Take 25 mg by mouth every 6 (six) hours as needed for nausea or vomiting.     [DISCONTINUED] sucralfate (CARAFATE) 1 g tablet Take 1 tablet (1 g total) by mouth 4 (four) times daily as needed (for abdominal discomfort, nausea, and/or vomiting). 30 tablet 1    No Known Allergies  Social History   Socioeconomic History   Marital status:  Significant Other    Spouse name: Ethelene Browns   Number of children: Not on file   Years of education: Not on file   Highest education level: Not on file  Occupational History   Not on file  Tobacco Use   Smoking status: Never    Passive exposure: Never   Smokeless tobacco: Never  Vaping Use   Vaping Use: Never used  Substance and Sexual Activity   Alcohol use: No   Drug use: No   Sexual activity: Yes    Partners: Male    Comment: undecided  Other Topics Concern   Not on file  Social History Narrative   Not on file   Social Determinants of Health   Financial Resource Strain: Not on file  Food Insecurity: Not on file  Transportation Needs: Not on file  Physical Activity: Not on file  Stress: Not on file  Social Connections: Not on file  Intimate Partner Violence: Not on file    Family History  Problem Relation Age of Onset   Thyroid disease Mother    Breast cancer Neg Hx    Colon cancer Neg Hx      Review of Systems  Constitutional: Negative.   HENT: Negative.    Eyes: Negative.   Respiratory: Negative.    Cardiovascular: Negative.   Gastrointestinal: Negative.   Genitourinary:  Positive for hematuria.       Noticed that her urine looked red in the toilet this morning  Musculoskeletal: Negative.   Skin: Negative.  Neurological: Negative.   Endo/Heme/Allergies: Negative.     Physical Exam: BP 98/69 (BP Location: Left Arm)   Pulse 70   Temp 98.2 F (36.8 C) (Oral)   Resp 16   Ht 4\' 10"  (1.473 m)   Wt 59.4 kg   LMP 10/28/2021 (Exact Date)   BMI 27.38 kg/m   Physical Exam Constitutional:      Appearance: Normal appearance. She is normal weight.  Genitourinary:     Genitourinary Comments: Sterile speculum exam reveals normal vaginal rugae. No visible blood seen Cervix appears closed. No external hemorrhoids.  HENT:     Head: Normocephalic and atraumatic.  Cardiovascular:     Rate and Rhythm: Normal rate and regular rhythm.     Pulses: Normal pulses.      Heart sounds: Normal heart sounds.  Pulmonary:     Effort: Pulmonary effort is normal.     Breath sounds: Normal breath sounds.  Abdominal:     Palpations: Abdomen is soft.     Comments: + FHTs per doppler  Musculoskeletal:        General: Normal range of motion.     Cervical back: Normal range of motion and neck supple.     Comments: No costovertebral tenderness noted.  Neurological:     General: No focal deficit present.     Mental Status: She is alert and oriented to person, place, and time.  Skin:    General: Skin is warm and dry.  Psychiatric:        Mood and Affect: Mood normal.        Behavior: Behavior normal.    Consults: None  Significant Findings/ Diagnostic Studies: labs:  Results for orders placed or performed during the hospital encounter of 04/07/22 (from the past 24 hour(s))  Urinalysis, Routine w reflex microscopic Urine, Clean Catch     Status: Abnormal   Collection Time: 04/07/22 11:37 AM  Result Value Ref Range   Color, Urine RED (A) YELLOW   APPearance HAZY (A) CLEAR   Specific Gravity, Urine 1.017 1.005 - 1.030   pH  5.0 - 8.0    TEST NOT REPORTED DUE TO COLOR INTERFERENCE OF URINE PIGMENT   Glucose, UA (A) NEGATIVE mg/dL    TEST NOT REPORTED DUE TO COLOR INTERFERENCE OF URINE PIGMENT   Hgb urine dipstick (A) NEGATIVE    TEST NOT REPORTED DUE TO COLOR INTERFERENCE OF URINE PIGMENT   Bilirubin Urine (A) NEGATIVE    TEST NOT REPORTED DUE TO COLOR INTERFERENCE OF URINE PIGMENT   Ketones, ur (A) NEGATIVE mg/dL    TEST NOT REPORTED DUE TO COLOR INTERFERENCE OF URINE PIGMENT   Protein, ur (A) NEGATIVE mg/dL    TEST NOT REPORTED DUE TO COLOR INTERFERENCE OF URINE PIGMENT   Nitrite (A) NEGATIVE    TEST NOT REPORTED DUE TO COLOR INTERFERENCE OF URINE PIGMENT   Leukocytes,Ua (A) NEGATIVE    TEST NOT REPORTED DUE TO COLOR INTERFERENCE OF URINE PIGMENT   RBC / HPF >50 (H) 0 - 5 RBC/hpf   WBC, UA 0-5 0 - 5 WBC/hpf   Bacteria, UA NONE SEEN NONE SEEN    Squamous Epithelial / LPF 0-5 0 - 5   Mucus PRESENT   Urinalysis, Routine w reflex microscopic Urine, Clean Catch     Status: Abnormal   Collection Time: 04/07/22 12:54 PM  Result Value Ref Range   Color, Urine YELLOW (A) YELLOW   APPearance CLOUDY (A) CLEAR   Specific Gravity, Urine 1.015  1.005 - 1.030   pH 8.0 5.0 - 8.0   Glucose, UA NEGATIVE NEGATIVE mg/dL   Hgb urine dipstick LARGE (A) NEGATIVE   Bilirubin Urine NEGATIVE NEGATIVE   Ketones, ur NEGATIVE NEGATIVE mg/dL   Protein, ur NEGATIVE NEGATIVE mg/dL   Nitrite NEGATIVE NEGATIVE   Leukocytes,Ua SMALL (A) NEGATIVE   RBC / HPF >50 (H) 0 - 5 RBC/hpf   WBC, UA 0-5 0 - 5 WBC/hpf   Bacteria, UA RARE (A) NONE SEEN   Squamous Epithelial / LPF 0-5 0 - 5   Mucus PRESENT   Wet prep, genital     Status: Abnormal   Collection Time: 04/07/22  1:26 PM   Specimen: Vaginal  Result Value Ref Range   Yeast Wet Prep HPF POC NONE SEEN NONE SEEN   Trich, Wet Prep NONE SEEN NONE SEEN   Clue Cells Wet Prep HPF POC NONE SEEN NONE SEEN   WBC, Wet Prep HPF POC >=10 (A) <10   Sperm NONE SEEN      Procedures: fetal heart tones with doppler only due to severe prematurity. NST Tocometry: no contractions per toco or palpation  Interpretation:  INDICATIONS: patient reassurance RESULTS:  A NST procedure was performed with FHR monitoring and a normal baseline established, appropriate time of 20-40 minutes of evaluation, and accels >2 seen w 15x15 characteristics.  Results show a REACTIVE NST.    Hospital Course: The patient was admitted to Labor and Delivery Triage for observation. Fetal heart tones were checked with the doppler and were in the 140s and reassuring. A clean catch urine sample was sent as well as a wet prep. As her urine did not indicate UTI, it was sent for culture due to the hematuria. As patient hydrated and again voided, she reported that her urine was lighter in color and less redd than previously. She was then discharged home with  plans for f/u at [facility]. She is instructed to hydrate well and monitor her urine.  Discharge Condition: good  Disposition: Discharge disposition: 01-Home or Self Care       Diet: Regular diet  Discharge Activity: Activity as tolerated  Discharge Instructions     Discharge activity:  No Restrictions   Complete by: As directed    Discharge diet:  No restrictions   Complete by: As directed    No sexual activity restrictions   Complete by: As directed    Notify physician for a general feeling that "something is not right"   Complete by: As directed    Notify physician for increase or change in vaginal discharge   Complete by: As directed    Notify physician for intestinal cramps, with or without diarrhea, sometimes described as "gas pain"   Complete by: As directed    Notify physician for leaking of fluid   Complete by: As directed    Notify physician for low, dull backache, unrelieved by heat or Tylenol   Complete by: As directed    Notify physician for menstrual like cramps   Complete by: As directed    Notify physician for pelvic pressure   Complete by: As directed    Notify physician for uterine contractions.  These may be painless and feel like the uterus is tightening or the baby is  "balling up"   Complete by: As directed    Notify physician for vaginal bleeding   Complete by: As directed    PRETERM LABOR:  Includes any of the follwing symptoms that occur between 20 -  [redacted] weeks gestation.  If these symptoms are not stopped, preterm labor can result in preterm delivery, placing your baby at risk   Complete by: As directed       Allergies as of 04/07/2022   No Known Allergies      Medication List     TAKE these medications    albuterol 108 (90 Base) MCG/ACT inhaler Commonly known as: VENTOLIN HFA Inhale 2 puffs into the lungs every 6 (six) hours as needed for wheezing or shortness of breath. What changed: Another medication with the same name was removed.  Continue taking this medication, and follow the directions you see here.   PRENATAL PO Take by mouth.         Total time spent taking care of this patient: in direct care, review of records and lab results as well as documentation.  Signed: Mirna Mires, CNM  04/07/2022, 2:55 PM

## 2022-04-07 NOTE — Progress Notes (Signed)
Discharged home. Questions answered. Left floor ambulatory with significant other. Lori Stevenson

## 2022-04-08 LAB — URINE CULTURE: Culture: NO GROWTH

## 2022-04-08 LAB — RPR: RPR Ser Ql: NONREACTIVE

## 2022-04-09 ENCOUNTER — Ambulatory Visit (INDEPENDENT_AMBULATORY_CARE_PROVIDER_SITE_OTHER): Payer: Medicaid Other

## 2022-04-09 ENCOUNTER — Other Ambulatory Visit: Payer: Self-pay | Admitting: Obstetrics and Gynecology

## 2022-04-09 DIAGNOSIS — Z3A23 23 weeks gestation of pregnancy: Secondary | ICD-10-CM | POA: Diagnosis not present

## 2022-04-09 DIAGNOSIS — Z362 Encounter for other antenatal screening follow-up: Secondary | ICD-10-CM

## 2022-04-13 ENCOUNTER — Observation Stay
Admission: EM | Admit: 2022-04-13 | Discharge: 2022-04-14 | Disposition: A | Discharge status: Home / Self Care | Payer: Medicaid Other | Attending: Certified Nurse Midwife | Admitting: Certified Nurse Midwife

## 2022-04-13 ENCOUNTER — Observation Stay: Payer: Medicaid Other

## 2022-04-13 ENCOUNTER — Encounter: Payer: Self-pay | Admitting: Internal Medicine

## 2022-04-13 ENCOUNTER — Other Ambulatory Visit: Payer: Self-pay

## 2022-04-13 DIAGNOSIS — O26832 Pregnancy related renal disease, second trimester: Secondary | ICD-10-CM | POA: Diagnosis not present

## 2022-04-13 DIAGNOSIS — R319 Hematuria, unspecified: Secondary | ICD-10-CM

## 2022-04-13 DIAGNOSIS — Z3A24 24 weeks gestation of pregnancy: Secondary | ICD-10-CM | POA: Insufficient documentation

## 2022-04-13 DIAGNOSIS — N1330 Unspecified hydronephrosis: Secondary | ICD-10-CM | POA: Insufficient documentation

## 2022-04-13 DIAGNOSIS — O26892 Other specified pregnancy related conditions, second trimester: Secondary | ICD-10-CM | POA: Diagnosis present

## 2022-04-13 LAB — URINALYSIS, ROUTINE W REFLEX MICROSCOPIC
Bilirubin Urine: NEGATIVE
Glucose, UA: NEGATIVE mg/dL
Ketones, ur: 5 mg/dL — AB
Nitrite: NEGATIVE
Protein, ur: 100 mg/dL — AB
RBC / HPF: >50 RBC/hpf — ABNORMAL HIGH (ref 0–5)
Specific Gravity, Urine: 1.026 (ref 1.005–1.030)
pH: 6 (ref 5.0–8.0)

## 2022-04-13 LAB — CBC
HCT: 31.7 % — ABNORMAL LOW (ref 36.0–46.0)
Hemoglobin: 10.9 g/dL — ABNORMAL LOW (ref 12.0–15.0)
MCH: 29.8 pg (ref 26.0–34.0)
MCHC: 34.4 g/dL (ref 30.0–36.0)
MCV: 86.6 fL (ref 80.0–100.0)
Platelets: 291 10*3/uL (ref 150–400)
RBC: 3.66 MIL/uL — ABNORMAL LOW (ref 3.87–5.11)
RDW: 12.3 % (ref 11.5–15.5)
WBC: 10.2 10*3/uL (ref 4.0–10.5)
nRBC: 0 % (ref 0.0–0.2)

## 2022-04-13 MED ORDER — MORPHINE SULFATE (PF) 2 MG/ML IV SOLN
2.0000 mg | INTRAVENOUS | Status: DC | PRN
  Administered 2022-04-13: 2 mg via INTRAVENOUS
  Filled 2022-04-13: qty 1

## 2022-04-13 MED ORDER — LACTATED RINGERS IV SOLN: INTRAVENOUS | Status: DC

## 2022-04-13 MED ORDER — FENTANYL CITRATE (PF) 100 MCG/2ML IJ SOLN
50.0000 ug | Freq: Once | INTRAMUSCULAR | Status: AC
  Administered 2022-04-13: 50 ug via INTRAVENOUS
  Filled 2022-04-13: qty 2

## 2022-04-13 MED ORDER — MORPHINE SULFATE (PF) 4 MG/ML IV SOLN
4.0000 mg | INTRAVENOUS | Status: DC | PRN
  Filled 2022-04-13: qty 1

## 2022-04-13 MED ORDER — ONDANSETRON 4 MG PO TBDP
4.0000 mg | ORAL_TABLET | Freq: Four times a day (QID) | ORAL | Status: DC
  Administered 2022-04-14: 4 mg via ORAL
  Filled 2022-04-13: qty 1

## 2022-04-13 MED ORDER — OXYCODONE-ACETAMINOPHEN 5-325 MG PO TABS
1.0000 | ORAL_TABLET | Freq: Four times a day (QID) | ORAL | Status: DC | PRN
  Administered 2022-04-13: 2 via ORAL
  Administered 2022-04-14: 1 via ORAL
  Filled 2022-04-13: qty 1
  Filled 2022-04-13: qty 2

## 2022-04-13 MED ORDER — ONDANSETRON HCL 4 MG/2ML IJ SOLN
4.0000 mg | Freq: Four times a day (QID) | INTRAMUSCULAR | Status: DC | PRN
  Administered 2022-04-13: 4 mg via INTRAVENOUS
  Filled 2022-04-13: qty 2

## 2022-04-13 MED ORDER — MORPHINE SULFATE (PF) 2 MG/ML IV SOLN
INTRAVENOUS | Status: AC
  Administered 2022-04-13: 2 mg via INTRAVENOUS
  Filled 2022-04-13: qty 1

## 2022-04-13 NOTE — OB Triage Note (Addendum)
Lori Stevenson 25 y.o. presents to labor and delivery triage reporting constant pelvic and back pain of 10/10 since 1900, worsening within the last 30 minutes. She states she has difficulty urinating despite feeling the urge and had a spot of blood when wiping.  She states she has been hydrating well and has consumed a bottle of water during initial triage. She is a G1P0 at [redacted]w[redacted]d . She denies signs and symptoms consistent with rupture of membranes. She denies contractions and states positive fetal movement. External FM and TOCO applied to non-tender abdomen and assessing. Initial FHR 135. Vital signs obtained and within normal limits. Doreene Burke, CNM notified of pt. SBAR report given.

## 2022-04-13 NOTE — ED Notes (Signed)
LWBS charted in error

## 2022-04-13 NOTE — Progress Notes (Signed)
Pt transported via wheelchair to [place] with support person by [facility] transport.

## 2022-04-14 ENCOUNTER — Other Ambulatory Visit: Payer: Self-pay | Admitting: Certified Nurse Midwife

## 2022-04-14 DIAGNOSIS — R319 Hematuria, unspecified: Secondary | ICD-10-CM

## 2022-04-14 DIAGNOSIS — O26892 Other specified pregnancy related conditions, second trimester: Secondary | ICD-10-CM | POA: Diagnosis not present

## 2022-04-14 DIAGNOSIS — Z3A24 24 weeks gestation of pregnancy: Secondary | ICD-10-CM | POA: Diagnosis not present

## 2022-04-14 DIAGNOSIS — O26832 Pregnancy related renal disease, second trimester: Secondary | ICD-10-CM | POA: Diagnosis not present

## 2022-04-14 DIAGNOSIS — N1339 Other hydronephrosis: Secondary | ICD-10-CM

## 2022-04-14 DIAGNOSIS — N3001 Acute cystitis with hematuria: Secondary | ICD-10-CM

## 2022-04-14 DIAGNOSIS — R109 Unspecified abdominal pain: Secondary | ICD-10-CM

## 2022-04-14 DIAGNOSIS — R102 Pelvic and perineal pain: Secondary | ICD-10-CM | POA: Diagnosis not present

## 2022-04-14 MED ORDER — NITROFURANTOIN MONOHYD MACRO 100 MG PO CAPS
100.0000 mg | ORAL_CAPSULE | Freq: Every day | ORAL | Status: DC
  Administered 2022-04-14: 100 mg via ORAL
  Filled 2022-04-14: qty 1

## 2022-04-14 MED ORDER — NITROFURANTOIN MONOHYD MACRO 100 MG PO CAPS: 100.0000 mg | ORAL_CAPSULE | Freq: Every day | ORAL | 1 refills | Status: DC

## 2022-04-14 MED ORDER — OXYCODONE-ACETAMINOPHEN 5-325 MG PO TABS: 1.0000 | ORAL_TABLET | Freq: Four times a day (QID) | ORAL | 0 refills | Status: DC | PRN

## 2022-04-14 MED ORDER — ONDANSETRON 4 MG PO TBDP: 4.0000 mg | ORAL_TABLET | Freq: Four times a day (QID) | ORAL | 0 refills | Status: DC

## 2022-04-14 NOTE — OB Triage Note (Signed)
    L&D OB Triage Note  SUBJECTIVE Lori Stevenson is a 26 y.o. G1P0 female at [redacted]w[redacted]d, EDD Estimated Date of Delivery: 08/04/22 who presented to triage with complaints of flank pain 10/10 and nausea. She was seen in the hospital last week and was noted to have hematuria. She feels good fetal movement , denies contractions, loss of fluid , and vaginal bleeding.   OB History  Gravida Para Term Preterm AB Living  1 0 0 0 0 0  SAB IAB Ectopic Multiple Live Births  0 0 0 0 0    # Outcome Date GA Lbr Len/2nd Weight Sex Delivery Anes PTL Lv  1 Current             Medications Prior to Admission  Medication Sig Dispense Refill Last Dose   albuterol (VENTOLIN HFA) 108 (90 Base) MCG/ACT inhaler Inhale 2 puffs into the lungs every 6 (six) hours as needed for wheezing or shortness of breath. 1 each 2 Past Week   Prenatal Vit-Fe Fumarate-FA (PRENATAL PO) Take by mouth.   04/13/2022     OBJECTIVE  Nursing Evaluation:   BP (!) 99/52 (BP Location: Left Arm)   Pulse 82   Temp 98.1 F (36.7 C) (Oral)   Resp 16   Ht 4\' 10"  (1.473 m)   Wt 59.4 kg   LMP 10/28/2021 (Exact Date)   SpO2 99%   BMI 27.38 kg/m    Findings:   CLINICAL DATA:  Pregnant, flank pain.   EXAM: RENAL / URINARY TRACT ULTRASOUND COMPLETE   COMPARISON:  None Available.   FINDINGS: Right Kidney:   Renal measurements: 9.8 x 6.1 x 6.1 cm = volume: 190 mL. Renal cortical echogenicity is normal and cortical thickness is preserved. There is mild right hydronephrosis. No intrarenal masses or calcifications are seen. No perinephric fluid collections are present.   Left Kidney:   Renal measurements: 10.7 x 4.7 x 4.3 cm = volume: 114 mL. Echogenicity within normal limits. No mass or hydronephrosis visualized.   Bladder:   Largely decompressed   Other:   None.   IMPRESSION: Mild right hydronephrosis.     Electronically Signed   By: 10/30/2021 M.D.   On: 04/13/2022 21:35           NST was performed and  has been reviewed by me.  NST INTERPRETATION: Category I, appropriate for gestational age  Mode: External Baseline Rate (A): 130 bpm Variability: Minimal, Moderate Accelerations: 10 x 10 Decelerations: None     Contraction Frequency (min): none noted, denies ctx  ASSESSMENT Impression:  1.  Pregnancy:  G1P0 at [redacted]w[redacted]d , EDD Estimated Date of Delivery: 08/04/22 2.  Reassuring fetal and maternal status 3.  Flank pain, Mild hydronephrosis with nausea & vomiting  PLAN Patient was managed with IF fluid, pain medication , and nausea meds. She was transitioned to PO and pain, nausea/pain was controlled with these medications. She will have outpatient follow up with urology. She was placed on daily Macrobid for prophylaxis.   1. Current condition and above findings reviewed.  Reassuring fetal and maternal condition. 2. Discharge home with standard labor precautions given to return to L&D or call the office for problems. 3. Continue routine prenatal care.  Dr. 10/04/22 consulted on plan of care and in agreement.   I was present and evaluated this pt in person. Okey Dupre, CNM

## 2022-04-14 NOTE — Progress Notes (Signed)
am

## 2022-04-16 ENCOUNTER — Observation Stay
Admission: EM | Admit: 2022-04-16 | Discharge: 2022-04-17 | Disposition: A | Discharge status: Home / Self Care | Payer: Medicaid Other | Attending: Obstetrics | Admitting: Obstetrics

## 2022-04-16 ENCOUNTER — Encounter: Payer: Self-pay | Admitting: Obstetrics

## 2022-04-16 ENCOUNTER — Other Ambulatory Visit: Payer: Self-pay

## 2022-04-16 DIAGNOSIS — O26832 Pregnancy related renal disease, second trimester: Principal | ICD-10-CM | POA: Insufficient documentation

## 2022-04-16 DIAGNOSIS — N1330 Unspecified hydronephrosis: Secondary | ICD-10-CM | POA: Insufficient documentation

## 2022-04-16 DIAGNOSIS — O26892 Other specified pregnancy related conditions, second trimester: Secondary | ICD-10-CM | POA: Diagnosis present

## 2022-04-16 DIAGNOSIS — M545 Low back pain, unspecified: Secondary | ICD-10-CM | POA: Insufficient documentation

## 2022-04-16 DIAGNOSIS — Z3A24 24 weeks gestation of pregnancy: Secondary | ICD-10-CM | POA: Insufficient documentation

## 2022-04-16 LAB — URINALYSIS, COMPLETE (UACMP) WITH MICROSCOPIC
Bilirubin Urine: NEGATIVE
Glucose, UA: NEGATIVE mg/dL
Ketones, ur: 5 mg/dL — AB
Leukocytes,Ua: NEGATIVE
Nitrite: NEGATIVE
Protein, ur: NEGATIVE mg/dL
Specific Gravity, Urine: 1.023 (ref 1.005–1.030)
pH: 7 (ref 5.0–8.0)

## 2022-04-16 LAB — COMPREHENSIVE METABOLIC PANEL
ALT: 20 U/L (ref 0–44)
AST: 25 U/L (ref 15–41)
Albumin: 3.1 g/dL — ABNORMAL LOW (ref 3.5–5.0)
Alkaline Phosphatase: 51 U/L (ref 38–126)
Anion gap: 10 (ref 5–15)
BUN: 10 mg/dL (ref 6–20)
CO2: 24 mmol/L (ref 22–32)
Calcium: 9.1 mg/dL (ref 8.9–10.3)
Chloride: 103 mmol/L (ref 98–111)
Creatinine, Ser: 0.54 mg/dL (ref 0.44–1.00)
GFR, Estimated: >60 mL/min (ref >60)
Glucose, Bld: 111 mg/dL — ABNORMAL HIGH (ref 70–99)
Potassium: 3.5 mmol/L (ref 3.5–5.1)
Sodium: 137 mmol/L (ref 135–145)
Total Bilirubin: 0.4 mg/dL (ref 0.3–1.2)
Total Protein: 6.7 g/dL (ref 6.5–8.1)

## 2022-04-16 LAB — CBC
HCT: 31.9 % — ABNORMAL LOW (ref 36.0–46.0)
Hemoglobin: 10.9 g/dL — ABNORMAL LOW (ref 12.0–15.0)
MCH: 30 pg (ref 26.0–34.0)
MCHC: 34.2 g/dL (ref 30.0–36.0)
MCV: 87.9 fL (ref 80.0–100.0)
Platelets: 300 10*3/uL (ref 150–400)
RBC: 3.63 MIL/uL — ABNORMAL LOW (ref 3.87–5.11)
RDW: 12.3 % (ref 11.5–15.5)
WBC: 10.2 10*3/uL (ref 4.0–10.5)
nRBC: 0 % (ref 0.0–0.2)

## 2022-04-16 MED ORDER — MORPHINE SULFATE (PF) 2 MG/ML IV SOLN
2.0000 mg | INTRAVENOUS | Status: DC | PRN
  Administered 2022-04-16 – 2022-04-17 (×2): 2 mg via INTRAVENOUS
  Filled 2022-04-16 (×2): qty 1

## 2022-04-16 MED ORDER — MORPHINE SULFATE (PF) 4 MG/ML IV SOLN
4.0000 mg | Freq: Once | INTRAVENOUS | Status: AC
  Administered 2022-04-16: 4 mg via INTRAVENOUS
  Filled 2022-04-16: qty 1

## 2022-04-16 MED ORDER — ONDANSETRON HCL 4 MG/2ML IJ SOLN
4.0000 mg | Freq: Four times a day (QID) | INTRAMUSCULAR | Status: DC | PRN
  Administered 2022-04-16: 4 mg via INTRAVENOUS
  Filled 2022-04-16: qty 2

## 2022-04-16 MED ORDER — LACTATED RINGERS IV SOLN: INTRAVENOUS | Status: DC

## 2022-04-16 NOTE — OB Triage Note (Signed)
LABOR & DELIVERY OB TRIAGE NOTE  SUBJECTIVE  HPI Lori Stevenson is a 26 y.o. G1P0 at [redacted]w[redacted]d who presents to Labor & Delivery for right flank pain that is not relieved with PO pain medication. The pain wraps around from her right low back and goes into her groin. Her symptoms started on Saturday with UTI symptoms and right flank pain. She had urinary frequency with frank hematuria at that time. She came to the hospital for assessment and was found to have right-sided hydronephrosis. She was treated with IV hydration and pain medication and sent home with a prescription for Percocet and Macrobid. She states that the Percocet worked initially, but today it did not improve her pain and it made her feel like she was going to vomit and pass out. She has been afebrile and is not currently nauseated. She has been able to eat, drink, and tolerate the antibiotics. She is able to void. She denies abdominal pain, contractions, LOF, and vaginal bleeding. She endorses good fetal movement. She has an appointment with urology tomorrow afternoon.  OB History     Gravida  1   Para      Term      Preterm      AB      Living         SAB      IAB      Ectopic      Multiple      Live Births              Scheduled Meds:   morphine injection  4 mg Intravenous Once   Continuous Infusions:  lactated ringers     PRN Meds:.morphine injection, ondansetron (ZOFRAN) IV  OBJECTIVE  BP (!) 108/59   Pulse 70   Temp 98.8 F (37.1 C) (Oral)   Resp 16   LMP 10/28/2021 (Exact Date)   Exam Heart: RRR, no murmur Lungs: CTAB Abdomen: soft, gravid, non-tender, normal bowel sounds Back: No CVA tenderness. Tenderness to palpation on lower back on right side above hip  NST I reviewed the NST and it was reactive.  Baseline: 130 Variability: moderate Accelerations: present, 15x15 Decelerations:none Toco: no contractions Category 1  ASSESSMENT Impression  1) Pregnancy at G1P0, [redacted]w[redacted]d,  Estimated Date of Delivery: 08/04/22 2) Reassuring maternal/fetal status 3) Acute pain from hydronephrosis  PLAN  1) Pain management and IV hydration.  2) CBC, CMP 3) Will discharge when pain is manageable with plan to keep urology appointment tomorrow.  Reviewed plan with Dr. Okey Dupre.  [name], CNM

## 2022-04-16 NOTE — OB Triage Note (Signed)
Pt is G1P0 at 24.2 with c/o pain medication not working to reliever her kidney stone pain. She states it has never really helped her it just makes her feel dizzy and lightheaded but the pain remains. Pain is 7/10. Pt has an appointment with nephrology tomorrow (6/14) at 1:30p. States she feels +FM. Placed on monitor for NST

## 2022-04-17 ENCOUNTER — Ambulatory Visit (INDEPENDENT_AMBULATORY_CARE_PROVIDER_SITE_OTHER): Payer: Medicaid Other | Admitting: Urology

## 2022-04-17 ENCOUNTER — Encounter: Payer: Self-pay | Admitting: Urology

## 2022-04-17 VITALS — BP 102/66 | HR 80 | Ht <= 58 in | Wt 131.0 lb

## 2022-04-17 DIAGNOSIS — O99891 Other specified diseases and conditions complicating pregnancy: Secondary | ICD-10-CM

## 2022-04-17 DIAGNOSIS — N1330 Unspecified hydronephrosis: Secondary | ICD-10-CM | POA: Diagnosis not present

## 2022-04-17 DIAGNOSIS — R319 Hematuria, unspecified: Secondary | ICD-10-CM

## 2022-04-17 DIAGNOSIS — Z3A24 24 weeks gestation of pregnancy: Secondary | ICD-10-CM

## 2022-04-17 DIAGNOSIS — O26832 Pregnancy related renal disease, second trimester: Secondary | ICD-10-CM | POA: Diagnosis not present

## 2022-04-17 DIAGNOSIS — R109 Unspecified abdominal pain: Secondary | ICD-10-CM

## 2022-04-17 DIAGNOSIS — R8281 Pyuria: Secondary | ICD-10-CM

## 2022-04-17 LAB — URINALYSIS, COMPLETE
Bilirubin, UA: NEGATIVE
Glucose, UA: NEGATIVE
Ketones, UA: NEGATIVE
Nitrite, UA: NEGATIVE
Protein,UA: NEGATIVE
RBC, UA: NEGATIVE
Specific Gravity, UA: 1.02 (ref 1.005–1.030)
Urobilinogen, Ur: 0.2 mg/dL (ref 0.2–1.0)
pH, UA: 7 (ref 5.0–7.5)

## 2022-04-17 LAB — MICROSCOPIC EXAMINATION: WBC, UA: >30 /hpf — AB (ref 0–5)

## 2022-04-17 MED ORDER — LIDOCAINE HCL (PF) 1 % IJ SOLN
INTRAMUSCULAR | Status: AC
  Filled 2022-04-17: qty 30

## 2022-04-17 MED ORDER — CEFUROXIME AXETIL 250 MG PO TABS: 250.0000 mg | ORAL_TABLET | Freq: Two times a day (BID) | ORAL | 0 refills | Status: DC

## 2022-04-17 NOTE — OB Triage Note (Addendum)
LABOR & DELIVERY OB TRIAGE NOTE  SUBJECTIVE  HPI Lori Stevenson is a 26 y.o. G1P0 at [redacted]w[redacted]d who presents to Labor & Delivery for flank pain in the setting of hydronephrosis. She has received IV fluids and morphine, and her pain is much improved. She would like to go home and rest until her appointment tomorrow.  OB History     Gravida  1   Para      Term      Preterm      AB      Living         SAB      IAB      Ectopic      Multiple      Live Births              Scheduled Meds:  lidocaine (PF)       Continuous Infusions:  lactated ringers 125 mL/hr at 04/16/22 2012   PRN Meds:.lidocaine (PF), morphine injection, ondansetron (ZOFRAN) IV  OBJECTIVE  BP (!) 108/59   Pulse 70   Temp 98.8 F (37.1 C) (Oral)   Resp 16   LMP 10/28/2021 (Exact Date)   FHT: 135  ASSESSMENT Impression  1) Pregnancy at G1P0, [redacted]w[redacted]d, Estimated Date of Delivery: 08/04/22 2) Reassuring maternal/fetal status 3) Acute pain from hydronephrosis - resolved  PLAN  1) Discharge home with standard return/labor precautions 2) Follow up with urology tomorrow 3) Keep scheduled ROB appointment.  Lloyd Huger, CNM

## 2022-04-17 NOTE — Progress Notes (Signed)
04/17/2022 2:09 PM   Lewayne Bunting 10-16-1996 [id number]  Referring provider: Doreene Burke, CNM [address] [place],  [place] [address]  Chief Complaint  Patient presents with   Hematuria    HPI: Lori Stevenson is a 26 y.o. female referred for flank pain/hematuria  G1P0 at 24 weeks Onset 04/13/2022 of suprapubic discomfort frequency, intense urgency, dysuria and right flank pain RUS performed which showed mild right hydronephrosis was discharged 6/11 however return to [facility] 6/13 complaining of persistent pain Urinalysis 21-50 RBC; previous urine culture 6/4 was negative Treated with IV pain medication, analgesics and discharged later that evening.  Was placed on low-dose prophylaxis with nitrofurantoin No previous history of urologic problems or stone disease She is feeling better with minimal pain at present.  She does have mild dysuria  PMH: Past Medical History:  Diagnosis Date   Asthma     Surgical History: Past Surgical History:  Procedure Laterality Date   NO PAST SURGERIES      Home Medications:  Allergies as of 04/17/2022   No Known Allergies      Medication List        Accurate as of April 17, 2022  2:09 PM. If you have any questions, ask your nurse or doctor.          albuterol 108 (90 Base) MCG/ACT inhaler Commonly known as: VENTOLIN HFA Inhale 2 puffs into the lungs every 6 (six) hours as needed for wheezing or shortness of breath.   nitrofurantoin (macrocrystal-monohydrate) 100 MG capsule Commonly known as: MACROBID Take 1 capsule (100 mg total) by mouth daily.   ondansetron 4 MG disintegrating tablet Commonly known as: ZOFRAN-ODT Take 1 tablet (4 mg total) by mouth every 6 (six) hours.   oxyCODONE-acetaminophen 5-325 MG tablet Commonly known as: PERCOCET/ROXICET Take 1-2 tablets by mouth every 6 (six) hours as needed for severe pain.   PRENATAL PO Take by mouth.        Allergies: No Known Allergies  Family  History: Family History  Problem Relation Age of Onset   Thyroid disease Mother    Breast cancer Neg Hx    Colon cancer Neg Hx     Social History:  reports that she has never smoked. She has never been exposed to tobacco smoke. She has never used smokeless tobacco. She reports that she does not drink alcohol and does not use drugs.   Physical Exam: BP 102/66   Pulse 80   Ht 4\' 10"  (1.473 m)   Wt 131 lb (59.4 kg)   LMP 10/28/2021 (Exact Date)   BMI 27.38 kg/m   Constitutional:  Alert and oriented, No acute distress. HEENT: [place] AT Respiratory: Normal respiratory effort, no increased work of breathing. GI: Abdomen is soft, nontender, nondistended, no abdominal masses GU: No CVA tenderness Skin: No rashes, bruises or suspicious lesions. Neurologic: Grossly intact, no focal deficits, moving all 4 extremities. Psychiatric: Normal mood and affect.  Laboratory Data:  Urinalysis Dipstick 2+ leukocytes/microscopy >30 WBC, many bacteria, no epis   Pertinent Imaging: Ultrasound images personally reviewed and interpreted  10/30/2021 RENAL  Narrative CLINICAL DATA:  Pregnant, flank pain.  EXAM: RENAL / URINARY TRACT ULTRASOUND COMPLETE  COMPARISON:  None Available.  FINDINGS: Right Kidney:  Renal measurements: 9.8 x 6.1 x 6.1 cm = volume: 190 mL. Renal cortical echogenicity is normal and cortical thickness is preserved. There is mild right hydronephrosis. No intrarenal masses or calcifications are seen. No perinephric fluid collections are present.  Left  Kidney:  Renal measurements: 10.7 x 4.7 x 4.3 cm = volume: 114 mL. Echogenicity within normal limits. No mass or hydronephrosis visualized.  Bladder:  Largely decompressed  Other:  None.  IMPRESSION: Mild right hydronephrosis.   Electronically Signed By: Helyn Numbers M.D. On: 04/13/2022 21:35   Assessment & Plan:   26 y.o. female G1 at 24 weeks with recent right flank pain, lower urinary tract symptoms and  hematuria RUS shows no urinary tract calculi.  Mild hydronephrosis which may be physiologic.  We discussed the possibility of a ureteral calculus though small ureteral calculi are typically not visualized on ultrasound and would not recommend CT in the second trimester UA today with significant pyuria not previously seen.  She has mild dysuria Urine culture was ordered and started cefuroxime 250 mg twice daily x7 days.  Instructed to hold the nitrofurantoin and she may resume once the cefuroxime is completed PA follow-up 2 weeks for symptom recheck, repeat UA.  Repeat RUS if still having symptoms   Riki Altes, MD  [facility] [address], [address] [place], [place] [address] [phone]

## 2022-04-18 ENCOUNTER — Encounter: Payer: Self-pay | Admitting: Urology

## 2022-04-20 LAB — CULTURE, URINE COMPREHENSIVE

## 2022-04-22 ENCOUNTER — Telehealth: Payer: Self-pay

## 2022-04-22 NOTE — Telephone Encounter (Signed)
Mychart notification sent 

## 2022-04-22 NOTE — Telephone Encounter (Signed)
-----   Message from Riki Altes, MD sent at 04/22/2022 10:44 AM EDT ----- Urine culture was negative for infection

## 2022-05-01 ENCOUNTER — Ambulatory Visit (INDEPENDENT_AMBULATORY_CARE_PROVIDER_SITE_OTHER): Payer: Medicaid Other | Admitting: Physician Assistant

## 2022-05-01 ENCOUNTER — Encounter: Payer: Self-pay | Admitting: Physician Assistant

## 2022-05-01 VITALS — BP 99/65 | HR 80 | Ht <= 58 in | Wt 134.0 lb

## 2022-05-01 DIAGNOSIS — R109 Unspecified abdominal pain: Secondary | ICD-10-CM | POA: Diagnosis not present

## 2022-05-01 DIAGNOSIS — R8271 Bacteriuria: Secondary | ICD-10-CM

## 2022-05-01 DIAGNOSIS — N1330 Unspecified hydronephrosis: Secondary | ICD-10-CM

## 2022-05-01 DIAGNOSIS — Z3A26 26 weeks gestation of pregnancy: Secondary | ICD-10-CM

## 2022-05-01 DIAGNOSIS — O99891 Other specified diseases and conditions complicating pregnancy: Secondary | ICD-10-CM

## 2022-05-01 DIAGNOSIS — R8281 Pyuria: Secondary | ICD-10-CM | POA: Diagnosis not present

## 2022-05-01 LAB — URINALYSIS, COMPLETE
Bilirubin, UA: NEGATIVE
Glucose, UA: NEGATIVE
Ketones, UA: NEGATIVE
Nitrite, UA: NEGATIVE
RBC, UA: NEGATIVE
Specific Gravity, UA: 1.02 (ref 1.005–1.030)
Urobilinogen, Ur: 0.2 mg/dL (ref 0.2–1.0)
pH, UA: 8.5 — ABNORMAL HIGH (ref 5.0–7.5)

## 2022-05-01 LAB — MICROSCOPIC EXAMINATION

## 2022-05-01 NOTE — Progress Notes (Signed)
05/01/2022 5:07 PM   Lewayne Bunting 1996-08-06 [id number]  CC: Chief Complaint  Patient presents with   Hematuria    2 wk follow up   HPI: Naila Elizondo is a 26 y.o. pregnant female at [redacted]w[redacted]d with recent history of right flank pain and hematuria who presents today for symptom recheck.  Today she reports that her right flank pain completely resolved last week after she passed some small pieces of white debris in her urine.  She has no acute concerns today.    In-office UA today positive for trace protein and 1+ leukocyte esterase; urine microscopy with 11-30 WBCs/HPF and any bacteria.   PMH: Past Medical History:  Diagnosis Date   Asthma     Surgical History: Past Surgical History:  Procedure Laterality Date   NO PAST SURGERIES      Home Medications:  Allergies as of 05/01/2022   No Known Allergies      Medication List        Accurate as of May 01, 2022  5:07 PM. If you have any questions, ask your nurse or doctor.          STOP taking these medications    cefUROXime 250 MG tablet Commonly known as: CEFTIN Stopped by: Carman Ching, PA-C   nitrofurantoin (macrocrystal-monohydrate) 100 MG capsule Commonly known as: MACROBID Stopped by: Carman Ching, PA-C   ondansetron 4 MG disintegrating tablet Commonly known as: ZOFRAN-ODT Stopped by: Carman Ching, PA-C   oxyCODONE-acetaminophen 5-325 MG tablet Commonly known as: PERCOCET/ROXICET Stopped by: Carman Ching, PA-C       TAKE these medications    albuterol 108 (90 Base) MCG/ACT inhaler Commonly known as: VENTOLIN HFA Inhale 2 puffs into the lungs every 6 (six) hours as needed for wheezing or shortness of breath.   PRENATAL PO Take by mouth.        Allergies:  No Known Allergies  Family History: Family History  Problem Relation Age of Onset   Thyroid disease Mother    Breast cancer Neg Hx    Colon cancer Neg Hx     Social History:    reports that she has never smoked. She has never been exposed to tobacco smoke. She has never used smokeless tobacco. She reports that she does not drink alcohol and does not use drugs.  Physical Exam: BP 99/65   Pulse 80   Ht 4\' 10"  (1.473 m)   Wt 134 lb (60.8 kg)   LMP 10/28/2021 (Exact Date)   BMI 28.01 kg/m   Constitutional:  Alert and oriented, no acute distress, nontoxic appearing HEENT: [place], AT Cardiovascular: No clubbing, cyanosis, or edema Respiratory: Normal respiratory effort, no increased work of breathing Skin: No rashes, bruises or suspicious lesions Neurologic: Grossly intact, no focal deficits, moving all 4 extremities Psychiatric: Normal mood and affect  Laboratory Data: Results for orders placed or performed in visit on 05/01/22  Microscopic Examination   Urine  Result Value Ref Range   WBC, UA 11-30 (A) 0 - 5 /hpf   RBC, Urine 0-2 0 - 2 /hpf   Epithelial Cells (non renal) 0-10 0 - 10 /hpf   Bacteria, UA Many (A) None seen/Few  Urinalysis, Complete  Result Value Ref Range   Specific Gravity, UA 1.020 1.005 - 1.030   pH, UA 8.5 (H) 5.0 - 7.5   Color, UA Yellow Yellow   Appearance Ur Clear Clear   Leukocytes,UA 1+ (A) Negative   Protein,UA Trace (A) Negative/Trace   Glucose, UA  Negative Negative   Ketones, UA Negative Negative   RBC, UA Negative Negative   Bilirubin, UA Negative Negative   Urobilinogen, Ur 0.2 0.2 - 1.0 mg/dL   Nitrite, UA Negative Negative   Microscopic Examination See below:    Assessment & Plan:   1. Right flank pain Resolved and patient has passed white debris in her urine, however she was unable to capture these for analysis.  Her UA today is notable for pyuria and bacteriuria similar to prior.  We will send for culture for further evaluation and treat per results.  We discussed repeating a renal ultrasound to reassess her right hydronephrosis and she is in agreement with this plan. - Urinalysis, Complete - CULTURE, URINE  COMPREHENSIVE - US RENAL; Future  Return for Will call with results.  Carman Ching, PA-C  [facility] [address] [place], [place] [address] [phone]

## 2022-05-03 ENCOUNTER — Encounter: Payer: Self-pay | Admitting: Obstetrics and Gynecology

## 2022-05-03 ENCOUNTER — Ambulatory Visit (INDEPENDENT_AMBULATORY_CARE_PROVIDER_SITE_OTHER): Payer: Medicaid Other | Admitting: Obstetrics and Gynecology

## 2022-05-03 VITALS — BP 91/61 | HR 111 | Wt 134.9 lb

## 2022-05-03 DIAGNOSIS — R8281 Pyuria: Secondary | ICD-10-CM

## 2022-05-03 DIAGNOSIS — Z3A26 26 weeks gestation of pregnancy: Secondary | ICD-10-CM

## 2022-05-03 DIAGNOSIS — O26899 Other specified pregnancy related conditions, unspecified trimester: Secondary | ICD-10-CM

## 2022-05-03 DIAGNOSIS — Z6791 Unspecified blood type, Rh negative: Secondary | ICD-10-CM

## 2022-05-03 DIAGNOSIS — Z23 Encounter for immunization: Secondary | ICD-10-CM

## 2022-05-03 DIAGNOSIS — R319 Hematuria, unspecified: Secondary | ICD-10-CM

## 2022-05-03 LAB — POCT URINALYSIS DIPSTICK OB
Bilirubin, UA: NEGATIVE
Blood, UA: NEGATIVE
Glucose, UA: NEGATIVE
Ketones, UA: NEGATIVE
Leukocytes, UA: NEGATIVE
Nitrite, UA: NEGATIVE
Spec Grav, UA: 1.01 (ref 1.010–1.025)
Urobilinogen, UA: 0.2 E.U./dL
pH, UA: 7 (ref 5.0–8.0)

## 2022-05-03 NOTE — Progress Notes (Signed)
ROB: She is doing well. She said she had two episodes were she felt her heartbeat slow down. She has concerns about that.

## 2022-05-03 NOTE — Progress Notes (Signed)
ROB: Patient notes she is concerned as she had 2 episodes of her heartbeat slowing down (noticed mostly after lying down). Discussed cardiac physiologic changes in pregnancy. Today patient with mildly elevated heart rate, though repeat during exam was normal, mild systolic murmur present (typical in pregnancy).   Has been seen by Urologist for recurrent episodes of flank pain that lead to ER visits, pyuria noted, no renal stones but mild hydronephrosis present on initial imaging. Patient initiated on Cefuroxime x 2 weeks, then was to return to [facility] for suppression. Had f/u again with Urology yesterday who still note pyruria, patient scheduled for another [place] next week.   Patient reports desires for female providers. Notes a remote h/o inappopriate touch and has sometimes felt uncomfortable with having a female provider (although she feels current female provider is great). Discussed with patient current practice mole, and that she would be managed by female providers and the midwives are on first call. Can arrange to be seen by midwives in clinic as well as with me.  RTC in 3 weeks, for 1 hour glucola and Rhogam. Repeat UDS next visit for h/o MJ use.  Tdap given today.

## 2022-05-04 LAB — CULTURE, URINE COMPREHENSIVE

## 2022-05-08 ENCOUNTER — Telehealth: Payer: Medicaid Other | Admitting: Nurse Practitioner

## 2022-05-08 ENCOUNTER — Ambulatory Visit: Payer: Medicaid Other

## 2022-05-08 ENCOUNTER — Encounter: Payer: Self-pay | Admitting: Obstetrics and Gynecology

## 2022-05-08 DIAGNOSIS — L559 Sunburn, unspecified: Secondary | ICD-10-CM

## 2022-05-08 NOTE — Progress Notes (Signed)
E-Visit for Sunburn  We are sorry that you are not feeling well.  Here is how we plan to help!  Based on what you have shared with me, I'd like to share with you a treatment plan for sunburn.   Most sunburn is a first degree burn that turns the skin pink or red.  It can be painful to touch.  If you stayed in the sun for a prolonged period this might have progressed to a second degree burn with blistering!  Usually the pain and swelling starts after about 4 hours, peaks at 24 hours and begins to improve after 48 hours or about 2 days.  REMEMBER prolonged exposure to the sun increases your risk of skin cancer so use sunscreen before you go outside!  We will give you more information about sunscreen use later in your care plan.  Your sunburn can be managed by self-care at home.  Please use the following care guide to manage your sunburn.  If you symptoms worsen, you have other questions or concerns, or you develop any of the warnings signs listed in your care plan you will need to seek a face to face visit with a provider without waiting!  Home Care Advice for Treating Mild Sunburn:  You should not use ibuprofen since you are pregnant. You may use tylenol as long as your OBGYN or any other physician has advised you against using acetaminophen products in the past.   Do not take Acetaminophen if you have liver disease. Read the package warnings on any medication that you take!  2.  Steroid creams are also not advised during pregnancy avoid hydrocortisone creams for burns, we would not recommend using lidocaine either at this time.   3. You can use Aloe creams or ointments that you can purchase over the counter.   4.  Apply cool compresses to the burned areas several times a day.  5.  Avoid soap on the sunburned areas.  6.  Drink plenty of water.  It is easy to get dehydrated from prolong time in the sun      Outdoors.  7.  For any broken blisters: Trim off the dead skin with fine  scissors.  It is wise to clean the scissor with alcohol before use. Apply antibiotic ointments to the blister.  Apply twice a day for three days.  There are triple antibiotic ointments with topical pain relievers available at stores. Caution:leave intact blisters alone.  They are protecting the skin and will allow it to heal.  8.  Taking Vitamin C orally may reduce sun damage to your skin.  Follow the      Instructions on the bottle.  The recommended adult dosage is 2 grams.  What to Expect:  Pain usually stops after 2 or 3 days. Skin flaking and peeling usually occurs for 3-7 days after a sunburn.  Call your provider if:  You feel very weak or have difficulty standing. Blister develops on your face. You become sensitive to light because of eye pain. Your skin looks infected (red streaks, puss or worsening tenderness after 48 hours. You feel you should be seen.  Preventing Sunburns:  Apply 20-30 SPF sunscreen to your skin before going into the sun. Reapply every 2-4 hours or after sweating or swimming. Sunscreens protect from sunburns but do not completely prevent skin damage.  Wynelle Link exposure still increases your risk of premature aging and skin cancers.  Thank you for choosing an e-visit.  Your e-visit answers were  reviewed by a board certified advanced clinical practitioner to complete your personal care plan. Depending upon the condition, your plan could have included both over the counter or prescription medications.  Please review your pharmacy choice. Make sure the pharmacy is open so you can pick up prescription now. If there is a problem, you may contact your provider through [employer] and have the prescription routed to another pharmacy.  Your safety is important to [place]. If you have drug allergies check your prescription carefully.   For the next 24 hours you can use MyChart to ask questions about today's visit, request a non-urgent call back, or ask for a work or school  excuse. You will get an email in the next two days asking about your experience. I hope that your e-visit has been valuable and will speed your recovery.  I spent approximately 7 minutes reviewing the patient's history, current symptoms and coordinating their plan of care today.

## 2022-05-13 ENCOUNTER — Other Ambulatory Visit: Payer: Medicaid Other

## 2022-05-14 ENCOUNTER — Ambulatory Visit
Admission: RE | Admit: 2022-05-14 | Discharge: 2022-05-14 | Disposition: A | Discharge status: Home / Self Care | Payer: Medicaid Other | Source: Ambulatory Visit | Attending: Physician Assistant | Admitting: Physician Assistant

## 2022-05-14 DIAGNOSIS — R109 Unspecified abdominal pain: Secondary | ICD-10-CM | POA: Diagnosis not present

## 2022-05-23 ENCOUNTER — Encounter: Payer: Self-pay | Admitting: Obstetrics

## 2022-05-23 ENCOUNTER — Ambulatory Visit (INDEPENDENT_AMBULATORY_CARE_PROVIDER_SITE_OTHER): Payer: Medicaid Other | Admitting: Obstetrics

## 2022-05-23 VITALS — BP 108/72 | HR 96 | Wt 137.4 lb

## 2022-05-23 DIAGNOSIS — O360130 Maternal care for anti-D [Rh] antibodies, third trimester, not applicable or unspecified: Secondary | ICD-10-CM

## 2022-05-23 DIAGNOSIS — Z3A29 29 weeks gestation of pregnancy: Secondary | ICD-10-CM

## 2022-05-23 DIAGNOSIS — Z6791 Unspecified blood type, Rh negative: Secondary | ICD-10-CM

## 2022-05-23 MED ORDER — RHO D IMMUNE GLOBULIN 1500 UNIT/2ML IJ SOSY
300.0000 ug | PREFILLED_SYRINGE | Freq: Once | INTRAMUSCULAR | Status: AC
  Administered 2022-05-23: 300 ug via INTRAMUSCULAR

## 2022-05-23 NOTE — Progress Notes (Signed)
ROB at [redacted]w[redacted]d. Taeler had a near-syncopal event after receiving her Rhogam shot. She recovered quickly. Active baby. Denies LOF, vaginal bleeding, and ctx. Her kidney is feeling much better. She states that her f/u US was normal. Having some muscle cramps; discussed comfort measures. TDaP today. Discussed making a birth plan.  Plans NFP for contraception. Unable to do 1-hour glucose today. Will make lab-only appt. ROB in 2 weeks.  [name], CNM

## 2022-06-04 ENCOUNTER — Encounter: Payer: Medicaid Other | Admitting: Certified Nurse Midwife

## 2022-06-05 ENCOUNTER — Other Ambulatory Visit: Payer: Medicaid Other

## 2022-06-05 ENCOUNTER — Ambulatory Visit (INDEPENDENT_AMBULATORY_CARE_PROVIDER_SITE_OTHER): Payer: Medicaid Other | Admitting: Certified Nurse Midwife

## 2022-06-05 VITALS — BP 100/66 | HR 85 | Wt 137.0 lb

## 2022-06-05 DIAGNOSIS — O26899 Other specified pregnancy related conditions, unspecified trimester: Secondary | ICD-10-CM

## 2022-06-05 DIAGNOSIS — Z3A31 31 weeks gestation of pregnancy: Secondary | ICD-10-CM

## 2022-06-05 DIAGNOSIS — Z3A26 26 weeks gestation of pregnancy: Secondary | ICD-10-CM

## 2022-06-05 NOTE — Progress Notes (Signed)
ROB doing well, feeling good movement. Pt completing glucose testing today. Denies any concerns today. Follow up 2 wks with Missy .   Doreene Burke, CNM

## 2022-06-10 LAB — CBC
Hematocrit: 31.9 % — ABNORMAL LOW (ref 34.0–46.6)
Hemoglobin: 10.4 g/dL — ABNORMAL LOW (ref 11.1–15.9)
MCH: 27.4 pg (ref 26.6–33.0)
MCHC: 32.6 g/dL (ref 31.5–35.7)
MCV: 84 fL (ref 79–97)
Platelets: 250 10*3/uL (ref 150–450)
RBC: 3.8 x10E6/uL (ref 3.77–5.28)
RDW: 11.8 % (ref 11.7–15.4)
WBC: 7.9 10*3/uL (ref 3.4–10.8)

## 2022-06-10 LAB — AB SCR+ANTIBODY ID: Antibody Screen: POSITIVE — AB

## 2022-06-10 LAB — GLUCOSE, 1 HOUR GESTATIONAL: Gestational Diabetes Screen: 120 mg/dL (ref 70–139)

## 2022-06-10 LAB — ANTIBODY SCREEN

## 2022-06-10 LAB — RPR: RPR Ser Ql: NONREACTIVE

## 2022-06-13 IMAGING — US US OB < 14 WEEKS - US OB TV
1 series · 15 of 28 positions shown · non-contrast
Comparison: Ultrasound 05/21/2015

CLINICAL DATA: Left lower quadrant pain

EXAM:
OBSTETRIC <14 WK US AND TRANSVAGINAL OB US
TECHNIQUE: Both transabdominal and transvaginal ultrasound examinations were
performed for complete evaluation of the gestation as well as the
maternal uterus, adnexal regions, and pelvic cul-de-sac.
Transvaginal technique was performed to assess early pregnancy.

[Series 1: us ob comp less 14 wks · 47 acquisitions, 15 frames shown]
[im 1/47]
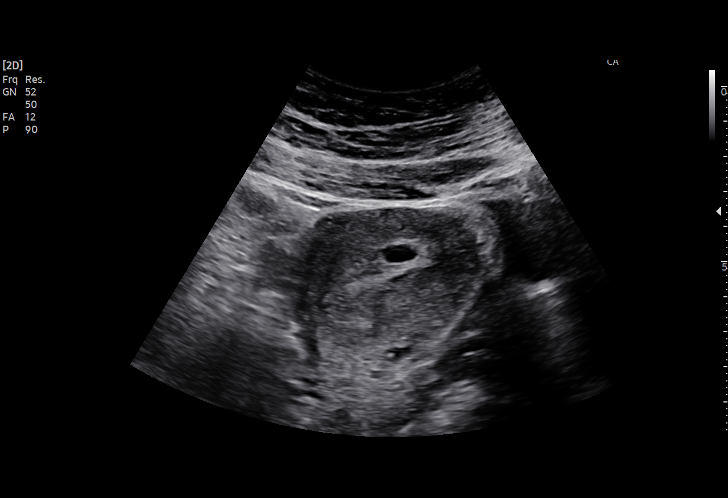
[im 4/47]
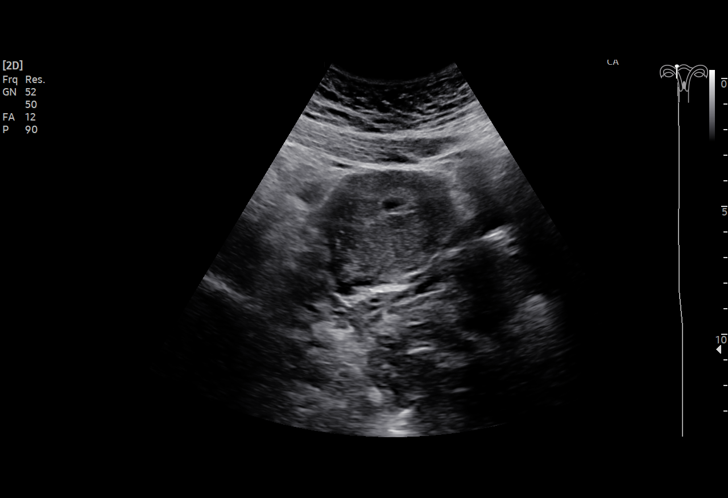
[im 7/47]
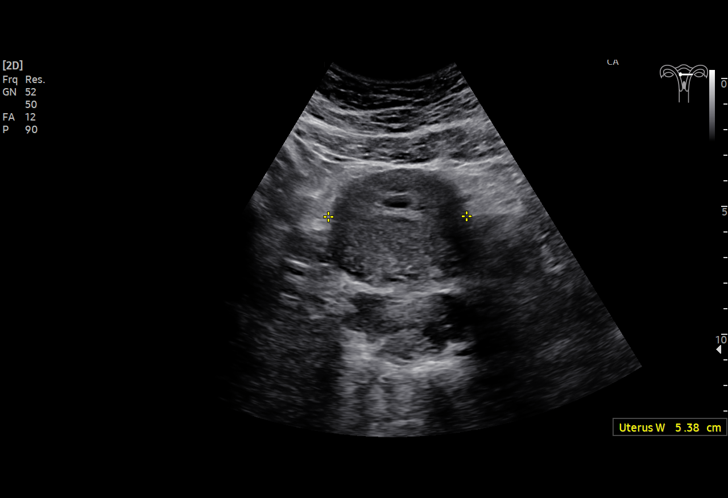
[im 11/47]
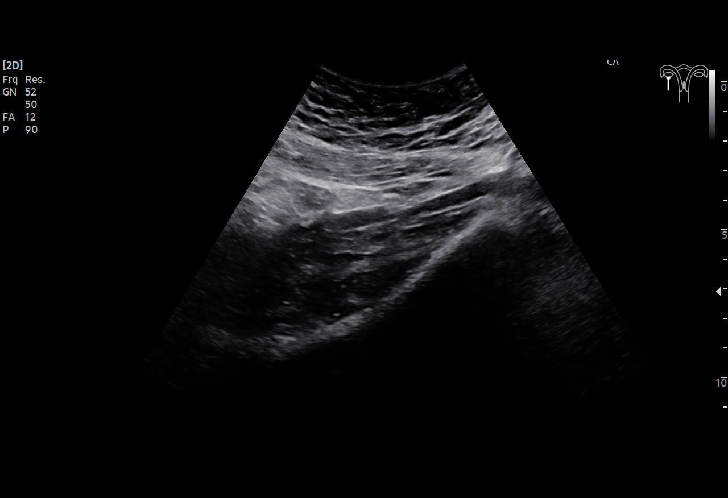
[im 14/47]
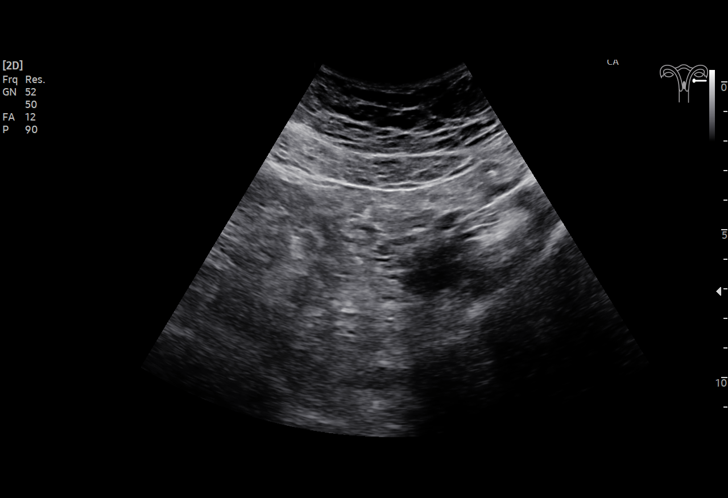
[im 18/47]
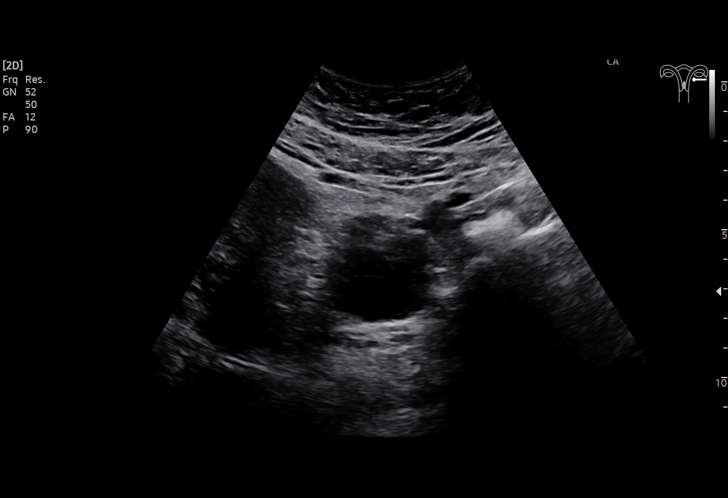
[im 21/47]
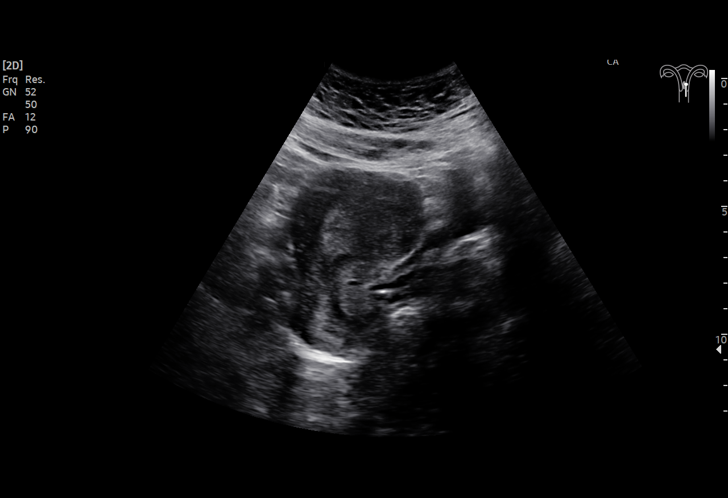
[im 24/47]
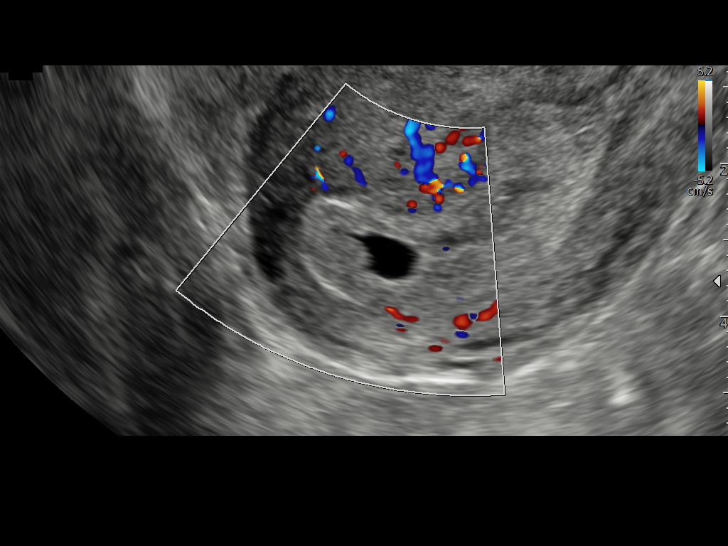
[im 26/47]
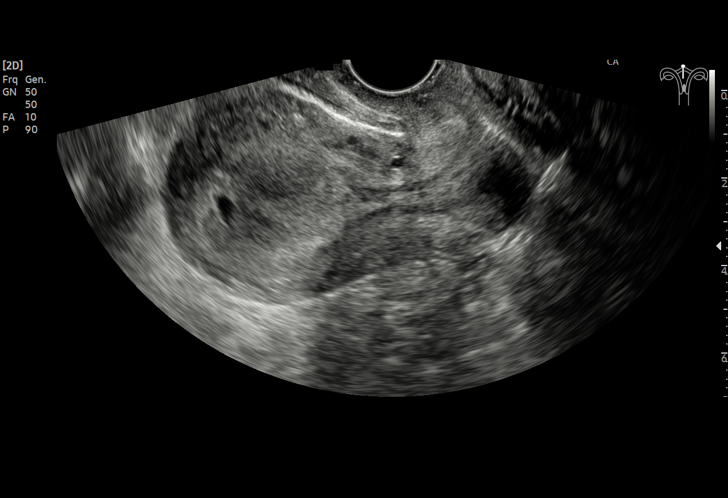
[im 29/47]
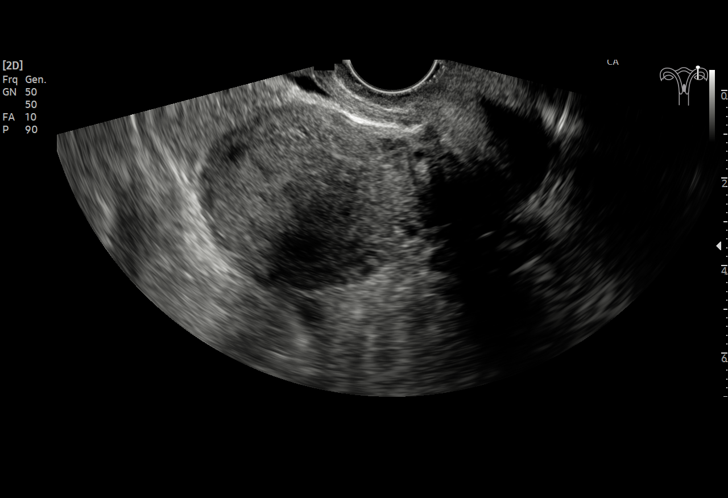
[im 33/47]
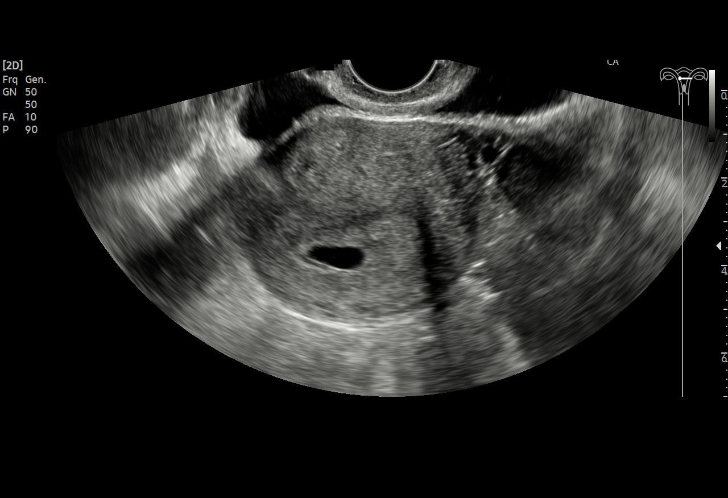
[im 36/47]
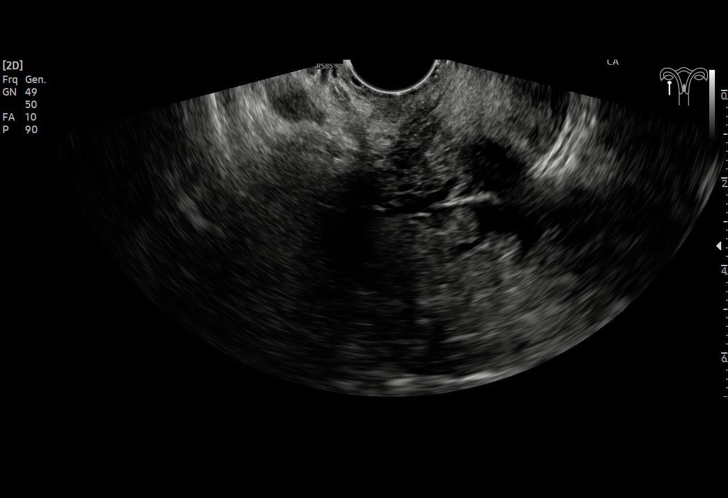
[im 40/47]
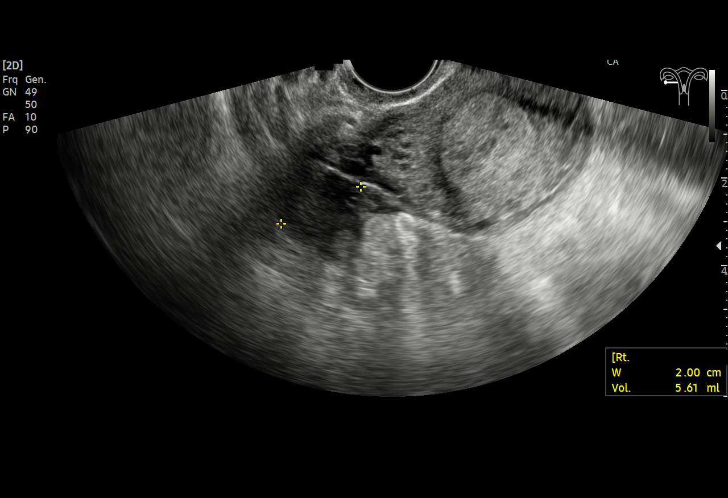
[im 43/47]
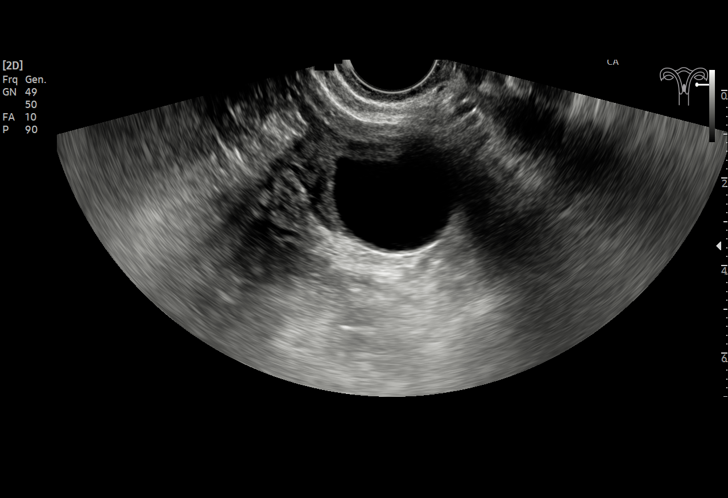
[im 47/47]
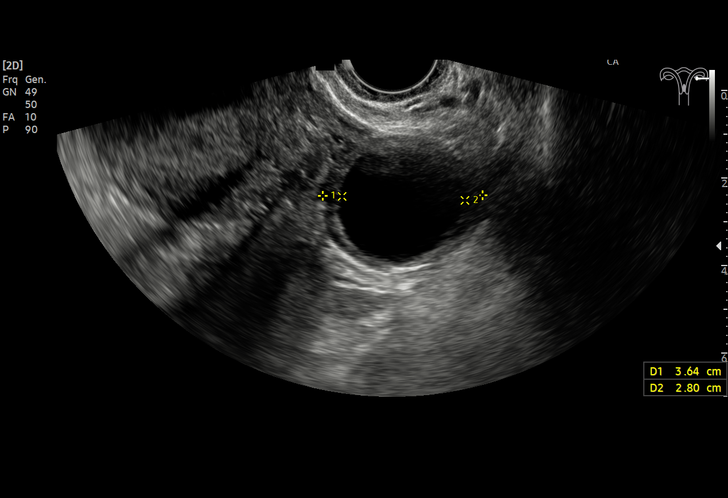

[15 of 28 positions shown; findings below may reference images not displayed]

FINDINGS: Intrauterine gestational sac: Single

Yolk sac:  Not Visualized.

Embryo:  Not Visualized.

Cardiac Activity: Not Visualized.

Heart Rate: Not applicable

MSD: 9.6 mm   5 w   5 d

CRL:  Not applicable

Subchorionic hemorrhage:  None visualized.

Maternal uterus/adnexae: Normal right ovary. Corpus luteal cyst in
the left ovary measuring 2.9 cm. Normal color Doppler flow.
IMPRESSION: Probable early intrauterine gestational sac, but no yolk sac, fetal
pole, or cardiac activity yet visualized. Recommend follow-up
quantitative B-HCG levels and follow-up US in 14 days to assess
viability. This recommendation follows SRU consensus guidelines:
Diagnostic Criteria for Nonviable Pregnancy Early in the First
Trimester. N Engl J Med 0888; [DATE].

## 2022-06-18 ENCOUNTER — Ambulatory Visit (INDEPENDENT_AMBULATORY_CARE_PROVIDER_SITE_OTHER): Payer: Medicaid Other | Admitting: Obstetrics and Gynecology

## 2022-06-18 ENCOUNTER — Encounter: Payer: Self-pay | Admitting: Obstetrics and Gynecology

## 2022-06-18 VITALS — BP 95/69 | HR 75 | Wt 143.5 lb

## 2022-06-18 DIAGNOSIS — Z3A33 33 weeks gestation of pregnancy: Secondary | ICD-10-CM

## 2022-06-18 DIAGNOSIS — Z3403 Encounter for supervision of normal first pregnancy, third trimester: Secondary | ICD-10-CM

## 2022-06-18 DIAGNOSIS — G43101 Migraine with aura, not intractable, with status migrainosus: Secondary | ICD-10-CM

## 2022-06-18 HISTORY — DX: Encounter for supervision of normal first pregnancy, third trimester: Z34.03

## 2022-06-18 LAB — POCT URINALYSIS DIPSTICK OB
Bilirubin, UA: NEGATIVE
Glucose, UA: NEGATIVE
Ketones, UA: NEGATIVE
Leukocytes, UA: NEGATIVE
Nitrite, UA: NEGATIVE
POC,PROTEIN,UA: NEGATIVE
Spec Grav, UA: 1.01 (ref 1.010–1.025)
Urobilinogen, UA: 0.2 E.U./dL
pH, UA: 7.5 (ref 5.0–8.0)

## 2022-06-18 NOTE — Progress Notes (Signed)
ROB: On Saturday she loss her vision for about 1 minute, it came back but vision was very blurred. She also had some mental fog, she couldn't read a sentence after she vision came back and she also got a severe headache after with sensitivity to light and noises. She felt really lightheaded afterwards as well.

## 2022-06-18 NOTE — Progress Notes (Signed)
ROB: Reports over the weekend had vision loss for ~ 1 minute, then returned but was blurry. Tried to research her symptoms but notes that she couldn't comprehend what she was reading. Had her boyfriend read it to her but had difficulty responding back to him.  Also notes she had a severe headache shortly after with sensitivity to light and noise. Reports headache lasted for almost 12 hours, other symptoms lasted for ~ 30 minutes. Did take Tylenol which gave mild relief. Does have a h/o headaches in the past but discussed that this may have been first migraine with neurologic symptoms (aura). Will refer to Neurology. RTC in 2 weeks. To discuss circumcision and Pediatrician plans next visit.

## 2022-06-19 NOTE — Progress Notes (Unsigned)
NEUROLOGY CONSULTATION NOTE  Lori Stevenson MRN: [id number] DOB: 11/16/1995  Referring provider: Hildred Laser, MD Primary care provider: [facility]  Reason for consult:  migraine  Assessment/Plan:   Migraine with aura, without status migrainosus, not intractable Pregnant at *** weeks gestation  Migraine prevention:  *** Migraine rescue:  *** Limit use of pain relievers to no more than 2 days out of week to prevent risk of rebound or medication-overuse headache. Keep headache diary Follow up ***    Subjective:  Lori Stevenson is a 26 year old female with asthma and pregnant at *** weeks gestation who presents for migraine.  History supplemented by referring provider's note.  She is currently pregnant at *** weeks gestation.  Over this past weekend, she had episode of sudden vision loss lasting for approximately one minute which returned but continued to have blurred vision for ***.  Had difficulty reading and speaking.  This lasted about 30 minutes.  She then developed a severe *** headache associated with photophobia and phonophobia lasting approximately 12 hours.  Treated with Tylenol with ***.    Reports history of headaches ***  Past NSAIDS/analgesics:  *** Past abortive triptans:  *** Past abortive ergotamine:  *** Past muscle relaxants:  *** Past anti-emetic:  *** Past antihypertensive medications:  *** Past antidepressant medications:  *** Past anticonvulsant medications:  *** Past anti-CGRP:  *** Past vitamins/Herbal/Supplements:  *** Past antihistamines/decongestants:  *** Other past therapies:  ***  Current NSAIDS/analgesics:  *** Current triptans:  *** Current ergotamine:  *** Current anti-emetic:  *** Current muscle relaxants:  *** Current Antihypertensive medications:  *** Current Antidepressant medications:  *** Current Anticonvulsant medications:  *** Current anti-CGRP:  *** Current Vitamins/Herbal/Supplements:   *** Current Antihistamines/Decongestants:  *** Other therapy:  *** Hormone/birth control:  *** Other medications:  ***   Caffeine:  *** Alcohol:  *** Smoker:  *** Diet:  *** Exercise:  *** Depression:  ***; Anxiety:  *** Other pain:  *** Sleep hygiene:  *** Family history of headache:  ***      PAST MEDICAL HISTORY: Past Medical History:  Diagnosis Date   Asthma     PAST SURGICAL HISTORY: Past Surgical History:  Procedure Laterality Date   NO PAST SURGERIES      MEDICATIONS: Current Outpatient Medications on File Prior to Visit  Medication Sig Dispense Refill   albuterol (VENTOLIN HFA) 108 (90 Base) MCG/ACT inhaler Inhale 2 puffs into the lungs every 6 (six) hours as needed for wheezing or shortness of breath. 1 each 2   Prenatal Vit-Fe Fumarate-FA (PRENATAL PO) Take by mouth.     [DISCONTINUED] omeprazole (PRILOSEC OTC) 20 MG tablet Take 1 tablet (20 mg total) by mouth daily. 28 tablet 1   [DISCONTINUED] promethazine (PHENERGAN) 25 MG tablet Take 25 mg by mouth every 6 (six) hours as needed for nausea or vomiting.     [DISCONTINUED] sucralfate (CARAFATE) 1 g tablet Take 1 tablet (1 g total) by mouth 4 (four) times daily as needed (for abdominal discomfort, nausea, and/or vomiting). 30 tablet 1   No current facility-administered medications on file prior to visit.    ALLERGIES: No Known Allergies  FAMILY HISTORY: Family History  Problem Relation Age of Onset   Thyroid disease Mother    Breast cancer Neg Hx    Colon cancer Neg Hx     Objective:  *** General: No acute distress.  Patient appears well-groomed.   Head:  Normocephalic/atraumatic Eyes:  fundi examined but not  visualized Neck: supple, no paraspinal tenderness, full range of motion Back: No paraspinal tenderness Heart: regular rate and rhythm Lungs: Clear to auscultation bilaterally. Vascular: No carotid bruits. Neurological Exam: Mental status: alert and oriented to person, place, and time,  speech fluent and not dysarthric, language intact. Cranial nerves: CN I: not tested CN II: pupils equal, round and reactive to light, visual fields intact CN III, IV, VI:  full range of motion, no nystagmus, no ptosis CN V: facial sensation intact. CN VII: upper and lower face symmetric CN VIII: hearing intact CN IX, X: gag intact, uvula midline CN XI: sternocleidomastoid and trapezius muscles intact CN XII: tongue midline Bulk & Tone: normal, no fasciculations. Motor:  muscle strength 5/5 throughout Sensation:  Pinprick, temperature and vibratory sensation intact. Deep Tendon Reflexes:  2+ throughout,  toes downgoing.   Finger to nose testing:  Without dysmetria.   Heel to shin:  Without dysmetria.   Gait:  Normal station and stride.  Romberg negative.    Thank you for allowing me to take part in the care of this patient.  Shon Millet, DO  CC: ***

## 2022-06-20 ENCOUNTER — Encounter: Payer: Self-pay | Admitting: Neurology

## 2022-06-20 ENCOUNTER — Ambulatory Visit: Payer: Medicaid Other | Admitting: Neurology

## 2022-06-20 VITALS — BP 111/72 | HR 74 | Resp 20 | Ht <= 58 in | Wt 143.0 lb

## 2022-06-20 DIAGNOSIS — G43109 Migraine with aura, not intractable, without status migrainosus: Secondary | ICD-10-CM | POA: Diagnosis not present

## 2022-06-20 DIAGNOSIS — O26899 Other specified pregnancy related conditions, unspecified trimester: Secondary | ICD-10-CM | POA: Diagnosis not present

## 2022-06-20 DIAGNOSIS — R519 Headache, unspecified: Secondary | ICD-10-CM

## 2022-06-20 DIAGNOSIS — H538 Other visual disturbances: Secondary | ICD-10-CM

## 2022-06-20 DIAGNOSIS — Z3A33 33 weeks gestation of pregnancy: Secondary | ICD-10-CM | POA: Diagnosis not present

## 2022-06-20 NOTE — Patient Instructions (Signed)
I think you had a migraine.  But because you still endorse some blurred vision, I want to rule out potential blood clot in brain.  For headache, you may take Tylenol.  Follow up in 4 months (or sooner if needed)

## 2022-06-23 ENCOUNTER — Observation Stay
Admission: EM | Admit: 2022-06-23 | Discharge: 2022-06-23 | Disposition: A | Discharge status: Home / Self Care | Payer: Medicaid Other | Attending: Advanced Practice Midwife | Admitting: Advanced Practice Midwife

## 2022-06-23 ENCOUNTER — Other Ambulatory Visit: Payer: Self-pay

## 2022-06-23 ENCOUNTER — Encounter: Payer: Self-pay | Admitting: Obstetrics and Gynecology

## 2022-06-23 DIAGNOSIS — R000 Tachycardia, unspecified: Secondary | ICD-10-CM | POA: Insufficient documentation

## 2022-06-23 DIAGNOSIS — O99513 Diseases of the respiratory system complicating pregnancy, third trimester: Secondary | ICD-10-CM | POA: Insufficient documentation

## 2022-06-23 DIAGNOSIS — Z3A34 34 weeks gestation of pregnancy: Secondary | ICD-10-CM

## 2022-06-23 DIAGNOSIS — J45909 Unspecified asthma, uncomplicated: Secondary | ICD-10-CM | POA: Insufficient documentation

## 2022-06-23 DIAGNOSIS — O99891 Other specified diseases and conditions complicating pregnancy: Principal | ICD-10-CM | POA: Insufficient documentation

## 2022-06-23 DIAGNOSIS — O26813 Pregnancy related exhaustion and fatigue, third trimester: Secondary | ICD-10-CM | POA: Insufficient documentation

## 2022-06-23 DIAGNOSIS — R319 Hematuria, unspecified: Secondary | ICD-10-CM

## 2022-06-23 NOTE — OB Triage Note (Signed)
Discharge instructions reviewed and all questions answered with pt verbalizing understanding. Pt stable at the time of discharge home with significant other and follow-up care with [facility] reviewed. Red flag labor precautions discussed.

## 2022-06-23 NOTE — OB Triage Note (Signed)
Pt is a G1P0 and [redacted]w[redacted]d presenting to L&D after calling the nursing line with complaint of "elevated heart rate." Pt states Friday she was laying down and felt like her "heart was racing." Pt states she looked at her apple watch which read a heart rate of 122. Pt states that she had a recurring episode on Saturday. This morning, she was standing and doing the dishes and she stated her heart started racing again. She reports feeling "weak and tired" after the episodes and "like I am going to pass out." Pt also reports "current stressful situations with family." Pt denies headache, epigastric pain and reports positive fetal movement. Pt denies vaginal bleeding, contractions and leaking of fluid. Monitors applied and assessing, VSS.

## 2022-06-23 NOTE — Discharge Summary (Signed)
Physician Final Progress Note  Patient ID: Lori Stevenson MRN: [id number] DOB/AGE: [birth year] 26 y.o.  Admit date: 06/23/2022 Admitting provider: Tresea Mall, CNM Discharge date: 06/23/2022   Admission Diagnoses:  1) intrauterine pregnancy at [redacted]w[redacted]d  2) episodes of maternal elevated heart rate  Discharge Diagnoses:  Principal Problem:   Labor and delivery, indication for care Active Problems:   [redacted] weeks gestation of pregnancy   History of Present Illness: The patient is a 26 y.o. female G1P0 at [redacted]w[redacted]d who presents for recent episodes of maternal tachycardia. She reports several episodes since Friday lasting about 20 minutes at a time. She denies previous episodes of the same. She also experiences a feeling of weakness and tiredness like she may pass out after the episode. She denies losing consciousness or falling. Her usual heart rate is 80 with elevation to 120s during the episodes. She reports good fetal movement.She thinks fetal movement increases when her heart rate is elevated. She denies vaginal bleeding, leakage of fluid or contractions. She declines cardiology referral at this time and is advised to follow up with prenatal care provider as needed for recurring or worsening episodes. She had a neurology consult in the past week for an episode of loss of vision followed by a severe headache. She also reports increased social stressors at this time and thinks that may be an aggravating factor.  Monitoring is reassuring and she is discharged to home with instructions and precautions.   Past Medical History:  Diagnosis Date   Asthma     Past Surgical History:  Procedure Laterality Date   NO PAST SURGERIES      No current facility-administered medications on file prior to encounter.   Current Outpatient Medications on File Prior to Encounter  Medication Sig Dispense Refill   Prenatal Vit-Fe Fumarate-FA (PRENATAL PO) Take by mouth.     albuterol (VENTOLIN HFA) 108 (90 Base)  MCG/ACT inhaler Inhale 2 puffs into the lungs every 6 (six) hours as needed for wheezing or shortness of breath. (Patient not taking: Reported on 06/23/2022) 1 each 2   [DISCONTINUED] omeprazole (PRILOSEC OTC) 20 MG tablet Take 1 tablet (20 mg total) by mouth daily. 28 tablet 1   [DISCONTINUED] promethazine (PHENERGAN) 25 MG tablet Take 25 mg by mouth every 6 (six) hours as needed for nausea or vomiting.     [DISCONTINUED] sucralfate (CARAFATE) 1 g tablet Take 1 tablet (1 g total) by mouth 4 (four) times daily as needed (for abdominal discomfort, nausea, and/or vomiting). 30 tablet 1    No Known Allergies  Social History   Socioeconomic History   Marital status: Significant Other    Spouse name: Ethelene Browns   Number of children: Not on file   Years of education: Not on file   Highest education level: Not on file  Occupational History   Not on file  Tobacco Use   Smoking status: Never    Passive exposure: Never   Smokeless tobacco: Never  Vaping Use   Vaping Use: Never used  Substance and Sexual Activity   Alcohol use: No   Drug use: No   Sexual activity: Yes    Partners: Male    Comment: undecided  Other Topics Concern   Not on file  Social History Narrative   Right handed   One story home   Drinks caffeineq   Social Determinants of Health   Financial Resource Strain: Not on file  Food Insecurity: Not on file  Transportation Needs: Not on file  Physical  Activity: Not on file  Stress: Not on file  Social Connections: Not on file  Intimate Partner Violence: Not on file    Family History  Problem Relation Age of Onset   Thyroid disease Mother    Breast cancer Neg Hx    Colon cancer Neg Hx      Review of Systems  Constitutional:  Negative for chills and fever.       Positive for weakness and tiredness  HENT:  Negative for congestion, ear discharge, ear pain, hearing loss, sinus pain and sore throat.   Eyes:  Negative for blurred vision and double vision.   Respiratory:  Negative for cough, shortness of breath and wheezing.   Cardiovascular:  Negative for chest pain, palpitations and leg swelling.       Positive for elevated heart rate  Gastrointestinal:  Negative for abdominal pain, blood in stool, constipation, diarrhea, heartburn, melena, nausea and vomiting.  Genitourinary:  Negative for dysuria, flank pain, frequency, hematuria and urgency.  Musculoskeletal:  Negative for back pain, joint pain and myalgias.  Skin:  Negative for itching and rash.  Neurological:  Positive for headaches. Negative for dizziness, tingling, tremors, sensory change, speech change, focal weakness, seizures, loss of consciousness and weakness.  Endo/Heme/Allergies:  Negative for environmental allergies. Does not bruise/bleed easily.  Psychiatric/Behavioral:  Negative for depression, hallucinations, memory loss, substance abuse and suicidal ideas. The patient is not nervous/anxious and does not have insomnia.      Physical Exam: BP 112/72 (BP Location: Left Arm)   Pulse 95   Temp 98.4 F (36.9 C) (Oral)   Resp 18   Ht 4\' 10"  (1.473 m)   Wt 64.9 kg   LMP 10/28/2021 (Exact Date)   SpO2 99%   BMI 29.89 kg/m   Constitutional: Well nourished, well developed female in no acute distress.  HEENT: normal Skin: Warm and dry.  Cardiovascular: Regular rate and rhythm.   Extremity:  no edema   Respiratory: Clear to auscultation bilateral. Normal respiratory effort Abdomen: FHT present Back: no CVAT Neuro: DTRs 2+, Cranial nerves grossly intact Psych: Alert and Oriented x3. No memory deficits. Normal mood and affect.   Toco: some irritability Fetal well being: reactive tracing: 130 bpm, moderate variability, +accelerations, -decelerations  Consults: None  Significant Findings/ Diagnostic Studies: none  Procedures: NST  Hospital Course: The patient was admitted to Labor and Delivery Triage for observation.   Discharge Condition: good  Disposition:  Discharge disposition: 01-Home or Self Care  Diet: Regular diet  Discharge Activity: Activity as tolerated  Discharge Instructions     Discharge activity:  No Restrictions   Complete by: As directed    Discharge diet:  No restrictions   Complete by: As directed       Allergies as of 06/23/2022   No Known Allergies      Medication List     STOP taking these medications    albuterol 108 (90 Base) MCG/ACT inhaler Commonly known as: VENTOLIN HFA       TAKE these medications    PRENATAL PO Take by mouth.        Follow-up Information     06/25/2022, MD. Go to.   Specialties: Obstetrics and Gynecology, Radiology Why: If symptoms worsen, As needed Contact information: [address] [place] [facility] [place] [phone]                 Total time spent taking care of this patient: 22 minutes  Signed:  Tresea Mall, CNM  06/23/2022, 5:19 PM

## 2022-07-04 ENCOUNTER — Inpatient Hospital Stay: Admission: RE | Admit: 2022-07-04 | Payer: Medicaid Other | Source: Ambulatory Visit

## 2022-07-10 ENCOUNTER — Encounter: Payer: Self-pay | Admitting: Certified Nurse Midwife

## 2022-07-10 ENCOUNTER — Ambulatory Visit (INDEPENDENT_AMBULATORY_CARE_PROVIDER_SITE_OTHER): Payer: Medicaid Other | Admitting: Certified Nurse Midwife

## 2022-07-10 ENCOUNTER — Other Ambulatory Visit (HOSPITAL_COMMUNITY)
Admission: RE | Admit: 2022-07-10 | Discharge: 2022-07-10 | Disposition: A | Discharge status: Home / Self Care | Payer: Medicaid Other | Source: Ambulatory Visit | Attending: Certified Nurse Midwife | Admitting: Certified Nurse Midwife

## 2022-07-10 VITALS — BP 101/66 | HR 80 | Wt 147.5 lb

## 2022-07-10 DIAGNOSIS — Z3A36 36 weeks gestation of pregnancy: Secondary | ICD-10-CM

## 2022-07-10 DIAGNOSIS — Z3403 Encounter for supervision of normal first pregnancy, third trimester: Secondary | ICD-10-CM | POA: Insufficient documentation

## 2022-07-10 DIAGNOSIS — Z0283 Encounter for blood-alcohol and blood-drug test: Secondary | ICD-10-CM

## 2022-07-10 NOTE — Progress Notes (Signed)
GBS and cultures collected today. Discussed labor precautions. She is feeling good movement . Repeat drug screen today , had positive screen at [facility]. Labor precautions reviewed. Follow up 1 wk for ROB.   Doreene Burke, CNM

## 2022-07-10 NOTE — Patient Instructions (Signed)
Braxton Hicks Contractions  Contractions of the uterus can occur throughout pregnancy, but they are not always a sign that you are in labor. You may have practice contractions called Braxton Hicks contractions. These false labor contractions are sometimes confused with true labor. What are Braxton Hicks contractions? Braxton Hicks contractions are tightening movements that occur in the muscles of the uterus before labor. Unlike true labor contractions, these contractions do not result in opening (dilation) and thinning of the lowest part of the uterus (cervix). Toward the end of pregnancy (32-34 weeks), Braxton Hicks contractions can happen more often and may become stronger. These contractions are sometimes difficult to tell apart from true labor because they can be very uncomfortable. How to tell the difference between true labor and false labor True labor Contractions last 30-70 seconds. Contractions become very regular. Discomfort is usually felt in the top of the uterus, and it spreads to the lower abdomen and low back. Contractions do not go away with walking. Contractions usually become stronger and more frequent. The cervix dilates and gets thinner. False labor Contractions are usually shorter, weaker, and farther apart than true labor contractions. Contractions are usually irregular. Contractions are often felt in the front of the lower abdomen and in the groin. Contractions may go away when you walk around or change positions while lying down. The cervix usually does not dilate or become thin. Sometimes, the only way to tell if you are in true labor is for your health care provider to look for changes in your cervix. Your health care provider will do a physical exam and may monitor your contractions. If you are in true labor, your health care provider will send you home with instructions about when to return to the hospital. You may continue to have Braxton Hicks contractions until you  go into true labor. Follow these instructions at home:  Take over-the-counter and prescription medicines only as told by your health care provider. If Braxton Hicks contractions are making you uncomfortable: Change your position from lying down or resting to walking, or change from walking to resting. Sit and rest in a tub of warm water. Drink enough fluid to keep your urine pale yellow. Dehydration may cause these contractions. Do slow and deep breathing several times an hour. Keep all follow-up visits. This is important. Contact a health care provider if: You have a fever. You have continuous pain in your abdomen. Your contractions become stronger, more regular, and closer together. You pass blood-tinged mucus. Get help right away if: You have fluid leaking or gushing from your vagina. You have bright red blood coming from your vagina. Your baby is not moving inside you as much as it used to. Summary You may have practice contractions called Braxton Hicks contractions. These false labor contractions are sometimes confused with true labor. Braxton Hicks contractions are usually shorter, weaker, farther apart, and less regular than true labor contractions. True labor contractions usually become stronger, more regular, and more frequent. Manage discomfort from Braxton Hicks contractions by changing position, resting in a warm bath, practicing deep breathing, and drinking plenty of water. Keep all follow-up visits. Contact your health care provider if your contractions become stronger, more regular, and closer together. This information is not intended to replace advice given to you by your health care provider. Make sure you discuss any questions you have with your health care provider. Document Revised: 08/28/2020 Document Reviewed: 08/28/2020 Elsevier Patient Education  2023 [employer]  

## 2022-07-12 LAB — MONITOR DRUG PROFILE 14(MW)
Amphetamine Scrn, Ur: NEGATIVE ng/mL
BARBITURATE SCREEN URINE: NEGATIVE ng/mL
BENZODIAZEPINE SCREEN, URINE: NEGATIVE ng/mL
Buprenorphine, Urine: NEGATIVE ng/mL
CANNABINOIDS UR QL SCN: NEGATIVE ng/mL
Cocaine (Metab) Scrn, Ur: NEGATIVE ng/mL
Creatinine(Crt), U: 113.7 mg/dL (ref 20.0–300.0)
Fentanyl, Urine: NEGATIVE pg/mL
Meperidine Screen, Urine: NEGATIVE ng/mL
Methadone Screen, Urine: NEGATIVE ng/mL
OXYCODONE+OXYMORPHONE UR QL SCN: NEGATIVE ng/mL
Opiate Scrn, Ur: NEGATIVE ng/mL
Ph of Urine: 6.1 (ref 4.5–8.9)
Phencyclidine Qn, Ur: NEGATIVE ng/mL
Propoxyphene Scrn, Ur: NEGATIVE ng/mL
SPECIFIC GRAVITY: 1.018
Tramadol Screen, Urine: NEGATIVE ng/mL

## 2022-07-12 LAB — STREP GP B NAA: Strep Gp B NAA: NEGATIVE

## 2022-07-15 LAB — GC/CHLAMYDIA PROBE AMP (~~LOC~~) NOT AT ARMC
Chlamydia: NEGATIVE
Comment: NEGATIVE
Comment: NORMAL
Neisseria Gonorrhea: NEGATIVE

## 2022-07-19 ENCOUNTER — Ambulatory Visit (INDEPENDENT_AMBULATORY_CARE_PROVIDER_SITE_OTHER): Payer: Medicaid Other | Admitting: Obstetrics

## 2022-07-19 ENCOUNTER — Encounter: Payer: Self-pay | Admitting: Obstetrics

## 2022-07-19 VITALS — BP 114/75 | HR 69 | Wt 149.4 lb

## 2022-07-19 DIAGNOSIS — Z3403 Encounter for supervision of normal first pregnancy, third trimester: Secondary | ICD-10-CM

## 2022-07-19 DIAGNOSIS — Z3A37 37 weeks gestation of pregnancy: Secondary | ICD-10-CM

## 2022-07-19 LAB — POCT URINALYSIS DIPSTICK OB
Bilirubin, UA: NEGATIVE
Blood, UA: NEGATIVE
Glucose, UA: NEGATIVE
Ketones, UA: NEGATIVE
Leukocytes, UA: NEGATIVE
Nitrite, UA: NEGATIVE
POC,PROTEIN,UA: NEGATIVE
Spec Grav, UA: 1.01 (ref 1.010–1.025)
Urobilinogen, UA: 0.2 E.U./dL
pH, UA: 7.5 (ref 5.0–8.0)

## 2022-07-19 NOTE — Progress Notes (Addendum)
ROB at [redacted]w[redacted]d. Active baby. Lori Stevenson is having occasional contractions and thinks she lost her mucus plug. Denies LOF and vaginal bleeding. Discussed s/s of labor, when to go to the hospital. She is concerned about the size of her baby and her weight gain. Reassured about normal growth and weight gain. Declines SVE. RTC in one week.  [name], CNM

## 2022-07-24 ENCOUNTER — Ambulatory Visit (INDEPENDENT_AMBULATORY_CARE_PROVIDER_SITE_OTHER): Payer: Medicaid Other | Admitting: Certified Nurse Midwife

## 2022-07-24 VITALS — BP 114/76 | HR 73 | Wt 149.9 lb

## 2022-07-24 DIAGNOSIS — Z3A38 38 weeks gestation of pregnancy: Secondary | ICD-10-CM

## 2022-07-24 NOTE — Progress Notes (Signed)
ROB doing well, feeling good movement. Labor precautions reviewed. SVE per pt request 3/70/-2. Follow up 1 wk for ROB.   Philip Aspen, CNM

## 2022-07-24 NOTE — Patient Instructions (Signed)
Braxton Hicks Contractions  Contractions of the uterus can occur throughout pregnancy, but they are not always a sign that you are in labor. You may have practice contractions called Braxton Hicks contractions. These false labor contractions are sometimes confused with true labor. What are Braxton Hicks contractions? Braxton Hicks contractions are tightening movements that occur in the muscles of the uterus before labor. Unlike true labor contractions, these contractions do not result in opening (dilation) and thinning of the lowest part of the uterus (cervix). Toward the end of pregnancy (32-34 weeks), Braxton Hicks contractions can happen more often and may become stronger. These contractions are sometimes difficult to tell apart from true labor because they can be very uncomfortable. How to tell the difference between true labor and false labor True labor Contractions last 30-70 seconds. Contractions become very regular. Discomfort is usually felt in the top of the uterus, and it spreads to the lower abdomen and low back. Contractions do not go away with walking. Contractions usually become stronger and more frequent. The cervix dilates and gets thinner. False labor Contractions are usually shorter, weaker, and farther apart than true labor contractions. Contractions are usually irregular. Contractions are often felt in the front of the lower abdomen and in the groin. Contractions may go away when you walk around or change positions while lying down. The cervix usually does not dilate or become thin. Sometimes, the only way to tell if you are in true labor is for your health care provider to look for changes in your cervix. Your health care provider will do a physical exam and may monitor your contractions. If you are in true labor, your health care provider will send you home with instructions about when to return to the hospital. You may continue to have Braxton Hicks contractions until you  go into true labor. Follow these instructions at home:  Take over-the-counter and prescription medicines only as told by your health care provider. If Braxton Hicks contractions are making you uncomfortable: Change your position from lying down or resting to walking, or change from walking to resting. Sit and rest in a tub of warm water. Drink enough fluid to keep your urine pale yellow. Dehydration may cause these contractions. Do slow and deep breathing several times an hour. Keep all follow-up visits. This is important. Contact a health care provider if: You have a fever. You have continuous pain in your abdomen. Your contractions become stronger, more regular, and closer together. You pass blood-tinged mucus. Get help right away if: You have fluid leaking or gushing from your vagina. You have bright red blood coming from your vagina. Your baby is not moving inside you as much as it used to. Summary You may have practice contractions called Braxton Hicks contractions. These false labor contractions are sometimes confused with true labor. Braxton Hicks contractions are usually shorter, weaker, farther apart, and less regular than true labor contractions. True labor contractions usually become stronger, more regular, and more frequent. Manage discomfort from Braxton Hicks contractions by changing position, resting in a warm bath, practicing deep breathing, and drinking plenty of water. Keep all follow-up visits. Contact your health care provider if your contractions become stronger, more regular, and closer together. This information is not intended to replace advice given to you by your health care provider. Make sure you discuss any questions you have with your health care provider. Document Revised: 08/28/2020 Document Reviewed: 08/28/2020 Elsevier Patient Education  2023 [employer]  

## 2022-07-25 ENCOUNTER — Inpatient Hospital Stay: Payer: Medicaid Other | Admitting: Anesthesiology

## 2022-07-25 ENCOUNTER — Other Ambulatory Visit: Payer: Self-pay

## 2022-07-25 ENCOUNTER — Encounter: Payer: Self-pay | Admitting: Obstetrics and Gynecology

## 2022-07-25 ENCOUNTER — Inpatient Hospital Stay
Admission: EM | Admit: 2022-07-25 | Discharge: 2022-07-27 | DRG: 805 | Disposition: A | Discharge status: Home / Self Care | Payer: Medicaid Other | Attending: Certified Nurse Midwife | Admitting: Certified Nurse Midwife

## 2022-07-25 DIAGNOSIS — O99324 Drug use complicating childbirth: Secondary | ICD-10-CM | POA: Diagnosis not present

## 2022-07-25 DIAGNOSIS — F1210 Cannabis abuse, uncomplicated: Secondary | ICD-10-CM | POA: Diagnosis not present

## 2022-07-25 DIAGNOSIS — O26893 Other specified pregnancy related conditions, third trimester: Secondary | ICD-10-CM | POA: Diagnosis present

## 2022-07-25 DIAGNOSIS — O99892 Other specified diseases and conditions complicating childbirth: Secondary | ICD-10-CM | POA: Diagnosis present

## 2022-07-25 DIAGNOSIS — F1290 Cannabis use, unspecified, uncomplicated: Secondary | ICD-10-CM | POA: Diagnosis present

## 2022-07-25 DIAGNOSIS — Z6791 Unspecified blood type, Rh negative: Secondary | ICD-10-CM

## 2022-07-25 DIAGNOSIS — Z3A38 38 weeks gestation of pregnancy: Secondary | ICD-10-CM | POA: Diagnosis not present

## 2022-07-25 DIAGNOSIS — I9589 Other hypotension: Secondary | ICD-10-CM | POA: Diagnosis present

## 2022-07-25 DIAGNOSIS — J45909 Unspecified asthma, uncomplicated: Secondary | ICD-10-CM | POA: Diagnosis not present

## 2022-07-25 DIAGNOSIS — R319 Hematuria, unspecified: Principal | ICD-10-CM

## 2022-07-25 DIAGNOSIS — O411230 Chorioamnionitis, third trimester, not applicable or unspecified: Secondary | ICD-10-CM | POA: Diagnosis not present

## 2022-07-25 DIAGNOSIS — O99513 Diseases of the respiratory system complicating pregnancy, third trimester: Secondary | ICD-10-CM | POA: Diagnosis not present

## 2022-07-25 DIAGNOSIS — Z3A34 34 weeks gestation of pregnancy: Secondary | ICD-10-CM

## 2022-07-25 LAB — CBC
HCT: 33.1 % — ABNORMAL LOW (ref 36.0–46.0)
Hemoglobin: 10.7 g/dL — ABNORMAL LOW (ref 12.0–15.0)
MCH: 25.5 pg — ABNORMAL LOW (ref 26.0–34.0)
MCHC: 32.3 g/dL (ref 30.0–36.0)
MCV: 79 fL — ABNORMAL LOW (ref 80.0–100.0)
Platelets: 239 10*3/uL (ref 150–400)
RBC: 4.19 MIL/uL (ref 3.87–5.11)
RDW: 13.6 % (ref 11.5–15.5)
WBC: 11.8 10*3/uL — ABNORMAL HIGH (ref 4.0–10.5)
nRBC: 0 % (ref 0.0–0.2)

## 2022-07-25 LAB — TYPE AND SCREEN
ABO/RH(D): O NEG
Antibody Screen: POSITIVE

## 2022-07-25 LAB — ABO/RH: ABO/RH(D): O NEG

## 2022-07-25 MED ORDER — OXYTOCIN 10 UNIT/ML IJ SOLN
INTRAMUSCULAR | Status: AC
  Filled 2022-07-25: qty 2

## 2022-07-25 MED ORDER — LIDOCAINE HCL (PF) 1 % IJ SOLN
30.0000 mL | INTRAMUSCULAR | Status: AC | PRN
  Administered 2022-07-26: 30 mL via SUBCUTANEOUS
  Filled 2022-07-25: qty 30

## 2022-07-25 MED ORDER — LIDOCAINE-EPINEPHRINE (PF) 1.5 %-1:200000 IJ SOLN
INTRAMUSCULAR | Status: DC | PRN
  Administered 2022-07-25: 3 mL via EPIDURAL

## 2022-07-25 MED ORDER — MISOPROSTOL 200 MCG PO TABS
ORAL_TABLET | ORAL | Status: AC
  Filled 2022-07-25: qty 4

## 2022-07-25 MED ORDER — PHENYLEPHRINE 80 MCG/ML (10ML) SYRINGE FOR IV PUSH (FOR BLOOD PRESSURE SUPPORT): 80.0000 ug | PREFILLED_SYRINGE | INTRAVENOUS | Status: DC | PRN

## 2022-07-25 MED ORDER — EPHEDRINE 5 MG/ML INJ
10.0000 mg | INTRAVENOUS | Status: AC | PRN
  Administered 2022-07-25 (×2): 10 mg via INTRAVENOUS

## 2022-07-25 MED ORDER — EPHEDRINE 5 MG/ML INJ
10.0000 mg | INTRAVENOUS | Status: DC | PRN
  Filled 2022-07-25 (×2): qty 5

## 2022-07-25 MED ORDER — ONDANSETRON HCL 4 MG/2ML IJ SOLN
4.0000 mg | Freq: Four times a day (QID) | INTRAMUSCULAR | Status: DC | PRN
  Filled 2022-07-25: qty 2

## 2022-07-25 MED ORDER — TERBUTALINE SULFATE 1 MG/ML IJ SOLN: 0.2500 mg | Freq: Once | INTRAMUSCULAR | Status: DC | PRN

## 2022-07-25 MED ORDER — FENTANYL-BUPIVACAINE-NACL 0.5-0.125-0.9 MG/250ML-% EP SOLN
EPIDURAL | Status: AC
  Filled 2022-07-25: qty 250

## 2022-07-25 MED ORDER — DIPHENHYDRAMINE HCL 50 MG/ML IJ SOLN: 12.5000 mg | INTRAMUSCULAR | Status: DC | PRN

## 2022-07-25 MED ORDER — OXYTOCIN-SODIUM CHLORIDE 30-0.9 UT/500ML-% IV SOLN
1.0000 m[IU]/min | INTRAVENOUS | Status: DC
  Administered 2022-07-25: 2 m[IU]/min via INTRAVENOUS

## 2022-07-25 MED ORDER — FENTANYL CITRATE (PF) 100 MCG/2ML IJ SOLN: 50.0000 ug | INTRAMUSCULAR | Status: DC | PRN

## 2022-07-25 MED ORDER — FENTANYL-BUPIVACAINE-NACL 0.5-0.125-0.9 MG/250ML-% EP SOLN
12.0000 mL/h | EPIDURAL | Status: DC | PRN
  Administered 2022-07-25: 12 mL/h via EPIDURAL

## 2022-07-25 MED ORDER — ACETAMINOPHEN 325 MG PO TABS
650.0000 mg | ORAL_TABLET | ORAL | Status: DC | PRN
  Administered 2022-07-26: 650 mg via ORAL
  Filled 2022-07-25 (×2): qty 2

## 2022-07-25 MED ORDER — OXYTOCIN-SODIUM CHLORIDE 30-0.9 UT/500ML-% IV SOLN
2.5000 [IU]/h | INTRAVENOUS | Status: DC
  Administered 2022-07-26: 2.5 [IU]/h via INTRAVENOUS
  Filled 2022-07-25: qty 500

## 2022-07-25 MED ORDER — LACTATED RINGERS IV SOLN
500.0000 mL | INTRAVENOUS | Status: DC | PRN
  Administered 2022-07-25 (×2): 500 mL via INTRAVENOUS

## 2022-07-25 MED ORDER — AMMONIA AROMATIC IN INHA
RESPIRATORY_TRACT | Status: AC
  Filled 2022-07-25: qty 10

## 2022-07-25 MED ORDER — LIDOCAINE HCL (PF) 1 % IJ SOLN
INTRAMUSCULAR | Status: DC | PRN
  Administered 2022-07-25: 3 mL

## 2022-07-25 MED ORDER — BUPIVACAINE HCL (PF) 0.25 % IJ SOLN
INTRAMUSCULAR | Status: DC | PRN
  Administered 2022-07-25 (×2): 3 mL via EPIDURAL

## 2022-07-25 MED ORDER — OXYTOCIN BOLUS FROM INFUSION
333.0000 mL | Freq: Once | INTRAVENOUS | Status: AC
  Administered 2022-07-26: 333 mL via INTRAVENOUS

## 2022-07-25 MED ORDER — LACTATED RINGERS IV SOLN
INTRAVENOUS | Status: DC
Start: 2022-07-25 — End: 2022-07-28

## 2022-07-25 MED ORDER — LACTATED RINGERS IV SOLN
500.0000 mL | Freq: Once | INTRAVENOUS | Status: AC
  Administered 2022-07-25: 500 mL via INTRAVENOUS

## 2022-07-25 NOTE — Progress Notes (Signed)
LABOR NOTE   SUBJECTIVE:   Lori Stevenson is a 26 y.o.  G1P0  at [redacted]w[redacted]d who presented in active labor. She has made minimal cervical change since her last exam but fetal station has changed from 0 to +1. She is comfortable with her epidural She has been feeling some occasional rectal pressure. Lori Stevenson is in agreement with starting Pitocin at this time.  Analgesia: Epidural  OBJECTIVE:  BP 99/74   Pulse 88   Temp 98.4 F (36.9 C) (Oral)   Resp (!) 21   Ht 4\' 10"  (1.473 m)   Wt 67.6 kg   LMP 10/28/2021 (Exact Date)   SpO2 97%   BMI 31.14 kg/m  No intake/output data recorded.  SVE:   Dilation: 8 Effacement (%): 90 Station: Plus 1 Exam by:: Suhaas Agena CNM CONTRACTIONS: regular, every 2-4 minutes FHR: Fetal heart tracing reviewed. Baseline: 145 Variability: moderate Accelerations: present Decelerations: isolated late decel 40 min ago Category 1  Labs: Lab Results  Component Value Date   WBC 11.8 (H) 07/25/2022   HGB 10.7 (L) 07/25/2022   HCT 33.1 (L) 07/25/2022   MCV 79.0 (L) 07/25/2022   PLT 239 07/25/2022    ASSESSMENT: 1) Augmentation of labor, progressing well      Reassuring maternal/fetal status     Coping: well. Resting with epidural     Membranes: ruptured, clear fluid      Principal Problem:   Labor and delivery, indication for care   PLAN: Continue Pitocin augmentation Anticipate NSVD Dr. Amalia Hailey notified  Lloyd Huger, CNM 07/25/2022 9:19 PM

## 2022-07-25 NOTE — OB Triage Note (Signed)
Patient [redacted]w[redacted]d coming in for contractions tonight after being seen by [facility] on 9/20. They did a cervical exam and the pt was 3cm per exam. Patiet having some spotting after exam and is not contracting every 5-7 minutes. Having back and lower abd pain.

## 2022-07-25 NOTE — H&P (Signed)
OB History & Physical   History of Present Illness:  Chief Complaint: contractions  HPI:  Lori Stevenson is a 26 y.o. G1P0 female at [redacted]w[redacted]d dated by LMP.  Her pregnancy has been complicated by positive marijuana, hx of asthma, Rh negative status, hydronephrosis- macrobid suppression .    She reports contractions that started at midnight.   She denies leakage of fluid.   She denies vaginal bleeding.   She reports fetal movement.    Total weight gain for pregnancy: 11.8 kg   Obstetrical Problem List: G1 Problems (from 01/07/22 to present)     Problem Noted Resolved   [redacted] weeks gestation of pregnancy 06/23/2022 by Rod Can, CNM No   Hematuria of unknown cause 04/07/2022 by Imagene Riches, CNM No   Overview Signed 04/07/2022  2:39 PM by Imagene Riches, CNM    04/07/2022 seen in triage . No definite UTI per [facility]. Urine sent for culture. No vaginal bleeding, nor hemmorhoid. Told to f/u  At [facility].           Maternal Medical History:   Past Medical History:  Diagnosis Date   Asthma     Past Surgical History:  Procedure Laterality Date   NO PAST SURGERIES      No Known Allergies  Prior to Admission medications   Medication Sig Start Date End Date Taking? Authorizing Provider  Prenatal Vit-Fe Fumarate-FA (PRENATAL PO) Take by mouth.   Yes [provider]  omeprazole (PRILOSEC OTC) 20 MG tablet Take 1 tablet (20 mg total) by mouth daily. 02/13/17 12/22/19  Hinda Kehr, MD  promethazine (PHENERGAN) 25 MG tablet Take 25 mg by mouth every 6 (six) hours as needed for nausea or vomiting.  12/22/19  [provider]  sucralfate (CARAFATE) 1 g tablet Take 1 tablet (1 g total) by mouth 4 (four) times daily as needed (for abdominal discomfort, nausea, and/or vomiting). 02/13/17 12/22/19  Hinda Kehr, MD    OB History  Gravida Para Term Preterm AB Living  1            SAB IAB Ectopic Multiple Live Births               # Outcome Date GA Lbr Len/2nd Weight Sex  Delivery Anes PTL Lv  1 Current             Prenatal care site: [facility]  Social History: She  reports that she has never smoked. She has never been exposed to tobacco smoke. She has never used smokeless tobacco. She reports that she does not drink alcohol and does not use drugs.  Family History: family history includes Thyroid disease in her mother.    Review of Systems:  Review of Systems  Constitutional:  Negative for chills and fever.  HENT:  Negative for congestion, ear discharge, ear pain, hearing loss, sinus pain and sore throat.   Eyes:  Negative for blurred vision and double vision.  Respiratory:  Negative for cough, shortness of breath and wheezing.   Cardiovascular:  Negative for chest pain, palpitations and leg swelling.  Gastrointestinal:  Positive for abdominal pain. Negative for blood in stool, constipation, diarrhea, heartburn, melena, nausea and vomiting.  Genitourinary:  Negative for dysuria, flank pain, frequency, hematuria and urgency.  Musculoskeletal:  Negative for back pain, joint pain and myalgias.  Skin:  Negative for itching and rash.  Neurological:  Negative for dizziness, tingling, tremors, sensory change, speech change, focal weakness, seizures, loss of consciousness, weakness and  headaches.  Endo/Heme/Allergies:  Negative for environmental allergies. Does not bruise/bleed easily.  Psychiatric/Behavioral:  Negative for depression, hallucinations, memory loss, substance abuse and suicidal ideas. The patient is not nervous/anxious and does not have insomnia.      Physical Exam:  BP 115/78   Pulse 73   Temp 98.6 F (37 C) (Oral)   Resp 19   Ht 4\' 10"  (1.473 m)   Wt 67.6 kg   LMP 10/28/2021 (Exact Date)   SpO2 99%   BMI 31.14 kg/m   Constitutional: Well nourished, well developed female in no acute distress.  HEENT: normal Skin: Warm and dry.  Cardiovascular: Regular rate and rhythm.   Extremity:  no edema   Respiratory: Clear to  auscultation bilateral. Normal respiratory effort Abdomen: FHT present Back: no CVAT Neuro: DTRs 2+, Cranial nerves grossly intact Psych: Alert and Oriented x3. No memory deficits. Normal mood and affect.    Pelvic exam: per RN 5/80/-1   Pertinent Results:   Blood type/Rh O negative  Antibody screen negative  Rubella Immune  Varicella Immune    RPR Non-reactive  HBsAg negative  HIV negative  GC negative  Chlamydia negative  Genetic screening Negative/female  1 hour GTT 120  3 hour GTT NA  GBS negative on 9/6   Baseline FHR: 120 beats/min   Variability: moderate   Accelerations: present   Decelerations: absent Contractions: present frequency: every 2-6 minutes Overall assessment: reassuring   Lab Results  Component Value Date   SARSCOV2NAA POSITIVE (A) 04/28/2021    Assessment:  Lori Stevenson is a 26 y.o. G1P0 female at [redacted]w[redacted]d with active labor.   Plan:  Admit to Labor & Delivery  CBC, T&S, Clrs, IVF GBS negative.   Fetal well-being: Category I Expectant management for vaginal delivery    [redacted]w[redacted]d, [facility] 07/25/2022 7:59 AM

## 2022-07-25 NOTE — Progress Notes (Signed)
LABOR NOTE   SUBJECTIVE:   Lori Stevenson is a 26 y.o.  G1P0  at [redacted]w[redacted]d who is in active labor. Her pain is now well-controlled with epidural without hypotensive side effects. She is making cervical change and coping well.  Analgesia: Epidural  OBJECTIVE:  BP 125/78   Pulse 92   Temp 98.1 F (36.7 C) (Oral)   Resp (!) 22   Ht 4\' 10"  (1.473 m)   Wt 67.6 kg   LMP 10/28/2021 (Exact Date)   SpO2 98%   BMI 31.14 kg/m  No intake/output data recorded.  SVE:   Dilation: 8 Effacement (%): 90 Station: 0 Exam by:: Jaquarius Seder CNM CONTRACTIONS: regular, every 2-4 minutes FHR: Fetal heart tracing reviewed. Baseline: 125 Variability: moderate Accelerations: present Decelerations:none Category 1  Labs: Lab Results  Component Value Date   WBC 11.8 (H) 07/25/2022   HGB 10.7 (L) 07/25/2022   HCT 33.1 (L) 07/25/2022   MCV 79.0 (L) 07/25/2022   PLT 239 07/25/2022    ASSESSMENT: 1) Spontaneous labor, progressing normally     Coping: well. Wants to sleep     Membranes: ruptured, clear fluid  Principal Problem:   Labor and delivery, indication for care   PLAN: Expectant management Encourage rest and frequent position changes Anticipate NSVD Dr. Amalia Hailey udpated  Lloyd Huger, CNM 07/25/2022 5:38 PM

## 2022-07-25 NOTE — Anesthesia Preprocedure Evaluation (Signed)
Anesthesia Evaluation  Patient identified by MRN, date of birth, ID band Patient awake    Reviewed: Allergy & Precautions, H&P , NPO status , Patient's Chart, lab work & pertinent test results  Airway Mallampati: II       Dental no notable dental hx.    Pulmonary asthma ,    Pulmonary exam normal        Cardiovascular negative cardio ROS Normal cardiovascular exam     Neuro/Psych negative neurological ROS  negative psych ROS   GI/Hepatic negative GI ROS, Neg liver ROS,   Endo/Other  negative endocrine ROS  Renal/GU negative Renal ROS  negative genitourinary   Musculoskeletal   Abdominal   Peds  Hematology negative hematology ROS (+)   Anesthesia Other Findings   Reproductive/Obstetrics (+) Pregnancy                             Anesthesia Physical Anesthesia Plan  ASA: 2  Anesthesia Plan:    Post-op Pain Management:    Induction:   PONV Risk Score and Plan:   Airway Management Planned:   Additional Equipment:   Intra-op Plan:   Post-operative Plan:   Informed Consent:   Plan Discussed with: Anesthesiologist and CRNA  Anesthesia Plan Comments:         Anesthesia Quick Evaluation

## 2022-07-25 NOTE — Progress Notes (Signed)
LABOR NOTE   SUBJECTIVE:   Lori Stevenson is a 26 y.o.G1P0 at [redacted]w[redacted]d who is on L&D for active labor. She is comfortable with her epidural, and she has made cervical change. She had an episode of symptomatic hypotension and shortness of breath. Ephedrine was administered and anesthesia was called to the bedside. Sats remained >98% and FHR remained stable throughout.  Analgesia: Epidural  OBJECTIVE:  BP 107/62   Pulse 66   Temp 98.1 F (36.7 C) (Oral)   Resp 19   Ht 4\' 10"  (1.473 m)   Wt 67.6 kg   LMP 10/28/2021 (Exact Date)   SpO2 100%   BMI 31.14 kg/m  No intake/output data recorded.  SVE:   Dilation: 6 Effacement (%): 80 Station: -1 Exam by:: Cherylann Parr, CNM CONTRACTIONS: irregular, every 2-5 minutes FHR: Fetal heart tracing reviewed. Baseline: 125 Variability: moderate Accelerations: present Decelerations:none Category 1  Labs: Lab Results  Component Value Date   WBC 11.8 (H) 07/25/2022   HGB 10.7 (L) 07/25/2022   HCT 33.1 (L) 07/25/2022   MCV 79.0 (L) 07/25/2022   PLT 239 07/25/2022    ASSESSMENT: 1) Spontaneous labor, progressing normally     Coping: well     Membranes: intact     Transient hypotension from epidural      Principal Problem:   Labor and delivery, indication for care   PLAN: Membranes swept Consider augmentation if minimal cervical change over next 2 hours Epidural rate has been decreased per anesthesia Anticipate NSVD   Lloyd Huger, CNM 07/25/2022 10:44 AM

## 2022-07-25 NOTE — Progress Notes (Signed)
LABOR NOTE   SUBJECTIVE:   Lori Stevenson is a 26 y.o.G1P0 at [redacted]w[redacted]d who is in active labor. She has had minimal cervical change since her last exam, but the station is lower. We discussed available augmentation options of AROM or Pitocin, and she would like to proceed with AROM. She is starting to feel contractions again but is coping well.  Analgesia: Epidural (restarting at 2)  OBJECTIVE:  BP 106/66   Pulse 82   Temp 98.4 F (36.9 C) (Oral)   Resp 18   Ht 4\' 10"  (1.473 m)   Wt 67.6 kg   LMP 10/28/2021 (Exact Date)   SpO2 100%   BMI 31.14 kg/m  No intake/output data recorded.  SVE:   Dilation: 6 Effacement (%): 80 Station: 0 Exam by:: Cherylann Parr, CNM CONTRACTIONS: irregular, every 3-6 minutes FHR: Fetal heart tracing reviewed. Baseline: 135 Variability: moderate Accelerations: present Decelerations:none Category 1  Labs: Lab Results  Component Value Date   WBC 11.8 (H) 07/25/2022   HGB 10.7 (L) 07/25/2022   HCT 33.1 (L) 07/25/2022   MCV 79.0 (L) 07/25/2022   PLT 239 07/25/2022    ASSESSMENT: 1)  Spontaneous labor, minimal cervical change over 3 hours     Coping: well     Membranes: AROM for clear fluid   Principal Problem:   Labor and delivery, indication for care   PLAN: Continue present management Monitor closely for epidural level Consider Pitocin augmentation if no cervical change over next several hours Anticipate NSVD MD updated  Lloyd Huger, CNM 07/25/2022 1:56 PM

## 2022-07-25 NOTE — Anesthesia Procedure Notes (Signed)
Epidural Patient location during procedure: OB Start time: 07/25/2022 9:32 AM End time: 07/25/2022 9:43 AM  Staffing Anesthesiologist: Ilene Qua, MD Resident/CRNA: Norm Salt, CRNA Performed: anesthesiologist   Preanesthetic Checklist Completed: patient identified, IV checked, site marked, risks and benefits discussed, surgical consent, monitors and equipment checked, pre-op evaluation and timeout performed  Epidural Patient position: sitting Prep: ChloraPrep Patient monitoring: heart rate, continuous pulse ox and blood pressure Approach: midline Location: L3-L4 Injection technique: LOR saline  Needle:  Needle type: Tuohy  Needle gauge: 17 G Needle length: 9 cm and 9 Needle insertion depth: 6 cm Catheter type: closed end flexible Catheter size: 19 Gauge Catheter at skin depth: 11 cm Test dose: negative and 1.5% lidocaine with Epi 1:200 K  Assessment Events: blood not aspirated, injection not painful, no injection resistance, no paresthesia and negative IV test  Additional Notes 1 attempt Pt. Evaluated and documentation done after procedure finished. Patient identified. Risks/Benefits/Options discussed with patient including but not limited to bleeding, infection, nerve damage, paralysis, failed block, incomplete pain control, headache, blood pressure changes, nausea, vomiting, reactions to medication both or allergic, itching and postpartum back pain. Confirmed with bedside nurse the patient's most recent platelet count. Confirmed with patient that they are not currently taking any anticoagulation, have any bleeding history or any family history of bleeding disorders. Patient expressed understanding and wished to proceed. All questions were answered. Sterile technique was used throughout the entire procedure. Please see nursing notes for vital signs. Test dose was given through epidural catheter and negative prior to continuing to dose epidural or start infusion. Warning  signs of high block given to the patient including shortness of breath, tingling/numbness in hands, complete motor block, or any concerning symptoms with instructions to call for help. Patient was given instructions on fall risk and not to get out of bed. All questions and concerns addressed with instructions to call with any issues or inadequate analgesia.    Patient tolerated the insertion well without immediate complications.Reason for block:procedure for pain

## 2022-07-26 ENCOUNTER — Encounter: Payer: Self-pay | Admitting: Obstetrics and Gynecology

## 2022-07-26 DIAGNOSIS — O411230 Chorioamnionitis, third trimester, not applicable or unspecified: Secondary | ICD-10-CM

## 2022-07-26 DIAGNOSIS — O99513 Diseases of the respiratory system complicating pregnancy, third trimester: Secondary | ICD-10-CM

## 2022-07-26 DIAGNOSIS — J45909 Unspecified asthma, uncomplicated: Secondary | ICD-10-CM

## 2022-07-26 DIAGNOSIS — Z3A38 38 weeks gestation of pregnancy: Secondary | ICD-10-CM

## 2022-07-26 DIAGNOSIS — F1210 Cannabis abuse, uncomplicated: Secondary | ICD-10-CM

## 2022-07-26 DIAGNOSIS — O99324 Drug use complicating childbirth: Secondary | ICD-10-CM

## 2022-07-26 LAB — CBC
HCT: 27 % — ABNORMAL LOW (ref 36.0–46.0)
Hemoglobin: 8.7 g/dL — ABNORMAL LOW (ref 12.0–15.0)
MCH: 25.5 pg — ABNORMAL LOW (ref 26.0–34.0)
MCHC: 32.2 g/dL (ref 30.0–36.0)
MCV: 79.2 fL — ABNORMAL LOW (ref 80.0–100.0)
Platelets: 235 10*3/uL (ref 150–400)
RBC: 3.41 MIL/uL — ABNORMAL LOW (ref 3.87–5.11)
RDW: 14 % (ref 11.5–15.5)
WBC: 20.2 10*3/uL — ABNORMAL HIGH (ref 4.0–10.5)
nRBC: 0 % (ref 0.0–0.2)

## 2022-07-26 LAB — RPR: RPR Ser Ql: NONREACTIVE

## 2022-07-26 MED ORDER — DOCUSATE SODIUM 100 MG PO CAPS
100.0000 mg | ORAL_CAPSULE | Freq: Two times a day (BID) | ORAL | Status: DC
  Administered 2022-07-26 – 2022-07-27 (×2): 100 mg via ORAL
  Filled 2022-07-26 (×2): qty 1

## 2022-07-26 MED ORDER — IBUPROFEN 600 MG PO TABS
600.0000 mg | ORAL_TABLET | Freq: Four times a day (QID) | ORAL | Status: DC
  Administered 2022-07-26 – 2022-07-27 (×6): 600 mg via ORAL
  Filled 2022-07-26 (×6): qty 1

## 2022-07-26 MED ORDER — METHYLERGONOVINE MALEATE 0.2 MG PO TABS: 0.2000 mg | ORAL_TABLET | ORAL | Status: DC | PRN

## 2022-07-26 MED ORDER — ACETAMINOPHEN 325 MG PO TABS
650.0000 mg | ORAL_TABLET | ORAL | Status: DC | PRN
  Administered 2022-07-26: 650 mg via ORAL

## 2022-07-26 MED ORDER — COCONUT OIL OIL: 1.0000 | TOPICAL_OIL | Status: DC | PRN

## 2022-07-26 MED ORDER — SODIUM CHLORIDE 0.9 % IV SOLN: 2.0000 g | Freq: Four times a day (QID) | INTRAVENOUS | Status: DC

## 2022-07-26 MED ORDER — OXYCODONE-ACETAMINOPHEN 5-325 MG PO TABS: 1.0000 | ORAL_TABLET | ORAL | Status: DC | PRN

## 2022-07-26 MED ORDER — METHYLERGONOVINE MALEATE 0.2 MG/ML IJ SOLN
INTRAMUSCULAR | Status: AC
  Filled 2022-07-26: qty 1

## 2022-07-26 MED ORDER — DIPHENHYDRAMINE HCL 25 MG PO CAPS: 25.0000 mg | ORAL_CAPSULE | Freq: Four times a day (QID) | ORAL | Status: DC | PRN

## 2022-07-26 MED ORDER — OXYTOCIN-SODIUM CHLORIDE 30-0.9 UT/500ML-% IV SOLN: 2.5000 [IU]/h | INTRAVENOUS | Status: DC | PRN

## 2022-07-26 MED ORDER — BENZOCAINE-MENTHOL 20-0.5 % EX AERO
1.0000 | INHALATION_SPRAY | CUTANEOUS | Status: DC | PRN
  Administered 2022-07-26: 1 via TOPICAL
  Filled 2022-07-26 (×2): qty 56

## 2022-07-26 MED ORDER — PRENATAL MULTIVITAMIN CH
1.0000 | ORAL_TABLET | Freq: Every day | ORAL | Status: DC
  Administered 2022-07-26 – 2022-07-27 (×2): 1 via ORAL
  Filled 2022-07-26 (×2): qty 1

## 2022-07-26 MED ORDER — SIMETHICONE 80 MG PO CHEW: 80.0000 mg | CHEWABLE_TABLET | ORAL | Status: DC | PRN

## 2022-07-26 MED ORDER — SODIUM CHLORIDE 0.9 % IV SOLN
INTRAVENOUS | Status: AC
  Administered 2022-07-26: 2 g via INTRAVENOUS
  Filled 2022-07-26: qty 2000

## 2022-07-26 MED ORDER — METHYLERGONOVINE MALEATE 0.2 MG/ML IJ SOLN: 0.2000 mg | INTRAMUSCULAR | Status: DC | PRN

## 2022-07-26 MED ORDER — GENTAMICIN SULFATE 40 MG/ML IJ SOLN
5.0000 mg/kg | INTRAVENOUS | Status: DC
  Administered 2022-07-26: 340 mg via INTRAVENOUS
  Filled 2022-07-26: qty 8.5

## 2022-07-26 NOTE — Progress Notes (Signed)
Progress Note Began pushing at approximately 0130. Lori Stevenson is pushing with excellent effort. Persistent Category 2 tracing. Consulted with Dr. Amalia Hailey to review strip, consider potential need for intervention.  Cherylann Parr, CNM

## 2022-07-26 NOTE — Lactation Note (Addendum)
This note was copied from a baby's chart. Lactation Consultation Note  Patient Name: Lori Stevenson Date: 07/26/2022 Reason for consult: Initial assessment;Primapara;Early term 37-38.6wks Age:25 hours  Maternal Data Has patient been taught Hand Expression?: Yes Does the patient have breastfeeding experience prior to this delivery?: No  Feeding Mother's Current Feeding Choice: Breast Milk Assisted  mom with waking baby to nurse, then hand expression, able to express drops of colostrum from both breasts, baby latches easily in cradle hold to left  breast and nurses well with stimulation and audible swallows noted , encouraged mom to offer right breast when baby comes off left, call for assist as needed.  LATCH Score Latch: Grasps breast easily, tongue down, lips flanged, rhythmical sucking.  Audible Swallowing: Spontaneous and intermittent  Type of Nipple: Everted at rest and after stimulation  Comfort (Breast/Nipple): Soft / non-tender  Hold (Positioning): Assistance needed to correctly position infant at breast and maintain latch.  LATCH Score: 9   Lactation Tools Discussed/Used   [place] name and no written on white board Interventions Interventions: Breast feeding basics reviewed;Assisted with latch;Skin to skin;Hand express;Support pillows;Position options;Education  Discharge Pump: Personal WIC Program: Yes  Consult Status Consult Status: PRN    Ferol Luz 07/26/2022, 2:08 PM

## 2022-07-26 NOTE — Progress Notes (Signed)
LABOR NOTE   SUBJECTIVE:   Stepahnie Stevenson is a 26 y.o.  G1P0  at [redacted]w[redacted]d. She is resting with her epidural. She has made cervical change.  Analgesia: Epidural  OBJECTIVE:  BP 111/71 (BP Location: Right Arm)   Pulse 77   Temp 98.5 F (36.9 C) (Oral)   Resp (!) 22   Ht 4\' 10"  (1.473 m)   Wt 67.6 kg   LMP 10/28/2021 (Exact Date)   SpO2 96%   BMI 31.14 kg/m  Total I/O In: -  Out: 150 [Urine:150]  SVE:   Dilation: Lip/rim Effacement (%): 90 Station: Plus 1 Exam by:: Amelie Caracci CNM CONTRACTIONS: regular, every 2-3 minutes FHR: Fetal heart tracing reviewed. Baseline: 145 Variability: moderate Accelerations: Decelerations:late Category 2  Labs: Lab Results  Component Value Date   WBC 11.8 (H) 07/25/2022   HGB 10.7 (L) 07/25/2022   HCT 33.1 (L) 07/25/2022   MCV 79.0 (L) 07/25/2022   PLT 239 07/25/2022    ASSESSMENT: 1) Augmentation of labor, progressing well     Coping:     Membranes: ruptured, clear fluid   Principal Problem:   Labor and delivery, indication for care   PLAN: Continue present management Anticipate NSVD  Lloyd Huger, CNM 07/26/2022 1:01 AM

## 2022-07-26 NOTE — Discharge Summary (Signed)
Postpartum Discharge Summary  Date of Service updated***     Patient Name: Lori Stevenson DOB: Sep 13, 1996 MRN: [id number]  Date of admission: 07/25/2022 Delivery date:07/26/2022  Delivering provider: Lurlean Horns  Date of discharge:  Admitting diagnosis: Labor and delivery, indication for care [O75.9] Intrauterine pregnancy: [redacted]w[redacted]d    Secondary diagnosis:  Principal Problem:   Labor and delivery, indication for care  Additional problems: chorioamniotis    Discharge diagnosis: Term Pregnancy Delivered                                              Post partum procedures:{Postpartum procedures:23558} Augmentation: AROM and Pitocin Complications: Intrauterine Inflammation or infection (Chorioamniotis)  Hospital course: Onset of Labor With Vaginal Delivery      26y.o. yo G1P1001 at 347w5das admitted in Active Labor on 07/25/2022. Patient had an uncomplicated labor course as follows:  Membrane Rupture Time/Date: 1:38 PM ,07/25/2022   Delivery Method:Vaginal, Vacuum (Extractor)  Episiotomy: None  Lacerations:  Labial;2nd degree  Patient had an uncomplicated postpartum course.  She is ambulating, tolerating a regular diet, passing flatus, and urinating well. Patient is discharged home in stable condition on ....  Newborn Data: Birth date:07/26/2022  Birth time:3:59 AM  Gender:Female  Living status:Living  Apgars:8 ,9  Weight:2820 g   Magnesium Sulfate received: No BMZ received: No Rhophylac:N/A MMR:N/A T-DaP:Given prenatally Flu: N/A Transfusion:No  Physical exam  Vitals:   07/26/22 0605 07/26/22 0620 07/26/22 0634 07/26/22 0700  BP: (!) 102/53 (!) 102/58 100/63 (!) 143/74  Pulse: (!) 105 100 94 (!) 103  Resp:    16  Temp:    98.8 F (37.1 C)  TempSrc:    Oral  SpO2:      Weight:      Height:       General: {Exam; general:21111117} Lochia: {Desc; appropriate/inappropriate:30686::"appropriate"} Uterine Fundus: {Desc; firm/soft:30687} Incision: {Exam;  incision:21111123} DVT Evaluation: {Exam; dvt:2111122} Labs: Lab Results  Component Value Date   WBC 11.8 (H) 07/25/2022   HGB 10.7 (L) 07/25/2022   HCT 33.1 (L) 07/25/2022   MCV 79.0 (L) 07/25/2022   PLT 239 07/25/2022      Latest Ref Rng & Units 04/16/2022    8:14 PM  CMP  Glucose 70 - 99 mg/dL 111   BUN 6 - 20 mg/dL 10   Creatinine 0.44 - 1.00 mg/dL 0.54   Sodium 135 - 145 mmol/L 137   Potassium 3.5 - 5.1 mmol/L 3.5   Chloride 98 - 111 mmol/L 103   CO2 22 - 32 mmol/L 24   Calcium 8.9 - 10.3 mg/dL 9.1   Total Protein 6.5 - 8.1 g/dL 6.7   Total Bilirubin 0.3 - 1.2 mg/dL 0.4   Alkaline Phos 38 - 126 U/L 51   AST 15 - 41 U/L 25   ALT 0 - 44 U/L 20    Edinburgh Score:     No data to display            After visit meds:  Allergies as of 07/26/2022   No Known Allergies   Med Rec must be completed prior to using [facility]        Discharge home in stable condition Infant Feeding: {Baby feeding:23562} Infant Disposition:{CHL IP OB HOME WITH MODVVOHY:07371}ischarge instruction: per After Visit Summary and Postpartum booklet. Activity: Advance as tolerated. Pelvic rest  for 6 weeks.  Diet: {OB diet:21111121} Anticipated Birth Control: {Birth Control:23956} Postpartum Appointment:{Outpatient follow up:23559} Additional Postpartum F/U: {PP Procedure:23957} Future Appointments: Future Appointments  Date Time Provider [place]  07/31/2022  8:30 AM Lurlean Horns, CNM [facility] None  08/06/2022  8:30 AM Rubie Maid, MD [facility] None  10/24/2022  9:50 AM Pieter Partridge, DO [facility] None   Follow up Visit:

## 2022-07-27 LAB — CBC
HCT: 23.3 % — ABNORMAL LOW (ref 36.0–46.0)
Hemoglobin: 7.5 g/dL — ABNORMAL LOW (ref 12.0–15.0)
MCH: 25.6 pg — ABNORMAL LOW (ref 26.0–34.0)
MCHC: 32.2 g/dL (ref 30.0–36.0)
MCV: 79.5 fL — ABNORMAL LOW (ref 80.0–100.0)
Platelets: 254 10*3/uL (ref 150–400)
RBC: 2.93 MIL/uL — ABNORMAL LOW (ref 3.87–5.11)
RDW: 14.2 % (ref 11.5–15.5)
WBC: 13.8 10*3/uL — ABNORMAL HIGH (ref 4.0–10.5)
nRBC: 0 % (ref 0.0–0.2)

## 2022-07-27 MED ORDER — SODIUM CHLORIDE 0.9 % IV SOLN
250.0000 mg | Freq: Once | INTRAVENOUS | Status: AC
  Administered 2022-07-27: 250 mg via INTRAVENOUS
  Filled 2022-07-27: qty 20

## 2022-07-27 MED ORDER — SODIUM CHLORIDE 0.9 % IV SOLN: INTRAVENOUS | Status: DC | PRN

## 2022-07-27 MED ORDER — IRON 325 (65 FE) MG PO TABS: 1.0000 | ORAL_TABLET | Freq: Once | ORAL | 3 refills | Status: DC

## 2022-07-27 NOTE — Lactation Note (Signed)
This note was copied from a baby's chart. Lactation Consultation Note  Patient Name: Lori Stevenson Date: 07/27/2022 Reason for consult: Follow-up assessment Age:25 hours  Maternal Data This is mom's first baby, vaginal/vacuum delivery. Mom is exclusively breastfeeding. Today mom requested [place] assistance as she is experiencing left nipple pain. Has patient been taught Hand Expression?: Yes Does the patient have breastfeeding experience prior to this delivery?: No  Feeding Mother's Current Feeding Choice: Breast Milk Assisted mom with breastfeeding. Provided mom with tips and strategies to maximize latch and positioning techniques.Mom was able to independently correct latch and position baby at breast. Mom noted to have crack on left nipple. She is using pure-lan to the nipple for moist wound healing. Recommended mom offer baby to the right breast first for a few consecutive breastfeedings as the baby's suck will be strongest at the beginning of a fed. Also, recommended alternating positions at the left breast. Instructed mom on optimizing the football hold. Mom initially with nipple pain upon latch on which eased in 15-20 seconds.   LATCH Score Latch: Grasps breast easily, tongue down, lips flanged, rhythmical sucking. (Baby rolls lips inward. instructed mom on how to flange lips outward on to her areola. Mom was able to independently correct baby's latch.)  Audible Swallowing: Spontaneous and intermittent  Type of Nipple: Everted at rest and after stimulation  Comfort (Breast/Nipple): Filling, red/small blisters or bruises, mild/mod discomfort (left nipple with compression stripe that has cracked. Mom is using pure-lan .)  Hold (Positioning): Assistance needed to correctly position infant at breast and maintain latch.  LATCH Score: 8   Lactation Tools Discussed/Used Tools: Other (comment) (lanolin)  Interventions Interventions: Breast feeding basics reviewed;Assisted  with latch;Breast massage;Breast compression;Adjust position;Support pillows;Position options;Education (Pure-lan)  Discharge Discharge Education: Warning signs for feeding baby;Engorgement and breast care;Outpatient recommendation (Mom is aware she can call [facility] lactation if she has additional questions or would like to schedule an outpatient appointment for further breastfeeding assistance once she goes home.) Pump: Personal  Consult Status Consult Status: PRN Date: 07/27/22 Follow-up type: In-patient  Update provided to care nurse.  Jonna Babe Anthis 07/27/2022, 1:35 PM

## 2022-07-27 NOTE — Progress Notes (Signed)
Progress Note - Vaginal Delivery  Lori Stevenson is a 26 y.o. G1P1001 now PP day 1 s/p Vaginal, Vacuum (Extractor) . IS interested in going home later on today. Discussed H&H , pt denies any dizziness or light headedness. She is ambulating well. She is fatigued but otherwise feeling fine.   Subjective:  The patient reports no complaints, up ad lib, voiding, tolerating PO, and + flatus   Objective:  Vital signs in last 24 hours: Temp:  [98.1 F (36.7 C)-98.6 F (37 C)] 98.4 F (36.9 C) (09/23 0900) Pulse Rate:  [77-91] 83 (09/23 0900) Resp:  [17-18] 18 (09/23 0900) BP: (93-110)/(54-76) 104/66 (09/23 0900) SpO2:  [100 %] 100 % (09/23 0900)  Physical Exam:  General: alert, cooperative, appears stated age, fatigued, and no distress Lochia: appropriate Uterine Fundus: firm    Data Review Recent Labs    07/26/22 0719 07/27/22 0711  HGB 8.7* 7.5*  HCT 27.0* 23.3*    Assessment/Plan: Principal Problem:   Labor and delivery, indication for care   Contraception undecided. Will do IV iron today and plan of discharge later this afternoon if she continues to do well.    -- Continue routine PP care.     Philip Aspen, CNM  07/27/2022 10:40 AM

## 2022-07-27 NOTE — Final Progress Note (Signed)
Final Progress Note  Patient ID: Satori Krabill MRN: [id number] DOB/AGE: 25-17-1997 25 y.o.  Admit date: 07/25/2022 Admitting provider: Harlin Heys, MD Delivering provider Barrington Ellison  Discharge date: 07/27/2022   Admission Diagnoses: labor and delivery indication for care  Discharge Diagnoses:  Principal Problem:   Labor and delivery, indication for care    Consults: None  Significant Findings/ Diagnostic Studies: labs: Hemoglobin 7.5g/dL HCT 23.3%   Procedures: Iron transfusion   Discharge Condition: good  Disposition: Discharge disposition: 01-Home or Self Care       Diet: Regular diet  Discharge Activity: No heavy lifting, pushing, pulling with the implant side for 2 months and No sex for 6 weeks   Allergies as of 07/27/2022   No Known Allergies      Medication List     TAKE these medications    Iron 325 (65 Fe) MG Tabs Take 1 tablet (325 mg total) by mouth once for 1 dose.   PRENATAL PO Take by mouth.         Total time spent taking care of this patient: 15 minutes  Signed: Philip Aspen 07/27/2022, 3:33 PM

## 2022-07-27 NOTE — Progress Notes (Signed)
Pt discharged with infant.  Discharge instructions, prescriptions and follow up appointment given to and reviewed with pt. Pt verbalized understanding. Escorted out by auxillary. 

## 2022-07-30 NOTE — Anesthesia Postprocedure Evaluation (Signed)
Anesthesia Post Note  Patient: Lori Stevenson  Procedure(s) Performed: AN AD [place]  Patient location during evaluation: Mother Baby Anesthesia Type: Epidural Level of consciousness: awake and alert Pain management: pain level controlled Vital Signs Assessment: post-procedure vital signs reviewed and stable Respiratory status: spontaneous breathing, nonlabored ventilation and respiratory function stable Cardiovascular status: stable Postop Assessment: no headache, no backache and epidural receding Anesthetic complications: no   No notable events documented.   Last Vitals: There were no vitals filed for this visit.  Last Pain: There were no vitals filed for this visit.               [place] Ouch

## 2022-07-31 ENCOUNTER — Encounter: Payer: Medicaid Other | Admitting: Obstetrics

## 2022-07-31 LAB — SURGICAL PATHOLOGY

## 2022-08-06 ENCOUNTER — Encounter: Payer: Medicaid Other | Admitting: Obstetrics and Gynecology

## 2022-08-14 ENCOUNTER — Telehealth (INDEPENDENT_AMBULATORY_CARE_PROVIDER_SITE_OTHER): Payer: Medicaid Other | Admitting: Obstetrics

## 2022-08-14 NOTE — Progress Notes (Signed)
Virtual Visit via Video Note  I connected with Lori Stevenson on 08/14/22 at 11:30 AM EDT by a video enabled telemedicine application and verified that I am speaking with the correct person using two identifiers.  Location: Patient: home Provider: office   I discussed the limitations of evaluation and management by telemedicine and the availability of in person appointments. The patient expressed understanding and agreed to proceed.  History of Present Illness: Lori Stevenson is a 26 y.o. G1P1001 who is s/p VAVD of a viable female, "Lori Stevenson," on 07/26/22. Her labor was complicated by chorioamnionitis and NRFHT in the second stage. She received epidural anesthesia and had a labial laceration and second degree vaginal laceration. She is breast and bottle feeding.   Observations/Objective: Mood: Feels well overall. Depression screen negative. Pain: Occasional pain in vaginal laceration, improving Breasts: no concerns. Supplementing with formula d/t infant weight loss Appetite: Not great but is trying to eat frequent snacks Bowel/Bladder: Normal Perineum: healing well Bleeding: minimal Infant: Doing well. Following with peds  Assessment and Plan: 2 weeks PP Normal healing Reviewed baby blues and PP mood changes. Discussed protected sleep and sources of support. Discussed timeline for resumption of normal activities. Reviewed when to seek medical assistance.  Schedule 6 week office visit  Follow Up Instructions:    I discussed the assessment and treatment plan with the patient. The patient was provided an opportunity to ask questions and all were answered. The patient agreed with the plan and demonstrated an understanding of the instructions.   The patient was advised to call back or seek an in-person evaluation if the symptoms worsen or if the condition fails to improve as anticipated.  I provided 14 minutes of non-face-to-face time during this encounter.   Lurlean Horns,  CNM

## 2022-08-16 NOTE — Telephone Encounter (Signed)
Please schedule patient for 6 week PPV ,Thank you!

## 2022-09-03 ENCOUNTER — Encounter: Payer: Self-pay | Admitting: Obstetrics

## 2022-09-03 ENCOUNTER — Ambulatory Visit (INDEPENDENT_AMBULATORY_CARE_PROVIDER_SITE_OTHER): Payer: Medicaid Other | Admitting: Obstetrics

## 2022-09-03 VITALS — BP 103/70 | HR 99 | Ht <= 58 in | Wt 134.0 lb

## 2022-09-03 DIAGNOSIS — F419 Anxiety disorder, unspecified: Secondary | ICD-10-CM

## 2022-09-03 MED ORDER — LEVONORGEST-ETH ESTRAD 91-DAY 0.15-0.03 &0.01 MG PO TABS: 1.0000 | ORAL_TABLET | Freq: Every day | ORAL | 4 refills | Status: DC

## 2022-09-03 NOTE — Progress Notes (Signed)
Post Partum Visit Note  Lori Stevenson is a 26 y.o. G13P1001 female who presents for a postpartum visit. She is 6 weeks postpartum following a vacuum-assisted vaginal delivery.  I have fully reviewed the prenatal and intrapartum course and was present at the birth. The delivery was at 38+5 gestational weeks.  Anesthesia: epidural. Postpartum course has been uncomplicated. Baby is doing well. Baby is feeding by bottle. Bleeding:  menses have resumed . Bowel function is normal. Bladder function is normal. Patient is not sexually active. Contraception method is OCP (estrogen/progesterone). Postpartum depression screening: negative. She does note some anxiety symptoms. She does not feel like others can safely care for her baby and she has trouble sleeping even when someone else is taking care of the baby. This feels overwhelming to her at times.   [place] - 09/03/22 1705       Pregnancy Intention Screening   Does the patient want to become pregnant in the next year? No    Does the patient's partner want to become pregnant in the next year? No    Would the patient like to discuss contraceptive options today? Yes      Contraception Wrap Up   Current Method Abstinence    End Method Oral Contraceptive    Contraception Counseling Provided Yes    How was the end contraceptive method provided? Prescription            The pregnancy intention screening data noted above was reviewed. Potential methods of contraception were discussed. The patient elected to proceed with Oral Contraceptive.   Edinburgh Postnatal Depression Scale - 09/03/22 1535       Edinburgh Postnatal Depression Scale:  In the Past 7 Days   I have been able to laugh and see the funny side of things. 0    I have looked forward with enjoyment to things. 0    I have blamed myself unnecessarily when things went wrong. 1    I have been anxious or worried for no good reason. 0    I have felt scared or panicky for no good  reason. 0    Things have been getting on top of me. 0    I have been so unhappy that I have had difficulty sleeping. 0    I have felt sad or miserable. 0    I have been so unhappy that I have been crying. 0    The thought of harming myself has occurred to me. 0    Edinburgh Postnatal Depression Scale Total 1             Health Maintenance Due  Topic Date Due   COVID-19 Vaccine (1) Never done   HPV VACCINES (1 - 2-dose series) Never done   INFLUENZA VACCINE  Never done    The following portions of the patient's history were reviewed and updated as appropriate: allergies, current medications, past family history, past medical history, past social history, past surgical history, and problem list.  Review of Systems Pertinent items noted in HPI and remainder of comprehensive ROS otherwise negative.  Objective:  BP 103/70   Pulse 99   Ht 4\' 10"  (1.473 m)   Wt 134 lb (60.8 kg)   LMP 09/01/2022 (Exact Date)   Breastfeeding No   BMI 28.01 kg/m    General:  alert, cooperative, and appears stated age   Breasts:  normal  Lungs: clear to auscultation bilaterally  Heart:  regular rate and rhythm, S1, S2 normal,  no murmur, click, rub or gallop  Abdomen: soft, non-tender; bowel sounds normal; no masses,  no organomegaly   Wound : lacerations well-healed  GU exam:  normal       Assessment:    There are no diagnoses linked to this encounter. Normal 6-week postpartum exam Anxiety  Essential components of care per ACOG recommendations:  1.  Mood and well being: Patient with negative depression screening today. Reviewed local resources for support.  - Patient tobacco use? No.   - hx of drug use? No.    2. Infant care and feeding:  -Patient currently breastmilk feeding? No.  -Social determinants of health (SDOH) reviewed in EPIC. No concerns.  3. Sexuality, contraception and birth spacing - Patient does not want a pregnancy in the next year.  - Reviewed reproductive life  planning. Reviewed contraceptive methods based on pt preferences and effectiveness.  Patient desired  today.    4. Sleep and fatigue -Encouraged family/partner/community support of 4 hrs of uninterrupted sleep to help with mood and fatigue  5. Physical Recovery  - Discussed patients delivery and complications. She describes her labor as "okay - I don't remember a lot of it." - Patient had a  VAVD . Patient had a  2nd degree labial  laceration. Perineal healing reviewed. Patient expressed understanding - Patient has urinary incontinence? No. - Patient is safe to resume physical and sexual activity  6.  Health Maintenance - HM due items addressed Yes - Last pap smear  Diagnosis  Date Value Ref Range Status  02/14/2022   Final   - Negative for intraepithelial lesion or malignancy (NILM)   Pap smear not done at today's visit.  -Breast Cancer screening indicated? No.   7. Chronic Disease/Pregnancy Condition follow up: None  - PCP follow up  Lurlean Horns, [facility] [place], [place]

## 2022-10-19 IMAGING — US US RENAL
1 series · 14 of 25 positions shown · non-contrast
Comparison: None Available.

CLINICAL DATA: Pregnant, flank pain.

EXAM:
RENAL / URINARY TRACT ULTRASOUND COMPLETE

[Series 1: us renal · 14 of 29 slices shown]
[im 1/29]
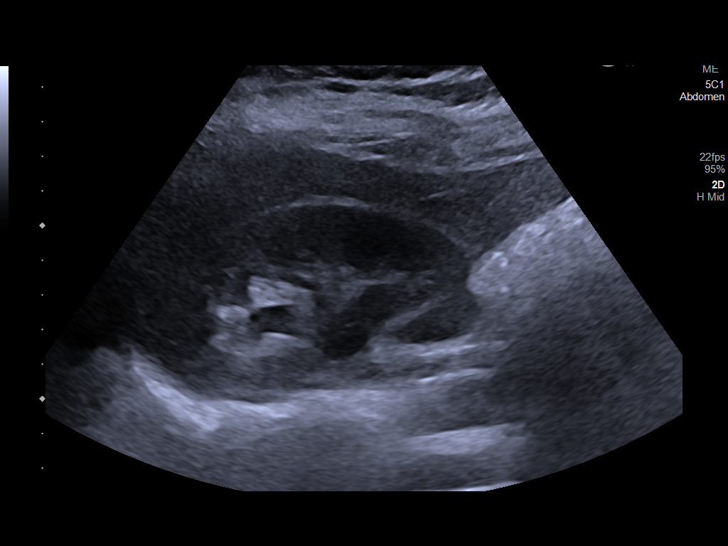
[im 3/29]
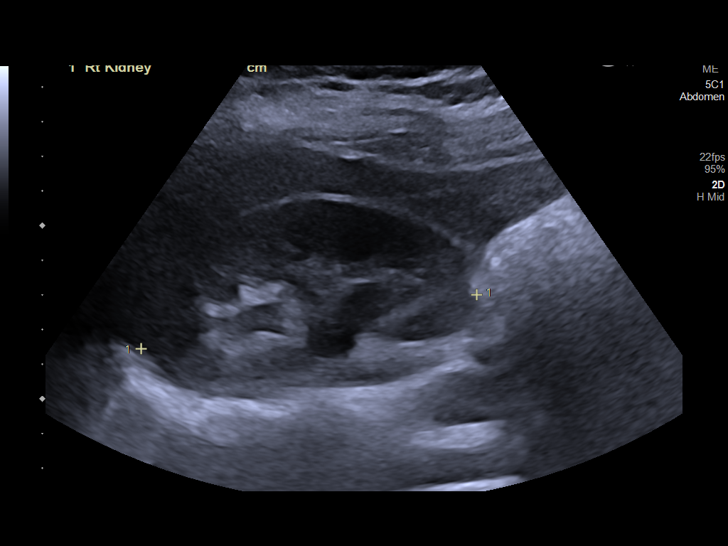
[im 5/29]
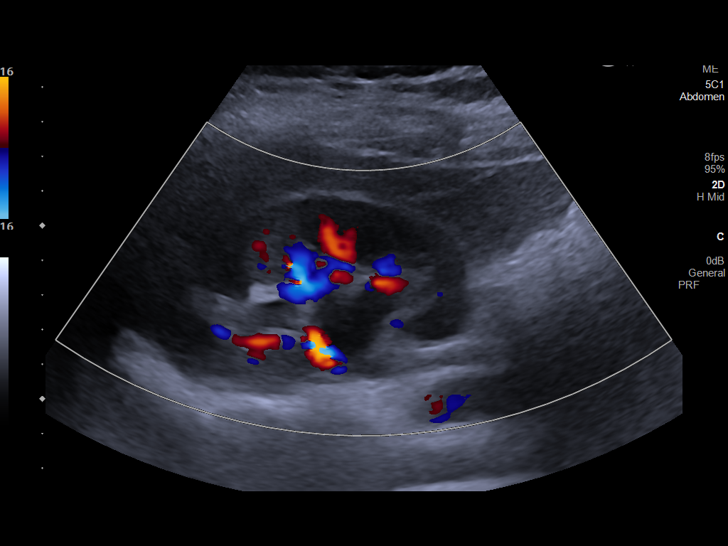
[im 8/29]
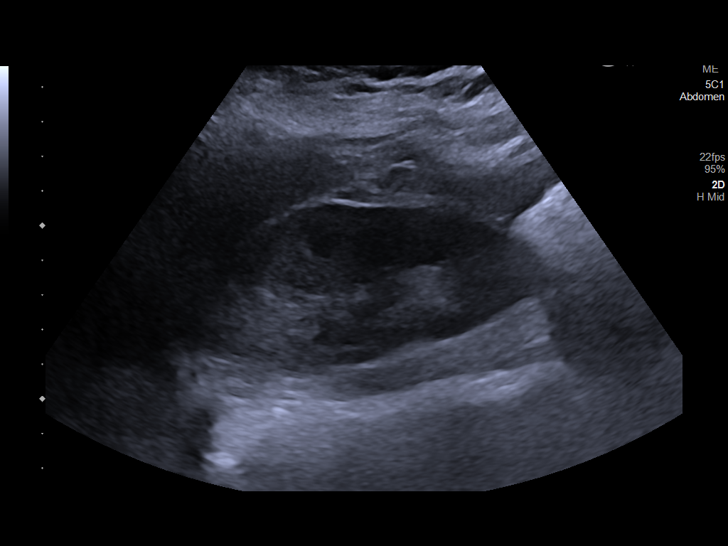
[im 10/29]
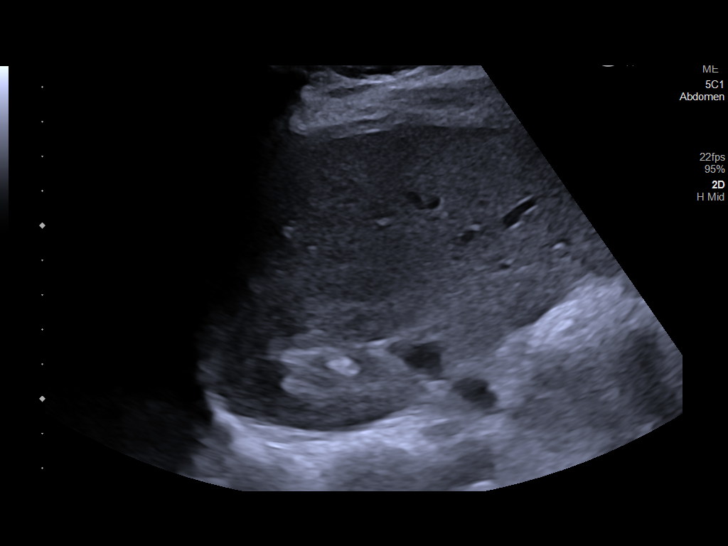
[im 11/29]
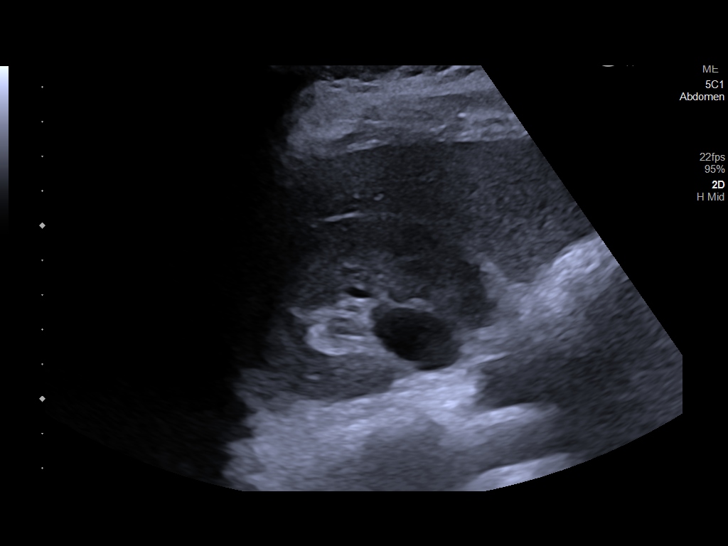
[im 13/29]
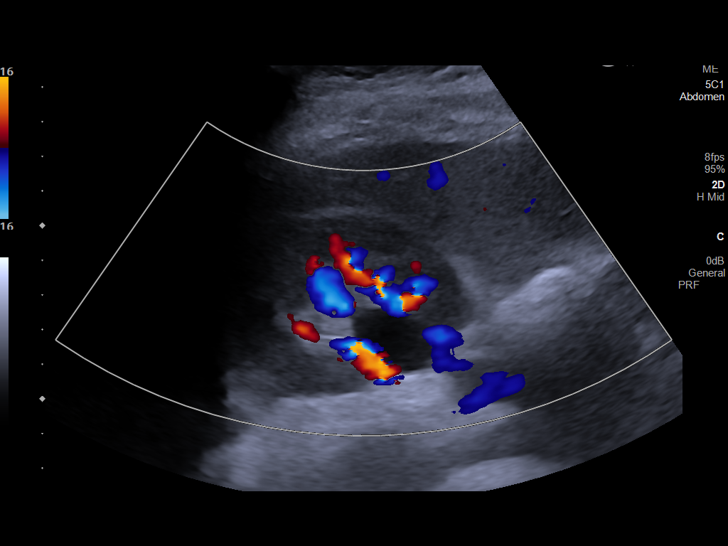
[im 16/29]
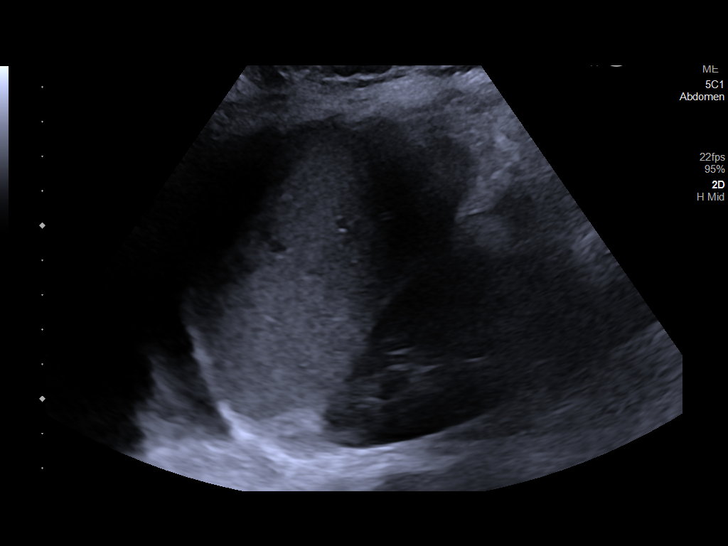
[im 18/29]
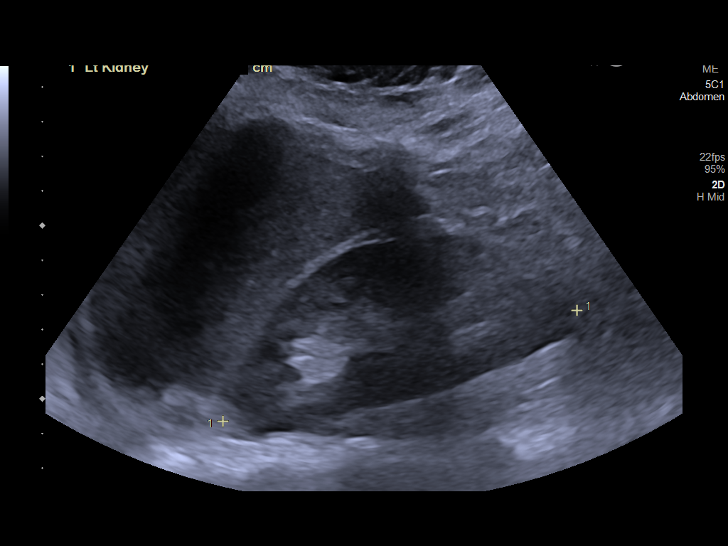
[im 19/29]
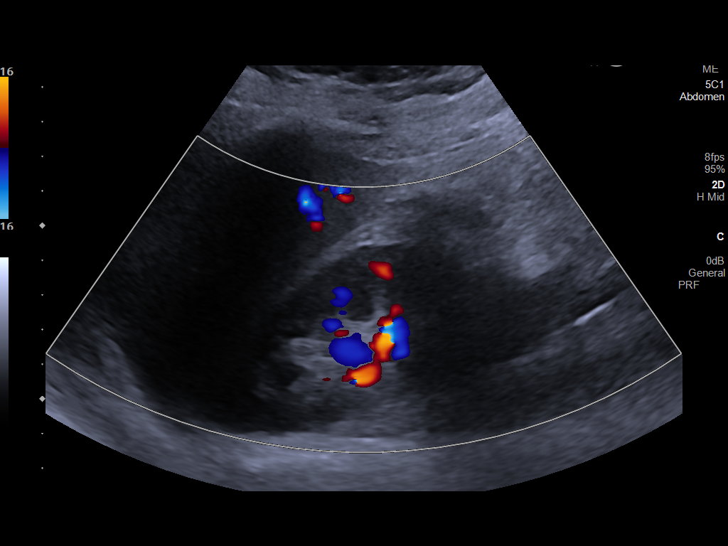
[im 22/29]
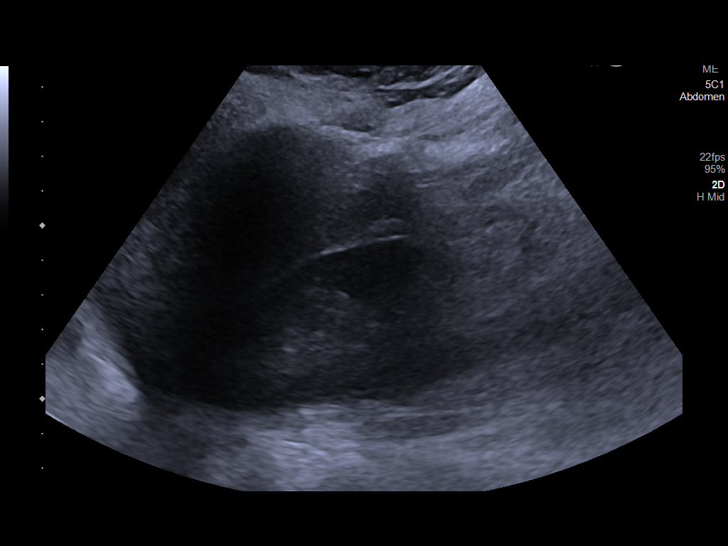
[im 24/29]
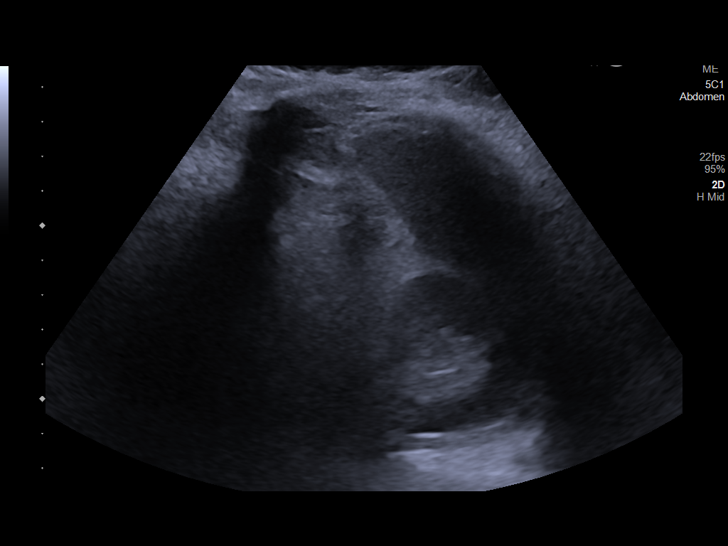
[im 26/29]
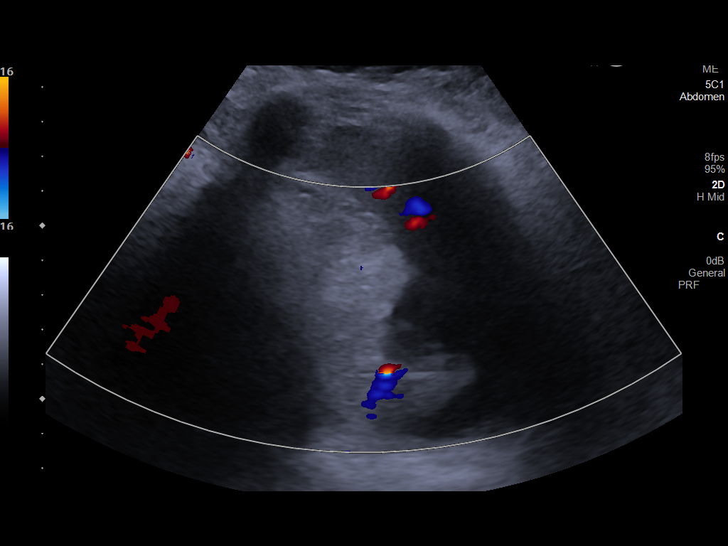
[im 29/29]
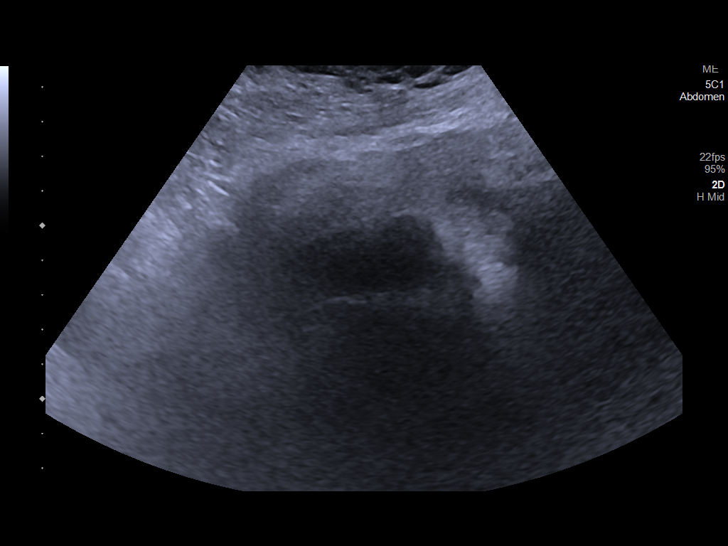

[14 of 25 positions shown; findings below may reference images not displayed]

FINDINGS: Right Kidney:

Renal measurements: 9.8 x 6.1 x 6.1 cm = volume: 190 mL. Renal
cortical echogenicity is normal and cortical thickness is preserved.
There is mild right hydronephrosis. No intrarenal masses or
calcifications are seen. No perinephric fluid collections are
present.

Left Kidney:

Renal measurements: 10.7 x 4.7 x 4.3 cm = volume: 114 mL.
Echogenicity within normal limits. No mass or hydronephrosis
visualized.

Bladder:

Largely decompressed

Other:

None.
IMPRESSION: Mild right hydronephrosis.

## 2022-10-22 NOTE — Progress Notes (Deleted)
NEUROLOGY FOLLOW UP OFFICE NOTE  Lori Stevenson [id number]  Assessment/Plan:   New onset headache in pregnant patient - suspect migraine with aura.  However, since she still endorses altered/blurred vision, I would like to rule out a cerebral sinus thrombosis Pregnant at [redacted] weeks gestation   MRV of head without contrast to rule out cerebral venous thrombosis May take Tylenol as needed for another severe headache attack Limit use of pain relievers to no more than 2 days out of week to prevent risk of rebound or medication-overuse headache. Follow up in 4 months or sooner if needed.     Subjective:  Lori Stevenson is a 26 year old right-handed female with asthma who follows up for migraines  UPDATE: She was unable to get the MRV because ***.  She delivered a baby *** on 07/26/2022.  ***   Intensity:  *** Duration:  *** Frequency:  *** Current NSAIDS/analgesics:  Tylenol Current triptans:  none Current ergotamine:  none Current anti-emetic:  none Current muscle relaxants:  none Current Antihypertensive medications:  none Current Antidepressant medications:  none Current Anticonvulsant medications:  none Current anti-CGRP:  none Current Vitamins/Herbal/Supplements:  prenatal Current Antihistamines/Decongestants:  none Other therapy:  none Hormone/birth control:  none  HISTORY:  She is currently pregnant at [redacted] weeks gestation.  Over this past weekend, she woke up at 3 AM and had sudden vision loss lasting for approximately 30 seconds which returned but continued to have blurred vision.  She looked up her symptoms on the internet but had difficulty comprehending what she was reading.  She woke up her husband and had trouble speaking.  This lasted about 20 minutes.  She went back to sleep and when she woke up at 6 AM, she then developed a severe bilateral frontal/temporal/top of head pounding/squeezing headache associated with nausea, photophobia and phonophobia.  It was  severe from 6-7 AM and then had mild a mild headache for rest of day.  Treated with Tylenol with mld relief.  Since then, she has occasional mild headaches lasting just a couple seconds.  She wears contact lenses.  She hasn't had any acute vision changes but thinks that her vision overall is more blurred.     Denies prior history of headaches. She does report recent increased emotional stress.   Past NSAIDS/analgesics:  none Past abortive triptans:  none Past abortive ergotamine:  none Past muscle relaxants:  none Past anti-emetic:  none Past antihypertensive medications:  none Past antidepressant medications:  none Past anticonvulsant medications:  none Past anti-CGRP:  none Past vitamins/Herbal/Supplements:  none Past antihistamines/decongestants:  none Other past therapies:  none     PAST MEDICAL HISTORY: Past Medical History:  Diagnosis Date   Asthma     MEDICATIONS: Current Outpatient Medications on File Prior to Visit  Medication Sig Dispense Refill   Ferrous Sulfate (IRON) 325 (65 Fe) MG TABS Take 1 tablet (325 mg total) by mouth once for 1 dose. 30 tablet 3   Levonorgestrel-Ethinyl Estradiol (AMETHIA) 0.15-0.03 &0.01 MG tablet Take 1 tablet by mouth daily. 91 tablet 4   Prenatal Vit-Fe Fumarate-FA (PRENATAL PO) Take by mouth.     [DISCONTINUED] omeprazole (PRILOSEC OTC) 20 MG tablet Take 1 tablet (20 mg total) by mouth daily. 28 tablet 1   [DISCONTINUED] promethazine (PHENERGAN) 25 MG tablet Take 25 mg by mouth every 6 (six) hours as needed for nausea or vomiting.     [DISCONTINUED] sucralfate (CARAFATE) 1 g tablet Take 1 tablet (1 g total) by  mouth 4 (four) times daily as needed (for abdominal discomfort, nausea, and/or vomiting). 30 tablet 1   No current facility-administered medications on file prior to visit.    ALLERGIES: No Known Allergies  FAMILY HISTORY: Family History  Problem Relation Age of Onset   Thyroid disease Mother    Breast cancer Neg Hx     Colon cancer Neg Hx       Objective:  *** General: No acute distress.  Patient appears ***-groomed.   Head:  Normocephalic/atraumatic Eyes:  Fundi examined but not visualized Neck: supple, no paraspinal tenderness, full range of motion Heart:  Regular rate and rhythm Lungs:  Clear to auscultation bilaterally Back: No paraspinal tenderness Neurological Exam: alert and oriented to person, place, and time.  Speech fluent and not dysarthric, language intact.  CN II-XII intact. Bulk and tone normal, muscle strength 5/5 throughout.  Sensation to light touch intact.  Deep tendon reflexes 2+ throughout, toes downgoing.  Finger to nose testing intact.  Gait normal, Romberg negative.   Shon Millet, DO  CC: ***

## 2022-10-24 ENCOUNTER — Ambulatory Visit: Payer: Medicaid Other | Admitting: Neurology

## 2022-10-24 ENCOUNTER — Encounter: Payer: Self-pay | Admitting: Neurology

## 2022-10-31 ENCOUNTER — Ambulatory Visit: Payer: Medicaid Other

## 2023-07-17 ENCOUNTER — Ambulatory Visit
Admission: EM | Admit: 2023-07-17 | Discharge: 2023-07-17 | Disposition: A | Discharge status: Home / Self Care | Payer: Medicaid Other | Attending: Family Medicine | Admitting: Family Medicine

## 2023-07-17 DIAGNOSIS — J4531 Mild persistent asthma with (acute) exacerbation: Secondary | ICD-10-CM | POA: Diagnosis not present

## 2023-07-17 DIAGNOSIS — J069 Acute upper respiratory infection, unspecified: Secondary | ICD-10-CM | POA: Diagnosis not present

## 2023-07-17 MED ORDER — IPRATROPIUM BROMIDE 0.06 % NA SOLN: 2.0000 | Freq: Four times a day (QID) | NASAL | 0 refills | Status: AC

## 2023-07-17 MED ORDER — IBUPROFEN 600 MG PO TABS: 600.0000 mg | ORAL_TABLET | Freq: Four times a day (QID) | ORAL | 0 refills | Status: DC | PRN

## 2023-07-17 MED ORDER — ALBUTEROL SULFATE HFA 108 (90 BASE) MCG/ACT IN AERS: 2.0000 | INHALATION_SPRAY | RESPIRATORY_TRACT | 2 refills | Status: DC | PRN

## 2023-07-17 NOTE — Discharge Instructions (Addendum)
You have a viral respiratory infection.  Stop by the pharmacy to pick up your prescriptions.  Follow up with your primary care provider as needed.  You can take Tylenol and/or Ibuprofen as needed for fever reduction and pain relief.    For cough: honey 1/2 to 1 teaspoon (you can dilute the honey in water or another fluid).  You can also use guaifenesin and dextromethorphan for cough. You can use a humidifier for chest congestion and cough.  If you don't have a humidifier, you can sit in the bathroom with the hot shower running.      For sore throat: try warm salt water gargles, Mucinex sore throat cough drops or cepacol lozenges, throat spray, warm tea or water with lemon/honey, popsicles or ice, or OTC cold relief medicine for throat discomfort. You can also purchase chloraseptic spray at the pharmacy or dollar store.   For congestion: take a daily anti-histamine like Zyrtec, Claritin, and a oral decongestant, such as pseudoephedrine.  You can also use Flonase 1-2 sprays in each nostril daily. Afrin is also a good option, if you do not have high blood pressure.    It is important to stay hydrated: drink plenty of fluids (water, gatorade/powerade/pedialyte, juices, or teas) to keep your throat moisturized and help further relieve irritation/discomfort.    Return or go to the Emergency Department if symptoms worsen or do not improve in the next few days

## 2023-07-17 NOTE — ED Triage Notes (Addendum)
Pt c/o cough, facial pressure, nasal congestion x3days  Pt has a history of asthma and states that her chest feels tight.   Pt declines a covid test and states that she had covid last month  Pt asks for a refill on her inhaler.

## 2023-07-17 NOTE — ED Provider Notes (Signed)
[facility]    CSN: [id number] Arrival date & time: 07/17/23  1102      History   Chief Complaint Chief Complaint  Patient presents with   Cough   Facial Pain   Nasal Congestion         HPI Beverlie Crest is a 27 y.o. female.   HPI  History obtained from the patient. Endiyah presents for cough, sore throat, rhinorrhea with nasal congestion and sinus pressure that started 2-3 days ago. Has pressure on her forehead. Tried Tylenol cold and flu but that is not helping. Has headache and chills but no fever. No vomiting, nausea or diarrhea.  No known sick contacts. She [facility] at the [employer].    Has asthma and feels like her chest is tight.  She ran out her albuterol inhaler and was wheezing yesterday.       Past Medical History:  Diagnosis Date   Asthma     Patient Active Problem List   Diagnosis Date Noted   [redacted] weeks gestation of pregnancy 06/23/2022   Encounter for supervision of normal first pregnancy in third trimester 06/18/2022   Labor and delivery, indication for care 04/13/2022   Vaginal bleeding in pregnancy, second trimester 04/07/2022   Hematuria of unknown cause 04/07/2022   History of asthma 03/13/2022   Rh negative state in antepartum period 03/13/2022    Past Surgical History:  Procedure Laterality Date   NO PAST SURGERIES      OB History     Gravida  1   Para  1   Term  1   Preterm      AB      Living  1      SAB      IAB      Ectopic      Multiple  0   Live Births  1            Home Medications    Prior to Admission medications   Medication Sig Start Date End Date Taking? Authorizing Provider  albuterol (VENTOLIN HFA) 108 (90 Base) MCG/ACT inhaler Inhale 2 puffs into the lungs every 4 (four) hours as needed. 07/17/23  Yes Mylez Venable, DO  ibuprofen (ADVIL) 600 MG tablet Take 1 tablet (600 mg total) by mouth every 6 (six) hours as needed. 07/17/23  Yes Toa Mia, DO  ipratropium (ATROVENT) 0.06 %  nasal spray Place 2 sprays into both nostrils 4 (four) times daily. 07/17/23  Yes Gurtaj Ruz, Seward Meth, DO  Levonorgestrel-Ethinyl Estradiol (AMETHIA) 0.15-0.03 &0.01 MG tablet Take 1 tablet by mouth daily. 09/03/22  Yes Glenetta Borg, CNM  Prenatal Vit-Fe Fumarate-FA (PRENATAL PO) Take by mouth.   Yes [provider]  Ferrous Sulfate (IRON) 325 (65 Fe) MG TABS Take 1 tablet (325 mg total) by mouth once for 1 dose. 07/27/22 07/27/22  Doreene Burke, CNM  omeprazole (PRILOSEC OTC) 20 MG tablet Take 1 tablet (20 mg total) by mouth daily. 02/13/17 12/22/19  Loleta Rose, MD  promethazine (PHENERGAN) 25 MG tablet Take 25 mg by mouth every 6 (six) hours as needed for nausea or vomiting.  12/22/19  [provider]  sucralfate (CARAFATE) 1 g tablet Take 1 tablet (1 g total) by mouth 4 (four) times daily as needed (for abdominal discomfort, nausea, and/or vomiting). 02/13/17 12/22/19  Loleta Rose, MD    Family History Family History  Problem Relation Age of Onset   Thyroid disease Mother    Breast cancer Neg Hx  Colon cancer Neg Hx     Social History Social History   Tobacco Use   Smoking status: Never    Passive exposure: Never   Smokeless tobacco: Never  Vaping Use   Vaping status: Never Used  Substance Use Topics   Alcohol use: No   Drug use: No     Allergies   Patient has no known allergies.   Review of Systems Review of Systems: negative unless otherwise stated in HPI.      Physical Exam Triage Vital Signs ED Triage Vitals  Encounter Vitals Group     BP 07/17/23 1202 107/74     Systolic BP Percentile --      Diastolic BP Percentile --      Pulse Rate 07/17/23 1202 76     Resp --      Temp 07/17/23 1202 98.2 F (36.8 C)     Temp Source 07/17/23 1202 Oral     SpO2 07/17/23 1202 98 %     Weight 07/17/23 1200 134 lb (60.8 kg)     Height 07/17/23 1200 4\' 10"  (1.473 m)     Head Circumference --      Peak Flow --      Pain Score 07/17/23 1200 6      Pain Loc --      Pain Education --      Exclude from Growth Chart --    No data found.  Updated Vital Signs BP 107/74 (BP Location: Left Arm)   Pulse 76   Temp 98.2 F (36.8 C) (Oral)   Ht 4\' 10"  (1.473 m)   Wt 60.8 kg   LMP 07/15/2023   SpO2 98%   BMI 28.01 kg/m   Visual Acuity Right Eye Distance:   Left Eye Distance:   Bilateral Distance:    Right Eye Near:   Left Eye Near:    Bilateral Near:     Physical Exam GEN:     alert, ill but non-toxic appearing female in no distress    HENT:  mucus membranes moist, oropharyngeal without lesions or erythema, no tonsillar hypertrophy or exudates,  moderate erythematous edematous turbinates, clear nasal discharge EYES:   pupils equal and reactive, no scleral injection or discharge NECK:  normal ROM, no meningismus   RESP:  no increased work of breathing, faint scattered expiratory wheezing, no rhonchi, no rales CVS:   regular rate and rhythm Skin:   warm and dry, no rash on visible skin    UC Treatments / Results  Labs (all labs ordered are listed, but only abnormal results are displayed) Labs Reviewed - No data to display  EKG   Radiology No results found.  Procedures Procedures (including critical care time)  Medications Ordered in UC Medications - No data to display  Initial Impression / Assessment and Plan / UC Course  I have reviewed the triage vital signs and the nursing notes.  Pertinent labs & imaging results that were available during my care of the patient were reviewed by me and considered in my medical decision making (see chart for details).       Pt is a 27 y.o. female with history of asthma presents for 3 days of respiratory symptoms. Yasmyn is afebrile here. Satting well on room air. Overall pt is ill but non-toxic appearing, well hydrated, without respiratory distress. Pulmonary exam is remarkable aint scattered expiratory wheezing.  COVID testing declined as she states she had COVID last month.  History consistent with viral  respiratory illness. Discussed symptomatic treatment.  Explained lack of efficacy of antibiotics in viral disease.  Typical duration of symptoms discussed.  Recommended steroids for acute asthma flare likely related to viral illness but she declined. States she does not like the way they make her feel.  She does need a refill on her albuterol inhaler.  Atrovent nasal spray for nasal congestion and ibuprofen for facial pain and headache.   Return and ED precautions given and voiced understanding. Discussed MDM, treatment plan and plan for follow-up with patient who agrees with plan.     Final Clinical Impressions(s) / UC Diagnoses   Final diagnoses:  URI with cough and congestion  Mild persistent asthma with acute exacerbation     Discharge Instructions      You have a viral respiratory infection.  Stop by the pharmacy to pick up your prescriptions.  Follow up with your primary care provider as needed.  You can take Tylenol and/or Ibuprofen as needed for fever reduction and pain relief.    For cough: honey 1/2 to 1 teaspoon (you can dilute the honey in water or another fluid).  You can also use guaifenesin and dextromethorphan for cough. You can use a humidifier for chest congestion and cough.  If you don't have a humidifier, you can sit in the bathroom with the hot shower running.      For sore throat: try warm salt water gargles, Mucinex sore throat cough drops or cepacol lozenges, throat spray, warm tea or water with lemon/honey, popsicles or ice, or OTC cold relief medicine for throat discomfort. You can also purchase chloraseptic spray at the pharmacy or dollar store.   For congestion: take a daily anti-histamine like Zyrtec, Claritin, and a oral decongestant, such as pseudoephedrine.  You can also use Flonase 1-2 sprays in each nostril daily. Afrin is also a good option, if you do not have high blood pressure.    It is important to stay hydrated: drink  plenty of fluids (water, gatorade/powerade/pedialyte, juices, or teas) to keep your throat moisturized and help further relieve irritation/discomfort.    Return or go to the Emergency Department if symptoms worsen or do not improve in the next few days      ED Prescriptions     Medication Sig Dispense Auth. Provider   albuterol (VENTOLIN HFA) 108 (90 Base) MCG/ACT inhaler Inhale 2 puffs into the lungs every 4 (four) hours as needed. 6.7 g Loisann Roach, DO   ipratropium (ATROVENT) 0.06 % nasal spray Place 2 sprays into both nostrils 4 (four) times daily. 15 mL Zari Cly, DO   ibuprofen (ADVIL) 600 MG tablet Take 1 tablet (600 mg total) by mouth every 6 (six) hours as needed. 30 tablet Katha Cabal, DO      PDMP not reviewed this encounter.   Katha Cabal, DO 07/17/23 1240

## 2023-09-23 ENCOUNTER — Ambulatory Visit (INDEPENDENT_AMBULATORY_CARE_PROVIDER_SITE_OTHER): Payer: Managed Care, Other (non HMO)

## 2023-09-23 VITALS — BP 113/73 | HR 88 | Ht <= 58 in | Wt 141.1 lb

## 2023-09-23 DIAGNOSIS — N912 Amenorrhea, unspecified: Secondary | ICD-10-CM

## 2023-09-23 LAB — POCT URINE PREGNANCY: Preg Test, Ur: POSITIVE — AB

## 2023-09-23 NOTE — Patient Instructions (Signed)
First Trimester of Pregnancy  The first trimester of pregnancy starts on the first day of your last menstrual period until the end of week 12. This is months 1 through 3 of pregnancy. A week after a sperm fertilizes an egg, the egg will implant into the wall of the uterus and begin to develop into a baby. By the end of 12 weeks, all the baby's organs will be formed and the baby will be 2-3 inches in size. Body changes during your first trimester Your body goes through many changes during pregnancy. The changes vary and generally return to normal after your baby is born. Physical changes You may gain or lose weight. Your breasts may begin to grow larger and become tender. The tissue that surrounds your nipples (areola) may become darker. Dark spots or blotches (chloasma or mask of pregnancy) may develop on your face. You may have changes in your hair. These can include thickening or thinning of your hair or changes in texture. Health changes You may feel nauseous, and you may vomit. You may have heartburn. You may develop headaches. You may develop constipation. Your gums may bleed and may be sensitive to brushing and flossing. Other changes You may tire easily. You may urinate more often. Your menstrual periods will stop. You may have a loss of appetite. You may develop cravings for certain kinds of food. You may have changes in your emotions from day to day. You may have more vivid and strange dreams. Follow these instructions at home: Medicines Follow your health care provider's instructions regarding medicine use. Specific medicines may be either safe or unsafe to take during pregnancy. Do not take any medicines unless told to by your health care provider. Take a prenatal vitamin that contains at least 600 micrograms (mcg) of folic acid. Eating and drinking Eat a healthy diet that includes fresh fruits and vegetables, whole grains, good sources of protein such as meat, eggs, or tofu,  and low-fat dairy products. Avoid raw meat and unpasteurized juice, milk, and cheese. These carry germs that can harm you and your baby. If you feel nauseous or you vomit: Eat 4 or 5 small meals a day instead of 3 large meals. Try eating a few soda crackers. Drink liquids between meals instead of during meals. You may need to take these actions to prevent or treat constipation: Drink enough fluid to keep your urine pale yellow. Eat foods that are high in fiber, such as beans, whole grains, and fresh fruits and vegetables. Limit foods that are high in fat and processed sugars, such as fried or sweet foods. Activity Exercise only as directed by your health care provider. Most people can continue their usual exercise routine during pregnancy. Try to exercise for 30 minutes at least 5 days a week. Stop exercising if you develop pain or cramping in the lower abdomen or lower back. Avoid exercising if it is very hot or humid or if you are at high altitude. Avoid heavy lifting. If you choose to, you may have sex unless your health care provider tells you not to. Relieving pain and discomfort Wear a good support bra to relieve breast tenderness. Rest with your legs elevated if you have leg cramps or low back pain. If you develop bulging veins (varicose veins) in your legs: Wear support hose as told by your health care provider. Elevate your feet for 15 minutes, 3-4 times a day. Limit salt in your diet. Safety Wear your seat belt at all times when   driving or riding in a car. Talk with your health care provider if someone is verbally or physically abusive to you. Talk with your health care provider if you are feeling sad or have thoughts of hurting yourself. Lifestyle Do not use hot tubs, steam rooms, or saunas. Do not douche. Do not use tampons or scented sanitary pads. Do not use herbal remedies, alcohol, illegal drugs, or medicines that are not approved by your health care provider. Chemicals  in these products can harm your baby. Do not use any products that contain nicotine or tobacco, such as cigarettes, e-cigarettes, and chewing tobacco. If you need help quitting, ask your health care provider. Avoid cat litter boxes and soil used by cats. These carry germs that can cause birth defects in the baby and possibly loss of the unborn baby (fetus) by miscarriage or stillbirth. General instructions During routine prenatal visits in the first trimester, your health care provider will do a physical exam, perform necessary tests, and ask you how things are going. Keep all follow-up visits. This is important. Ask for help if you have counseling or nutritional needs during pregnancy. Your health care provider can offer advice or refer you to specialists for help with various needs. Schedule a dentist appointment. At home, brush your teeth with a soft toothbrush. Floss gently. Write down your questions. Take them to your prenatal visits. Where to find more information American Pregnancy Association: americanpregnancy.org [employer]: https://www.todd-brady.net/ Office on [employer] Health: [employer] Contact a health care provider if you have: Dizziness. A fever. Mild pelvic cramps, pelvic pressure, or nagging pain in the abdominal area. Nausea, vomiting, or diarrhea that lasts for 24 hours or longer. A bad-smelling vaginal discharge. Pain when you urinate. Known exposure to a contagious illness, such as chickenpox, measles, Zika virus, HIV, or hepatitis. Get help right away if you have: Spotting or bleeding from your vagina. Severe abdominal cramping or pain. Shortness of breath or chest pain. Any kind of trauma, such as from a fall or a car crash. New or increased pain, swelling, or redness in an arm or leg. Summary The first trimester of pregnancy starts on the first day of your last menstrual period until the end of week  12 (months 1 through 3). Eating 4 or 5 small meals a day rather than 3 large meals may help to relieve nausea and vomiting. Do not use any products that contain nicotine or tobacco, such as cigarettes, e-cigarettes, and chewing tobacco. If you need help quitting, ask your health care provider. Keep all follow-up visits. This is important. This information is not intended to replace advice given to you by your health care provider. Make sure you discuss any questions you have with your health care provider. Document Revised: 03/29/2020 Document Reviewed: 02/03/2020 Elsevier Patient Education  2024 Elsevier Inc.  Commonly Asked Questions During Pregnancy  Cats: A parasite can be excreted in cat feces.  To avoid exposure you need to have another person empty the little box.  If you must empty the litter box you will need to wear gloves.  Wash your hands after handling your cat.  This parasite can also be found in raw or undercooked meat so this should also be avoided.  Colds, Sore Throats, Flu: Please check your medication sheet to see what you can take for symptoms.  If your symptoms are unrelieved by these medications please call the office.  Dental Work: Most any dental work Agricultural consultant recommends is permitted.  X-rays should only be taken during the first trimester if absolutely necessary.  Your abdomen should be shielded with a lead apron during all x-rays.  Please notify your provider prior to receiving any x-rays.  Novocaine is fine; gas is not recommended.  If your dentist requires a note from [place] prior to dental work please call the office and we will provide one for you.  Exercise: Exercise is an important part of staying healthy during your pregnancy.  You may continue most exercises you were accustomed to prior to pregnancy.  Later in your pregnancy you will most likely notice you have difficulty with activities requiring balance like riding a bicycle.  It is important that you listen to  your body and avoid activities that put you at a higher risk of falling.  Adequate rest and staying well hydrated are a must!  If you have questions about the safety of specific activities ask your provider.    Exposure to Children with illness: Try to avoid obvious exposure; report any symptoms to [place] when noted,  If you have chicken pos, red measles or mumps, you should be immune to these diseases.   Please do not take any vaccines while pregnant unless you have checked with your OB provider.  Fetal Movement: After 28 weeks we recommend you do "kick counts" twice daily.  Lie or sit down in a calm quiet environment and count your baby movements "kicks".  You should feel your baby at least 10 times per hour.  If you have not felt 10 kicks within the first hour get up, walk around and have something sweet to eat or drink then repeat for an additional hour.  If count remains less than 10 per hour notify your provider.  Fumigating: Follow your pest control agent's advice as to how long to stay out of your home.  Ventilate the area well before re-entering.  Hemorrhoids:   Most over-the-counter preparations can be used during pregnancy.  Check your medication to see what is safe to use.  It is important to use a stool softener or fiber in your diet and to drink lots of liquids.  If hemorrhoids seem to be getting worse please call the office.   Hot Tubs:  Hot tubs Jacuzzis and saunas are not recommended while pregnant.  These increase your internal body temperature and should be avoided.  Intercourse:  Sexual intercourse is safe during pregnancy as long as you are comfortable, unless otherwise advised by your provider.  Spotting may occur after intercourse; report any bright red bleeding that is heavier than spotting.  Labor:  If you know that you are in labor, please go to the hospital.  If you are unsure, please call the office and let us help you decide what to do.  Lifting, straining, etc:  If your job  requires heavy lifting or straining please check with your provider for any limitations.  Generally, you should not lift items heavier than that you can lift simply with your hands and arms (no back muscles)  Painting:  Paint fumes do not harm your pregnancy, but may make you ill and should be avoided if possible.  Latex or water based paints have less odor than oils.  Use adequate ventilation while painting.  Permanents & Hair Color:  Chemicals in hair dyes are not recommended as they cause increase hair dryness which can increase hair loss during pregnancy.  " Highlighting" and permanents are allowed.  Dye may be absorbed differently and permanents  may not hold as well during pregnancy.  Sunbathing:  Use a sunscreen, as skin burns easily during pregnancy.  Drink plenty of fluids; avoid over heating.  Tanning Beds:  Because their possible side effects are still unknown, tanning beds are not recommended.  Ultrasound Scans:  Routine ultrasounds are performed at approximately 20 weeks.  You will be able to see your baby's general anatomy an if you would like to know the gender this can usually be determined as well.  If it is questionable when you conceived you may also receive an ultrasound early in your pregnancy for dating purposes.  Otherwise ultrasound exams are not routinely performed unless there is a medical necessity.  Although you can request a scan we ask that you pay for it when conducted because insurance does not cover " patient request" scans.  Work: If your pregnancy proceeds without complications you may work until your due date, unless your physician or employer advises otherwise.  Round Ligament Pain/Pelvic Discomfort:  Sharp, shooting pains not associated with bleeding are fairly common, usually occurring in the second trimester of pregnancy.  They tend to be worse when standing up or when you remain standing for long periods of time.  These are the result of pressure of certain  pelvic ligaments called "round ligaments".  Rest, Tylenol and heat seem to be the most effective relief.  As the womb and fetus grow, they rise out of the pelvis and the discomfort improves.  Please notify the office if your pain seems different than that described.  It may represent a more serious condition.  Common Medications Safe in Pregnancy  Acne:      Constipation:  Benzoyl Peroxide     Colace  Clindamycin      Dulcolax Suppository  Topica Erythromycin     Fibercon  Salicylic Acid      Metamucil         Miralax AVOID:        Senakot   Accutane    Cough:  Retin-A       Cough Drops  Tetracycline      Phenergan w/ Codeine if Rx  Minocycline      Robitussin (Plain & DM)  Antibiotics:     Crabs/Lice:  Ceclor       RID  Cephalosporins    AVOID:  E-Mycins      Kwell  Keflex  Macrobid/Macrodantin   Diarrhea:  Penicillin      Kao-Pectate  Zithromax      Imodium AD         PUSH FLUIDS AVOID:       Cipro     Fever:  Tetracycline      Tylenol (Regular or Extra  Minocycline       Strength)  Levaquin      Extra Strength-Do not          Exceed 8 tabs/24 hrs Caffeine:        200mg /day (equiv. To 1 cup of coffee or  approx. 3 12 oz sodas)         Gas: Cold/Hayfever:       Gas-X  Benadryl      Mylicon  Claritin       Phazyme  **Claritin-D        Chlor-Trimeton    Headaches:  Dimetapp      ASA-Free Excedrin  Drixoral-Non-Drowsy     Cold Compress  Mucinex (Guaifenasin)     Tylenol (Regular or Extra  Sudafed/Sudafed-12 Hour  Strength)  **Sudafed PE Pseudoephedrine   Tylenol Cold & Sinus     Vicks Vapor Rub  Zyrtec  **AVOID if Problems With Blood Pressure         Heartburn: Avoid lying down for at least 1 hour after meals  Aciphex      Maalox     Rash:  Milk of Magnesia     Benadryl    Mylanta       1% Hydrocortisone Cream  Pepcid  Pepcid Complete   Sleep Aids:  Prevacid      Ambien   Prilosec       Benadryl  Rolaids       Chamomile Tea  Tums (Limit  4/day)     Unisom         Tylenol PM         Warm milk-add vanilla or  Hemorrhoids:       Sugar for taste  Anusol/Anusol H.C.  (RX: Analapram 2.5%)  Sugar Substitutes:  Hydrocortisone OTC     Ok in moderation  Preparation H      Tucks        Vaseline lotion applied to tissue with wiping    Herpes:     Throat:  Acyclovir      Oragel  Famvir  Valtrex     Vaccines:         Flu Shot Leg Cramps:       *Gardasil  Benadryl      Hepatitis A         Hepatitis B Nasal Spray:       Pneumovax  Saline Nasal Spray     Polio Booster         Tetanus Nausea:       Tuberculosis test or PPD  Vitamin B6 25 mg TID   AVOID:    Dramamine      *Gardasil  Emetrol       Live Poliovirus  Ginger Root 250 mg QID    MMR (measles, mumps &  High Complex Carbs @ Bedtime    rebella)  Sea Bands-Accupressure    Varicella (Chickenpox)  Unisom 1/2 tab TID     *No known complications           If received before Pain:         Known pregnancy;   Darvocet       Resume series after  Lortab        Delivery  Percocet    Yeast:   Tramadol      Femstat  Tylenol 3      Gyne-lotrimin  Ultram       Monistat  Vicodin           MISC:         All Sunscreens           Hair Coloring/highlights          Insect Repellant's          (Including DEET)         Mystic Tans  

## 2023-09-23 NOTE — Progress Notes (Signed)
    NURSE VISIT NOTE  Subjective:    Patient ID: Lewayne Bunting, female    DOB: [birth year], 27 y.o.   MRN: [id number]  HPI  Patient is a 27 y.o. G22P1001 female who presents for evaluation of amenorrhea. She believes she could be pregnant. Pregnancy is desired. Sexual Activity: single partner, contraception: none. Current symptoms also include: fatigue, frequent urination, morning sickness, nausea, and positive home pregnancy test. Last period was normal. She reports she has tried some OTC methods without relief.    Objective:    There were no vitals taken for this visit.  Lab Review  Results for orders placed or performed in visit on 09/23/23  POCT urine pregnancy  Result Value Ref Range   Preg Test, Ur Positive (A) Negative    Assessment:   1. Amenorrhea     Plan:   Pregnancy Test: Positive  Estimated Date of Delivery: 05/15/24 BP Cuff Measurement taken. Cuff Size Adult Encouraged well-balanced diet, plenty of rest when needed, pre-natal vitamins daily and walking for exercise.  Discussed self-help for nausea, avoiding OTC medications until consulting provider or pharmacist, other than Tylenol as needed, minimal caffeine (1-2 cups daily) and avoiding alcohol.   She will schedule her nurse visit @ 7-[redacted] wks pregnant, u/s for dating @10  wk, and NOB visit at [redacted] wk pregnant.    Feel free to call with any questions.     Rocco Serene, LPN

## 2023-10-01 ENCOUNTER — Telehealth: Payer: Medicaid Other

## 2023-10-01 VITALS — Wt 140.0 lb

## 2023-10-01 DIAGNOSIS — Z3480 Encounter for supervision of other normal pregnancy, unspecified trimester: Secondary | ICD-10-CM | POA: Insufficient documentation

## 2023-10-01 DIAGNOSIS — J4520 Mild intermittent asthma, uncomplicated: Secondary | ICD-10-CM | POA: Insufficient documentation

## 2023-10-01 DIAGNOSIS — Z3689 Encounter for other specified antenatal screening: Secondary | ICD-10-CM

## 2023-10-01 DIAGNOSIS — Z369 Encounter for antenatal screening, unspecified: Secondary | ICD-10-CM

## 2023-10-01 NOTE — Patient Instructions (Signed)
First Trimester of Pregnancy  The first trimester of pregnancy starts on the first day of your last menstrual period until the end of week 12. This is also called months 1 through 3 of pregnancy. Body changes during your first trimester Your body goes through many changes during pregnancy. The changes usually return to normal after your baby is born. Physical changes You may gain or lose weight. Your breasts may grow larger and hurt. The area around your nipples may get darker. Dark spots or blotches may develop on your face. You may have changes in your hair. Health changes You may feel like you might vomit (nauseous), and you may vomit. You may have heartburn. You may have headaches. You may have trouble pooping (constipation). Your gums may bleed. Other changes You may get tired easily. You may pee (urinate) more often. Your menstrual periods will stop. You may not feel hungry. You may want to eat certain kinds of food. You may have changes in your emotions from day to day. You may have more dreams. Follow these instructions at home: Medicines Take over-the-counter and prescription medicines only as told by your doctor. Some medicines are not safe during pregnancy. Take a prenatal vitamin that contains at least 600 micrograms (mcg) of folic acid. Eating and drinking Eat healthy meals that include: Fresh fruits and vegetables. Whole grains. Good sources of protein, such as meat, eggs, or tofu. Low-fat dairy products. Avoid raw meat and unpasteurized juice, milk, and cheese. If you feel like you may vomit, or you vomit: Eat 4 or 5 small meals a day instead of 3 large meals. Try eating a few soda crackers. Drink liquids between meals instead of during meals. You may need to take these actions to prevent or treat trouble pooping: Drink enough fluids to keep your pee (urine) pale yellow. Eat foods that are high in fiber. These include beans, whole grains, and fresh fruits and  vegetables. Limit foods that are high in fat and sugar. These include fried or sweet foods. Activity Exercise only as told by your doctor. Most people can do their usual exercise routine during pregnancy. Stop exercising if you have cramps or pain in your lower belly (abdomen) or low back. Do not exercise if it is too hot or too humid, or if you are in a place of great height (high altitude). Avoid heavy lifting. If you choose to, you may have sex unless your doctor tells you not to. Relieving pain and discomfort Wear a good support bra if your breasts are sore. Rest with your legs raised (elevated) if you have leg cramps or low back pain. If you have bulging veins (varicose veins) in your legs: Wear support hose as told by your doctor. Raise your feet for 15 minutes, 3-4 times a day. Limit salt in your food. Safety Wear your seat belt at all times when you are in a car. Talk with your doctor if someone is hurting you or yelling at you. Talk with your doctor if you are feeling sad or have thoughts of hurting yourself. Lifestyle Do not use hot tubs, steam rooms, or saunas. Do not douche. Do not use tampons or scented sanitary pads. Do not use herbal medicines, illegal drugs, or medicines that are not approved by your doctor. Do not drink alcohol. Do not smoke or use any products that contain nicotine or tobacco. If you need help quitting, ask your doctor. Avoid cat litter boxes and soil that is used by cats. These carry   germs that can cause harm to the baby and can cause a loss of your baby by miscarriage or stillbirth. General instructions Keep all follow-up visits. This is important. Ask for help if you need counseling or if you need help with nutrition. Your doctor can give you advice or tell you where to go for help. Visit your dentist. At home, brush your teeth with a soft toothbrush. Floss gently. Write down your questions. Take them to your prenatal visits. Where to find more  information American Pregnancy Association: americanpregnancy.org American College of Obstetricians and Gynecologists: www.acog.org Office on Women's Health: womenshealth.gov/pregnancy Contact a doctor if: You are dizzy. You have a fever. You have mild cramps or pressure in your lower belly. You have a nagging pain in your belly area. You continue to feel like you may vomit, you vomit, or you have watery poop (diarrhea) for 24 hours or longer. You have a bad-smelling fluid coming from your vagina. You have pain when you pee. You are exposed to a disease that spreads from person to person, such as chickenpox, measles, Zika virus, HIV, or hepatitis. Get help right away if: You have spotting or bleeding from your vagina. You have very bad belly cramping or pain. You have shortness of breath or chest pain. You have any kind of injury, such as from a fall or a car crash. You have new or increased pain, swelling, or redness in an arm or leg. Summary The first trimester of pregnancy starts on the first day of your last menstrual period until the end of week 12 (months 1 through 3). Eat 4 or 5 small meals a day instead of 3 large meals. Do not smoke or use any products that contain nicotine or tobacco. If you need help quitting, ask your doctor. Keep all follow-up visits. This information is not intended to replace advice given to you by your health care provider. Make sure you discuss any questions you have with your health care provider. Document Revised: 03/29/2020 Document Reviewed: 02/03/2020 Elsevier Patient Education  2024 [employer]. Commonly Asked Questions During Pregnancy  Cats: A parasite can be excreted in cat feces.  To avoid exposure you need to have another person empty the little box.  If you must empty the litter box you will need to wear gloves.  Wash your hands after handling your cat.  This parasite can also be found in raw or undercooked meat so this should also be  avoided.  Colds, Sore Throats, Flu: Please check your medication sheet to see what you can take for symptoms.  If your symptoms are unrelieved by these medications please call the office.  Dental Work: Most any dental work your dentist recommends is permitted.  X-rays should only be taken during the first trimester if absolutely necessary.  Your abdomen should be shielded with a lead apron during all x-rays.  Please notify your provider prior to receiving any x-rays.  Novocaine is fine; gas is not recommended.  If your dentist requires a note from us prior to dental work please call the office and we will provide one for you.  Exercise: Exercise is an important part of staying healthy during your pregnancy.  You may continue most exercises you were accustomed to prior to pregnancy.  Later in your pregnancy you will most likely notice you have difficulty with activities requiring balance like riding a bicycle.  It is important that you listen to your body and avoid activities that put you at a higher   risk of falling.  Adequate rest and staying well hydrated are a must!  If you have questions about the safety of specific activities ask your provider.    Exposure to Children with illness: Try to avoid obvious exposure; report any symptoms to us when noted,  If you have chicken pos, red measles or mumps, you should be immune to these diseases.   Please do not take any vaccines while pregnant unless you have checked with your OB provider.  Fetal Movement: After 28 weeks we recommend you do "kick counts" twice daily.  Lie or sit down in a calm quiet environment and count your baby movements "kicks".  You should feel your baby at least 10 times per hour.  If you have not felt 10 kicks within the first hour get up, walk around and have something sweet to eat or drink then repeat for an additional hour.  If count remains less than 10 per hour notify your provider.  Fumigating: Follow your pest control agent's  advice as to how long to stay out of your home.  Ventilate the area well before re-entering.  Hemorrhoids:   Most over-the-counter preparations can be used during pregnancy.  Check your medication to see what is safe to use.  It is important to use a stool softener or fiber in your diet and to drink lots of liquids.  If hemorrhoids seem to be getting worse please call the office.   Hot Tubs:  Hot tubs Jacuzzis and saunas are not recommended while pregnant.  These increase your internal body temperature and should be avoided.  Intercourse:  Sexual intercourse is safe during pregnancy as long as you are comfortable, unless otherwise advised by your provider.  Spotting may occur after intercourse; report any bright red bleeding that is heavier than spotting.  Labor:  If you know that you are in labor, please go to the hospital.  If you are unsure, please call the office and let us help you decide what to do.  Lifting, straining, etc:  If your job requires heavy lifting or straining please check with your provider for any limitations.  Generally, you should not lift items heavier than that you can lift simply with your hands and arms (no back muscles)  Painting:  Paint fumes do not harm your pregnancy, but may make you ill and should be avoided if possible.  Latex or water based paints have less odor than oils.  Use adequate ventilation while painting.  Permanents & Hair Color:  Chemicals in hair dyes are not recommended as they cause increase hair dryness which can increase hair loss during pregnancy.  " Highlighting" and permanents are allowed.  Dye may be absorbed differently and permanents may not hold as well during pregnancy.  Sunbathing:  Use a sunscreen, as skin burns easily during pregnancy.  Drink plenty of fluids; avoid over heating.  Tanning Beds:  Because their possible side effects are still unknown, tanning beds are not recommended.  Ultrasound Scans:  Routine ultrasounds are performed  at approximately 20 weeks.  You will be able to see your baby's general anatomy an if you would like to know the gender this can usually be determined as well.  If it is questionable when you conceived you may also receive an ultrasound early in your pregnancy for dating purposes.  Otherwise ultrasound exams are not routinely performed unless there is a medical necessity.  Although you can request a scan we ask that you pay for it when   conducted because insurance does not cover " patient request" scans.  Work: If your pregnancy proceeds without complications you may work until your due date, unless your physician or employer advises otherwise.  Round Ligament Pain/Pelvic Discomfort:  Sharp, shooting pains not associated with bleeding are fairly common, usually occurring in the second trimester of pregnancy.  They tend to be worse when standing up or when you remain standing for long periods of time.  These are the result of pressure of certain pelvic ligaments called "round ligaments".  Rest, Tylenol and heat seem to be the most effective relief.  As the womb and fetus grow, they rise out of the pelvis and the discomfort improves.  Please notify the office if your pain seems different than that described.  It may represent a more serious condition.  Common Medications Safe in Pregnancy  Acne:      Constipation:  Benzoyl Peroxide     Colace  Clindamycin      Dulcolax Suppository  Topica Erythromycin     Fibercon  Salicylic Acid      Metamucil         Miralax AVOID:        Senakot   Accutane    Cough:  Retin-A       Cough Drops  Tetracycline      Phenergan w/ Codeine if Rx  Minocycline      Robitussin (Plain & DM)  Antibiotics:     Crabs/Lice:  Ceclor       RID  Cephalosporins    AVOID:  E-Mycins      Kwell  Keflex  Macrobid/Macrodantin   Diarrhea:  Penicillin      Kao-Pectate  Zithromax      Imodium AD         PUSH FLUIDS AVOID:       Cipro     Fever:  Tetracycline      Tylenol (Regular  or Extra  Minocycline       Strength)  Levaquin      Extra Strength-Do not          Exceed 8 tabs/24 hrs Caffeine:        <200mg/day (equiv. To 1 cup of coffee or  approx. 3 12 oz sodas)         Gas: Cold/Hayfever:       Gas-X  Benadryl      Mylicon  Claritin       Phazyme  **Claritin-D        Chlor-Trimeton    Headaches:  Dimetapp      ASA-Free Excedrin  Drixoral-Non-Drowsy     Cold Compress  Mucinex (Guaifenasin)     Tylenol (Regular or Extra  Sudafed/Sudafed-12 Hour     Strength)  **Sudafed PE Pseudoephedrine   Tylenol Cold & Sinus     Vicks Vapor Rub  Zyrtec  **AVOID if Problems With Blood Pressure         Heartburn: Avoid lying down for at least 1 hour after meals  Aciphex      Maalox     Rash:  Milk of Magnesia     Benadryl    Mylanta       1% Hydrocortisone Cream  Pepcid  Pepcid Complete   Sleep Aids:  Prevacid      Ambien   Prilosec       Benadryl  Rolaids       Chamomile Tea  Tums (Limit 4/day)     Unisom           Tylenol PM         Warm milk-add vanilla or  Hemorrhoids:       Sugar for taste  Anusol/Anusol H.C.  (RX: Analapram 2.5%)  Sugar Substitutes:  Hydrocortisone OTC     Ok in moderation  Preparation H      Tucks        Vaseline lotion applied to tissue with wiping    Herpes:     Throat:  Acyclovir      Oragel  Famvir  Valtrex     Vaccines:         Flu Shot Leg Cramps:       *Gardasil  Benadryl      Hepatitis A         Hepatitis B Nasal Spray:       Pneumovax  Saline Nasal Spray     Polio Booster         Tetanus Nausea:       Tuberculosis test or PPD  Vitamin B6 25 mg TID   AVOID:    Dramamine      *Gardasil  Emetrol       Live Poliovirus  Ginger Root 250 mg QID    MMR (measles, mumps &  High Complex Carbs @ Bedtime    rebella)  Sea Bands-Accupressure    Varicella (Chickenpox)  Unisom 1/2 tab TID     *No known complications           If received before Pain:         Known pregnancy;   Darvocet       Resume series  after  Lortab        Delivery  Percocet    Yeast:   Tramadol      Femstat  Tylenol 3      Gyne-lotrimin  Ultram       Monistat  Vicodin           MISC:         All Sunscreens           Hair Coloring/highlights          Insect Repellant's          (Including DEET)         Mystic Tans  

## 2023-10-01 NOTE — Progress Notes (Signed)
New OB Intake  I connected with  Lori Stevenson on 10/01/23 at  1:15 PM EST by Video Visit and verified that I am speaking with the correct person using two identifiers. Nurse is located at [facility] and pt is located at home.  I explained I am completing New OB Intake today. We discussed her EDD of 05/15/2024 that is based on LMP of 08/09/2023. Pt is G2/P1001. I reviewed her allergies, medications, Medical/Surgical/OB history, and appropriate screenings. There are no cats in the home.  Based on history, this is a/an pregnancy uncomplicated . Her obstetrical history is significant for  close conception .  Patient Active Problem List   Diagnosis Date Noted   Intermittent asthma 10/01/2023   Supervision of other normal pregnancy, antepartum 10/01/2023   History of asthma 03/13/2022   Rh negative state in antepartum period 03/13/2022    Concerns addressed today None  Delivery Plans:  Plans to deliver at [facility].  Anatomy US Explained first scheduled [place] will be Dec 11th and an anatomy scan will be done at 20 weeks.  Labs Discussed genetic screening with patient. Patient declines genetic testing to be drawn at new OB visit. Discussed possible labs to be drawn at new OB appointment.  COVID Vaccine Patient has not had COVID vaccine.   Social Determinants of Health Food Insecurity: denies food insecurity Transportation: Patient denies transportation needs. Childcare: Discussed no children allowed at ultrasound appointments.   First visit review I reviewed new OB appt with pt. I explained she will have ob bloodwork and pap smear/pelvic exam if indicated. Explained pt will be seen by [name], CNM at first visit; encounter routed to appropriate provider.   Loran Senters, [place] 10/01/2023  1:46 PM

## 2023-10-15 ENCOUNTER — Telehealth: Payer: Self-pay

## 2023-10-15 ENCOUNTER — Ambulatory Visit (INDEPENDENT_AMBULATORY_CARE_PROVIDER_SITE_OTHER): Payer: Managed Care, Other (non HMO)

## 2023-10-15 ENCOUNTER — Other Ambulatory Visit: Payer: Managed Care, Other (non HMO)

## 2023-10-15 DIAGNOSIS — O219 Vomiting of pregnancy, unspecified: Secondary | ICD-10-CM

## 2023-10-15 DIAGNOSIS — Z3687 Encounter for antenatal screening for uncertain dates: Secondary | ICD-10-CM | POA: Diagnosis not present

## 2023-10-15 DIAGNOSIS — Z3480 Encounter for supervision of other normal pregnancy, unspecified trimester: Secondary | ICD-10-CM

## 2023-10-15 DIAGNOSIS — Z369 Encounter for antenatal screening, unspecified: Secondary | ICD-10-CM

## 2023-10-15 DIAGNOSIS — Z3A09 9 weeks gestation of pregnancy: Secondary | ICD-10-CM | POA: Diagnosis not present

## 2023-10-15 MED ORDER — ONDANSETRON 4 MG PO TBDP
4.0000 mg | ORAL_TABLET | Freq: Three times a day (TID) | ORAL | 0 refills | Status: DC | PRN
Start: 2023-10-15 — End: 2023-11-04

## 2023-10-15 NOTE — Telephone Encounter (Signed)
Pt called triage and is asking if a nausea Rx can be sent. She has really bad nausea that when she is vomiting she poops at the same time and sweats a lot. Has tried ginger gummies and saltine crackers. Has not tried anything else because she is throwing up everything, including the ginger gummies (and those work good for her usually). Do you approve sending a nausea Rx? If so, which one?

## 2023-10-15 NOTE — Telephone Encounter (Signed)
Rx sent per [facility].

## 2023-10-15 NOTE — Telephone Encounter (Signed)
Pt aware.

## 2023-10-20 ENCOUNTER — Ambulatory Visit (INDEPENDENT_AMBULATORY_CARE_PROVIDER_SITE_OTHER): Payer: Managed Care, Other (non HMO)

## 2023-10-20 VITALS — BP 99/69 | HR 88 | Ht <= 58 in | Wt 134.0 lb

## 2023-10-20 DIAGNOSIS — R300 Dysuria: Secondary | ICD-10-CM | POA: Diagnosis not present

## 2023-10-20 DIAGNOSIS — Z3A10 10 weeks gestation of pregnancy: Secondary | ICD-10-CM

## 2023-10-20 LAB — POCT URINALYSIS DIPSTICK OB
Bilirubin, UA: NEGATIVE
Glucose, UA: NEGATIVE
Ketones, UA: NEGATIVE
Nitrite, UA: NEGATIVE
Spec Grav, UA: 1.02 (ref 1.010–1.025)
Urobilinogen, UA: 0.2 U/dL
pH, UA: 7 (ref 5.0–8.0)

## 2023-10-20 MED ORDER — NITROFURANTOIN MONOHYD MACRO 100 MG PO CAPS: 100.0000 mg | ORAL_CAPSULE | Freq: Two times a day (BID) | ORAL | 0 refills | Status: AC

## 2023-10-20 NOTE — Progress Notes (Signed)
    NURSE VISIT NOTE  Subjective:    Patient ID: Lori Stevenson, female    DOB: 05-30-1996, 27 y.o.   MRN: [id number]       HPI Patient is a 27 y.o. G82P1001 female who presents for dysuria for 1 week.  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     Objective:    BP 99/69   Pulse 88   Wt 134 lb (60.8 kg)   LMP 08/09/2023 (Exact Date)   BMI 28.01 kg/m    Lab Review  Results for orders placed or performed in visit on 10/20/23  POC Urinalysis Dipstick OB  Result Value Ref Range   Color, UA     Clarity, UA     Glucose, UA Negative Negative   Bilirubin, UA Negative    Ketones, UA Negative    Spec Grav, UA 1.020 1.010 - 1.025   Blood, UA trace    pH, UA 7.0 5.0 - 8.0   POC,PROTEIN,UA Small (1+) Negative, Trace, Small (1+), Moderate (2+), Large (3+), 4+   Urobilinogen, UA 0.2 0.2 or 1.0 E.U./dL   Nitrite, UA Negative    Leukocytes, UA Moderate (2+) (A) Negative   Appearance     Odor      Assessment:   1. Dysuria   2. [redacted] weeks gestation of pregnancy      Plan:   Urine Culture Sent. Treatment  Macrobid 100 mg PO BID for 7 days. Maintain adequate hydration.  Follow up if symptoms worsen or fail to improve as anticipated, and as needed.    Loney Laurence, CMA

## 2023-10-22 LAB — URINE CULTURE

## 2023-11-01 ENCOUNTER — Encounter: Payer: Self-pay | Admitting: Emergency Medicine

## 2023-11-01 ENCOUNTER — Ambulatory Visit
Admission: EM | Admit: 2023-11-01 | Discharge: 2023-11-01 | Disposition: A | Discharge status: Home / Self Care | Payer: Managed Care, Other (non HMO) | Attending: Family Medicine | Admitting: Family Medicine

## 2023-11-01 DIAGNOSIS — J069 Acute upper respiratory infection, unspecified: Secondary | ICD-10-CM | POA: Insufficient documentation

## 2023-11-01 LAB — GROUP A STREP BY PCR: Group A Strep by PCR: NOT DETECTED

## 2023-11-01 LAB — RESP PANEL BY RT-PCR (FLU A&B, COVID) ARPGX2
Influenza A by PCR: NEGATIVE
Influenza B by PCR: NEGATIVE
SARS Coronavirus 2 by RT PCR: NEGATIVE

## 2023-11-01 MED ORDER — ALBUTEROL SULFATE (2.5 MG/3ML) 0.083% IN NEBU: 2.5000 mg | INHALATION_SOLUTION | Freq: Four times a day (QID) | RESPIRATORY_TRACT | 12 refills | Status: DC | PRN

## 2023-11-01 NOTE — ED Triage Notes (Signed)
Patient c/o cough, congestion, sore throat, and sinus pressure that started 2 days ago. Patient reports fever yesterday.  Patient is currently pregnant.

## 2023-11-01 NOTE — ED Provider Notes (Signed)
[facility]    CSN: [id number] Arrival date & time: 11/01/23  5621      History   Chief Complaint Chief Complaint  Patient presents with   Cough   Sore Throat    HPI Lori Stevenson is a 27 y.o. female.   HPI  History obtained from the patient. Lori Stevenson presents for cough and nasal congestion that started 2 days ago. Yesterday, she had sore throat bodyaches, chills and fever.  Tmax 101 F. She has asthma and has intermittent chest pains and difficulty breathing. Has been using her inhaler and helps a little. Ran out of nebulizer solution. No vomiting or diarrhea.  Her son is sick too but hasn't had a fever.  Took Robitussin.  She is [redacted] weeks pregnant. She works at [employer] center and missed work today.    Past Medical History:  Diagnosis Date   Asthma    Encounter for supervision of normal first pregnancy in third trimester 06/18/2022              Clinical Staff    Provider      Office Location     [facility]    Dating     LMP 08/04/2021      Language     English    Anatomy US     Normal      Flu Vaccine          Genetic Screen     NIPS: Declined      TDaP vaccine      Given 05/03/22    Hgb A1C or   GTT    Early :  Third trimester : 120      Covid              LAB RESULTS       Rhogam     Given 05/23/22    Blood Type    O/Negat    Patient Active Problem List   Diagnosis Date Noted   Intermittent asthma 10/01/2023   Supervision of other normal pregnancy, antepartum 10/01/2023   History of asthma 03/13/2022   Rh negative state in antepartum period 03/13/2022    Past Surgical History:  Procedure Laterality Date   NO PAST SURGERIES     WISDOM TOOTH EXTRACTION     four;  age 23    OB History     Gravida  2   Para  1   Term  1   Preterm      AB      Living  1      SAB      IAB      Ectopic      Multiple  0   Live Births  1            Home Medications    Prior to Admission medications   Medication Sig Start Date End Date  Taking? Authorizing Provider  albuterol (PROVENTIL) (2.5 MG/3ML) 0.083% nebulizer solution Take 3 mLs (2.5 mg total) by nebulization every 6 (six) hours as needed for wheezing or shortness of breath. 11/01/23  Yes Abdulai Blaylock, DO  albuterol (VENTOLIN HFA) 108 (90 Base) MCG/ACT inhaler Inhale 2 puffs into the lungs every 4 (four) hours as needed. 07/17/23   Tyquez Hollibaugh, Seward Meth, DO  Ginger 500 MG CAPS Take 1 capsule by mouth daily.    [provider]  ipratropium (ATROVENT) 0.06 % nasal spray Place 2 sprays into both nostrils 4 (four) times daily.  Patient not taking: Reported on 10/01/2023 07/17/23   Katha Cabal, DO  Levonorgestrel-Ethinyl Estradiol (AMETHIA) 0.15-0.03 &0.01 MG tablet Take 1 tablet by mouth daily. Patient not taking: Reported on 10/01/2023 09/03/22   Glenetta Borg, CNM  ondansetron (ZOFRAN-ODT) 4 MG disintegrating tablet Take 1 tablet (4 mg total) by mouth every 8 (eight) hours as needed for nausea or vomiting. 10/15/23   Mirna Mires, CNM  Prenatal Vit-Fe Fumarate-FA (PRENATAL PO) Take by mouth.    [provider]  pyridOXINE (VITAMIN B6) 25 MG tablet Take 25 mg by mouth daily.    [provider]  omeprazole (PRILOSEC OTC) 20 MG tablet Take 1 tablet (20 mg total) by mouth daily. 02/13/17 12/22/19  Loleta Rose, MD  promethazine (PHENERGAN) 25 MG tablet Take 25 mg by mouth every 6 (six) hours as needed for nausea or vomiting.  12/22/19  [provider]  sucralfate (CARAFATE) 1 g tablet Take 1 tablet (1 g total) by mouth 4 (four) times daily as needed (for abdominal discomfort, nausea, and/or vomiting). 02/13/17 12/22/19  Loleta Rose, MD    Family History Family History  Problem Relation Age of Onset   Thyroid disease Mother    Healthy Father    Healthy Maternal Grandmother    Healthy Maternal Grandfather    Healthy Paternal Grandmother    Cancer Paternal Grandfather 60       lung   Healthy Half-Sister    Healthy Half-Sister     Healthy Half-Sister    Asthma Half-Brother    Breast cancer Neg Hx    Colon cancer Neg Hx     Social History Social History   Tobacco Use   Smoking status: Former    Types: Cigarettes    Passive exposure: Never   Smokeless tobacco: Never  Vaping Use   Vaping status: Never Used  Substance Use Topics   Alcohol use: No   Drug use: No     Allergies   Patient has no known allergies.   Review of Systems Review of Systems: negative unless otherwise stated in HPI.      Physical Exam Triage Vital Signs ED Triage Vitals  Encounter Vitals Group     BP 11/01/23 0834 107/76     Systolic BP Percentile --      Diastolic BP Percentile --      Pulse Rate 11/01/23 0834 79     Resp 11/01/23 0834 14     Temp 11/01/23 0834 98.8 F (37.1 C)     Temp Source 11/01/23 0834 Oral     SpO2 11/01/23 0834 100 %     Weight 11/01/23 0833 145 lb (65.8 kg)     Height 11/01/23 0833 4\' 10"  (1.473 m)     Head Circumference --      Peak Flow --      Pain Score 11/01/23 0833 8     Pain Loc --      Pain Education --      Exclude from Growth Chart --    No data found.  Updated Vital Signs BP 107/76 (BP Location: Left Arm)   Pulse 79   Temp 98.8 F (37.1 C) (Oral)   Resp 14   Ht 4\' 10"  (1.473 m)   Wt 65.8 kg   LMP 08/09/2023 (Exact Date)   SpO2 100%   BMI 30.31 kg/m   Visual Acuity Right Eye Distance:   Left Eye Distance:   Bilateral Distance:    Right Eye Near:  Left Eye Near:    Bilateral Near:     Physical Exam GEN:     alert, non-toxic appearing female in no distress    HENT:  mucus membranes moist, oropharyngeal without lesions, mild erythema, no tonsillar hypertrophy or exudates, clear nasal discharge, bilateral TM normal though scarring on the left  EYES:   pupils equal and reactive, no scleral injection or discharge NECK:  normal ROM, no meningismus   RESP:  no increased work of breathing, clear to auscultation bilaterally CVS:   regular rate and rhythm Skin:   warm  and dry    UC Treatments / Results  Labs (all labs ordered are listed, but only abnormal results are displayed) Labs Reviewed  GROUP A STREP BY PCR  RESP PANEL BY RT-PCR (FLU A&B, COVID) [id number]    EKG   Radiology No results found.  Procedures Procedures (including critical care time)  Medications Ordered in UC Medications - No data to display  Initial Impression / Assessment and Plan / UC Course  I have reviewed the triage vital signs and the nursing notes.  Pertinent labs & imaging results that were available during my care of the patient were reviewed by me and considered in my medical decision making (see chart for details).       Pt is a 27 y.o. female who presents for 2 days of respiratory symptoms. Advitha is afebrile here without recent antipyretics. Satting well on room air. Overall pt is non-toxic appearing, well hydrated, without respiratory distress. Pulmonary exam is unremarkable.  COVID and influenza panel is negative. Strep testing obtained and was negative.  History consistent with viral respiratory illness. Discussed symptomatic treatment.  Pregnancy safe handout provided.  Explained lack of efficacy of antibiotics in viral disease.  Typical duration of symptoms discussed.  Work note given.  Refilled albuterol nebulizer solution.   Return and ED precautions given and voiced understanding. Discussed MDM, treatment plan and plan for follow-up with patient who agrees with plan.     Final Clinical Impressions(s) / UC Diagnoses   Final diagnoses:  Viral URI with cough     Discharge Instructions      Stop by the pharmacy to pick up your prescriptions.  Follow up with your primary care provider as needed. See pregnancy safe handout for over-the-counter treatments.       ED Prescriptions     Medication Sig Dispense Auth. Provider   albuterol (PROVENTIL) (2.5 MG/3ML) 0.083% nebulizer solution Take 3 mLs (2.5 mg total) by nebulization every 6 (six)  hours as needed for wheezing or shortness of breath. 75 mL Katha Cabal, DO      PDMP not reviewed this encounter.   Katha Cabal, DO 11/01/23 0930

## 2023-11-01 NOTE — Discharge Instructions (Signed)
Stop by the pharmacy to pick up your prescriptions.  Follow up with your primary care provider as needed. See pregnancy safe handout for over-the-counter treatments.

## 2023-11-04 ENCOUNTER — Other Ambulatory Visit (HOSPITAL_COMMUNITY)
Admission: RE | Admit: 2023-11-04 | Discharge: 2023-11-04 | Disposition: A | Discharge status: Home / Self Care | Payer: Managed Care, Other (non HMO) | Source: Ambulatory Visit | Attending: Obstetrics | Admitting: Obstetrics

## 2023-11-04 ENCOUNTER — Encounter: Payer: Self-pay | Admitting: Obstetrics

## 2023-11-04 ENCOUNTER — Ambulatory Visit (INDEPENDENT_AMBULATORY_CARE_PROVIDER_SITE_OTHER): Payer: Managed Care, Other (non HMO) | Admitting: Obstetrics

## 2023-11-04 VITALS — BP 106/71 | HR 71 | Wt 131.0 lb

## 2023-11-04 DIAGNOSIS — Z3A12 12 weeks gestation of pregnancy: Secondary | ICD-10-CM | POA: Diagnosis not present

## 2023-11-04 DIAGNOSIS — Z113 Encounter for screening for infections with a predominantly sexual mode of transmission: Secondary | ICD-10-CM | POA: Insufficient documentation

## 2023-11-04 DIAGNOSIS — Z3481 Encounter for supervision of other normal pregnancy, first trimester: Secondary | ICD-10-CM | POA: Diagnosis not present

## 2023-11-04 DIAGNOSIS — Z3480 Encounter for supervision of other normal pregnancy, unspecified trimester: Secondary | ICD-10-CM

## 2023-11-04 DIAGNOSIS — Z6791 Unspecified blood type, Rh negative: Secondary | ICD-10-CM

## 2023-11-04 DIAGNOSIS — Z8709 Personal history of other diseases of the respiratory system: Secondary | ICD-10-CM

## 2023-11-04 DIAGNOSIS — O219 Vomiting of pregnancy, unspecified: Secondary | ICD-10-CM

## 2023-11-04 MED ORDER — ONDANSETRON 4 MG PO TBDP
4.0000 mg | ORAL_TABLET | Freq: Three times a day (TID) | ORAL | 2 refills | Status: DC | PRN
Start: 2023-11-04 — End: 2024-04-06

## 2023-11-04 NOTE — Progress Notes (Signed)
 NEW OB HISTORY AND PHYSICAL  SUBJECTIVE:       Lori Stevenson is a 27 y.o. G55P1001 female, Patient's last menstrual period was 08/09/2023 (exact date)., Estimated Date of Delivery: 05/15/24, [redacted]w[redacted]d, presents today for establishment of Prenatal Care. She reports nausea, vomiting, constipation, cramping, and achy legs.   Social history Partner/Relationship: FOB involved Living situation: lives with partner and son Work: biochemist, clinical - light duty Substance use: Quit smoking with +HPT. Denies other substance use.   Gynecologic History Patient's last menstrual period was 08/09/2023 (exact date). Normal Contraception: none Last Pap: 02/14/2022. Results were: normal  Obstetric History OB History  Gravida Para Term Preterm AB Living  2 1 1   1   SAB IAB Ectopic Multiple Live Births     0 1    # Outcome Date GA Lbr Len/2nd Weight Sex Type Anes PTL Lv  2 Current           1 Term 07/26/22 [redacted]w[redacted]d / 02:53 6 lb 3.5 oz (2.82 kg) M Vag-Vacuum EPI, Local  LIV    Past Medical History:  Diagnosis Date   Asthma    Encounter for supervision of normal first pregnancy in third trimester 06/18/2022              Clinical Staff    Provider      Office Location     [facility]    Dating     LMP 08/04/2021      Language     English    Anatomy US      Normal      Flu Vaccine          Genetic Screen     NIPS: Declined      TDaP vaccine      Given 05/03/22    Hgb A1C or   GTT    Early :  Third trimester : 120      Covid              LAB RESULTS       Rhogam     Given 05/23/22    Blood Type    O/Negat    Past Surgical History:  Procedure Laterality Date   NO PAST SURGERIES     WISDOM TOOTH EXTRACTION     four;  age 85    Current Outpatient Medications on File Prior to Visit  Medication Sig Dispense Refill   albuterol  (PROVENTIL ) (2.5 MG/3ML) 0.083% nebulizer solution Take 3 mLs (2.5 mg total) by nebulization every 6 (six) hours as needed for wheezing or shortness of breath. 75 mL 12   albuterol   (VENTOLIN  HFA) 108 (90 Base) MCG/ACT inhaler Inhale 2 puffs into the lungs every 4 (four) hours as needed. 6.7 g 2   Ginger 500 MG CAPS Take 1 capsule by mouth daily.     ipratropium (ATROVENT ) 0.06 % nasal spray Place 2 sprays into both nostrils 4 (four) times daily. (Patient not taking: Reported on 10/01/2023) 15 mL 0   Levonorgestrel -Ethinyl Estradiol (AMETHIA) 0.15-0.03 &0.01 MG tablet Take 1 tablet by mouth daily. (Patient not taking: Reported on 10/01/2023) 91 tablet 4   ondansetron  (ZOFRAN -ODT) 4 MG disintegrating tablet Take 1 tablet (4 mg total) by mouth every 8 (eight) hours as needed for nausea or vomiting. 20 tablet 0   Prenatal Vit-Fe Fumarate-FA (PRENATAL PO) Take by mouth.     pyridOXINE (VITAMIN B6) 25 MG tablet Take 25 mg by mouth daily.     [DISCONTINUED] omeprazole  (PRILOSEC   OTC) 20 MG tablet Take 1 tablet (20 mg total) by mouth daily. 28 tablet 1   [DISCONTINUED] promethazine  (PHENERGAN ) 25 MG tablet Take 25 mg by mouth every 6 (six) hours as needed for nausea or vomiting.     [DISCONTINUED] sucralfate  (CARAFATE ) 1 g tablet Take 1 tablet (1 g total) by mouth 4 (four) times daily as needed (for abdominal discomfort, nausea, and/or vomiting). 30 tablet 1   No current facility-administered medications on file prior to visit.    No Known Allergies  Social History   Socioeconomic History   Marital status: Married    Spouse name: Curtistine   Number of children: 1   Years of education: 14   Highest education level: Not on file  Occupational History   Occupation: biochemist, clinical at [employer]  Tobacco Use   Smoking status: Former    Types: Cigarettes    Passive exposure: Never   Smokeless tobacco: Never  Vaping Use   Vaping status: Never Used  Substance and Sexual Activity   Alcohol use: No   Drug use: No   Sexual activity: Yes    Partners: Male    Birth control/protection: None    Comment: undecided  Other Topics Concern   Not on file  Social  History Narrative   Right handed   One story home   Drinks caffeineq   Social Drivers of Health   Financial Resource Strain: Low Risk  (10/01/2023)   Overall Financial Resource Strain (CARDIA)    Difficulty of Paying Living Expenses: Not very hard  Food Insecurity: No Food Insecurity (10/01/2023)   Hunger Vital Sign    Worried About Running Out of Food in the Last Year: Never true    Ran Out of Food in the Last Year: Never true  Transportation Needs: No Transportation Needs (10/01/2023)   PRAPARE - Administrator, Civil Service (Medical): No    Lack of Transportation (Non-Medical): No  Physical Activity: Insufficiently Active (10/01/2023)   Exercise Vital Sign    Days of Exercise per Week: 2 days    Minutes of Exercise per Session: 60 min  Stress: No Stress Concern Present (10/01/2023)   [employer] of Occupational Health - Occupational Stress Questionnaire    Feeling of Stress : Not at all  Social Connections: Moderately Integrated (10/01/2023)   Social Connection and Isolation Panel [NHANES]    Frequency of Communication with Friends and Family: More than three times a week    Frequency of Social Gatherings with Friends and Family: Once a week    Attends Religious Services: 1 to 4 times per year    Active Member of [employer] or Organizations: No    Attends Banker Meetings: Never    Marital Status: Married  Catering Manager Violence: Not At Risk (10/01/2023)   Humiliation, Afraid, Rape, and Kick questionnaire    Fear of Current or Ex-Partner: No    Emotionally Abused: No    Physically Abused: No    Sexually Abused: No    Family History  Problem Relation Age of Onset   Thyroid disease Mother    Healthy Father    Healthy Maternal Grandmother    Healthy Maternal Grandfather    Healthy Paternal Grandmother    Cancer Paternal Grandfather 14       lung   Healthy Half-Sister    Healthy Half-Sister    Healthy Half-Sister    Asthma Half-Brother     Breast cancer Neg Hx  Colon cancer Neg Hx     The following portions of the patient's history were reviewed and updated as appropriate: allergies, current medications, past OB history, past medical history, past surgical history, past family history, past social history, and problem list.  Constitutional: Denied constitutional symptoms, night sweats, recent illness, fatigue, fever, insomnia and weight loss.  Eyes: Denied eye symptoms, eye pain, photophobia, vision change and visual disturbance.  Ears/Nose/Throat/Neck: Denied ear, nose, throat or neck symptoms, hearing loss, nasal discharge, sinus congestion and sore throat.  Cardiovascular: Denied cardiovascular symptoms, arrhythmia, chest pain/pressure, edema, exercise intolerance, orthopnea and palpitations.  Respiratory: Denied pulmonary symptoms, asthma, pleuritic pain, productive sputum, cough, dyspnea and wheezing.  Gastrointestinal: Nausea, vomiting, constipation  Genitourinary: Denied genitourinary symptoms including symptomatic vaginal discharge, pelvic relaxation issues, and urinary complaints.  Musculoskeletal: Denied musculoskeletal symptoms, stiffness, swelling, muscle weakness and myalgia.  Dermatologic: Denied dermatology symptoms, rash and scar.  Neurologic: Denied neurology symptoms, dizziness, headache, neck pain and syncope.  Psychiatric: Denied psychiatric symptoms, anxiety and depression.  Endocrine: Denied endocrine symptoms including hot flashes and night sweats.    Indications for ASA therapy (per uptodate) One of the following: Previous pregnancy with preeclampsia, especially early onset and with an adverse outcome No Multifetal gestation No Chronic hypertension No Type 1 or 2 diabetes mellitus No Chronic kidney disease No Autoimmune disease (antiphospholipid syndrome, systemic lupus erythematosus) No  Two or more of the following: Nulliparity No Obesity (body mass index >30 kg/m2) No Family history of  preeclampsia in mother or sister No Age >=35 years No Sociodemographic characteristics (African American race, low socioeconomic level) No Personal risk factors (eg, previous pregnancy with low birth weight or small for gestational age infant, previous adverse pregnancy outcome [eg, stillbirth], interval >10 years between pregnancies) No   OBJECTIVE: Initial Physical Exam (New OB)  GENERAL APPEARANCE: alert, well appearing HEAD: normocephalic, atraumatic MOUTH: mucous membranes moist, pharynx normal without lesions THYROID: no thyromegaly or masses present BREASTS: no masses noted, no significant tenderness, no palpable axillary nodes, no skin changes LUNGS: clear to auscultation, no wheezes, rales or rhonchi, symmetric air entry HEART: regular rate and rhythm, no murmurs ABDOMEN: soft, nontender, nondistended, no abnormal masses, no epigastric pain, FHT present, and fundus soft, non-tender, 2 FB above SP EXTREMITIES: no redness or tenderness in the calves or thighs SKIN: normal coloration and turgor, no rashes LYMPH NODES: no adenopathy palpable NEUROLOGIC: alert, oriented, normal speech, no focal findings or movement disorder noted  PELVIC EXAM declined  ASSESSMENT: Normal pregnancy [redacted]w[redacted]d   PLAN: Routine prenatal care. We discussed an overview of prenatal care and when to call. Reviewed diet, exercise, and weight gain recommendations in pregnancy. Discussed benefits of breastfeeding and lactation resources at [facility]. NOB labs and genetic screening collected today. Rx sent for Zofran .  See orders  Eleanor Canny, CNM

## 2023-11-05 LAB — MICROSCOPIC EXAMINATION
Casts: NONE SEEN /[LPF]
Epithelial Cells (non renal): >10 /[HPF] — AB (ref 0–10)
RBC, Urine: NONE SEEN /[HPF] (ref 0–2)

## 2023-11-05 LAB — CBC/D/PLT+RPR+RH+ABO+RUBIGG...
Antibody Screen: NEGATIVE
Basophils Absolute: 0 10*3/uL (ref 0.0–0.2)
Basos: 1 %
EOS (ABSOLUTE): 0.1 10*3/uL (ref 0.0–0.4)
Eos: 2 %
HCV Ab: NONREACTIVE
HIV Screen 4th Generation wRfx: NONREACTIVE
Hematocrit: 39.7 % (ref 34.0–46.6)
Hemoglobin: 12.9 g/dL (ref 11.1–15.9)
Hepatitis B Surface Ag: NEGATIVE
Immature Grans (Abs): 0 10*3/uL (ref 0.0–0.1)
Immature Granulocytes: 0 %
Lymphocytes Absolute: 2 10*3/uL (ref 0.7–3.1)
Lymphs: 39 %
MCH: 28.4 pg (ref 26.6–33.0)
MCHC: 32.5 g/dL (ref 31.5–35.7)
MCV: 87 fL (ref 79–97)
Monocytes Absolute: 0.3 10*3/uL (ref 0.1–0.9)
Monocytes: 5 %
Neutrophils Absolute: 2.7 10*3/uL (ref 1.4–7.0)
Neutrophils: 53 %
Platelets: 281 10*3/uL (ref 150–450)
RBC: 4.55 x10E6/uL (ref 3.77–5.28)
RDW: 12.1 % (ref 11.7–15.4)
RPR Ser Ql: NONREACTIVE
Rh Factor: NEGATIVE
Rubella Antibodies, IGG: 1.95 {index} (ref >0.99)
Varicella zoster IgG: REACTIVE
WBC: 5.1 10*3/uL (ref 3.4–10.8)

## 2023-11-05 LAB — URINALYSIS, ROUTINE W REFLEX MICROSCOPIC
Bilirubin, UA: NEGATIVE
Glucose, UA: NEGATIVE
Ketones, UA: NEGATIVE
Nitrite, UA: NEGATIVE
RBC, UA: NEGATIVE
Specific Gravity, UA: 1.023 (ref 1.005–1.030)
Urobilinogen, Ur: 0.2 mg/dL (ref 0.2–1.0)
pH, UA: 7 (ref 5.0–7.5)

## 2023-11-05 LAB — HCV INTERPRETATION

## 2023-11-05 NOTE — L&D Delivery Note (Signed)
 Delivery Note   [name] is a 28 y.o. G2P1001 at [redacted]w[redacted]d Estimated Date of Delivery: 05/15/24  PRE-OPERATIVE DIAGNOSIS:  1) [redacted]w[redacted]d pregnancy.  2) Asthma  POST-OPERATIVE DIAGNOSIS:  1) [redacted]w[redacted]d pregnancy s/p Vaginal, Spontaneous 2) Laceration-repaired  Delivery Type: Vaginal, Spontaneous   Delivery Anesthesia: Local  Labor Complications:  meconium stained fluid    ESTIMATED BLOOD LOSS: 300 ml    FINDINGS:   1) female infant, Apgar scores of 8   at 1 minute and 9   at 5 minutes and a pending birthweight.    SPECIMENS:   PLACENTA:   Appearance: Intact   Removal: Spontaneous     Disposition:  discarded  CORD BLOOD: lab  DISPOSITION:  Infant left in stable condition in the delivery room, with L&D personnel and mother,  NARRATIVE SUMMARY: Labor course:  Lori Stevenson is a G2P1001 at [redacted]w[redacted]d who presented to Labor & Delivery for labor management. Her initial cervical exam was 7/100. Labor proceeded spontaneously and she was found to be completely dilated at 0820. While pushing, initial fetal descent was slow and given her history of a previous vacuum assisted delivery, deep FHR variables, and MSF, Dr. Leigh was notified of possible need for assistance. However, patient began pushing with good effort and she birthed a viable female infant at 99 without additional assistance. There was not a nuchal cord. The shoulders were birthed without difficulty. The infant was placed skin-to-skin with mother. The cord was doubly clamped and cut when pulsations ceased. Patient lost IV access while pushing and was given 10 U of IM pitocin . The placenta delivered spontaneously and was noted to be intact with a 3VC. A perineal and vaginal examination was performed. Episiotomy/Lacerations: 2nd degree Lacerations were repaired with Vicryl suture using local anesthesia. The patient tolerated this well. Mother and baby were left in stable condition.  Dr. Leigh was notified of the birth and was  immediately available for the care of this patient.   Lauraine PARAS Tyanne Derocher, CNM 05/01/2024 9:21 AM

## 2023-11-06 LAB — CERVICOVAGINAL ANCILLARY ONLY
Chlamydia: NEGATIVE
Comment: NEGATIVE
Comment: NEGATIVE
Comment: NORMAL
Neisseria Gonorrhea: NEGATIVE
Trichomonas: NEGATIVE

## 2023-11-06 LAB — URINE CULTURE, OB REFLEX

## 2023-11-06 LAB — CULTURE, OB URINE

## 2023-11-07 LAB — MONITOR DRUG PROFILE 14(MW)
Amphetamine Scrn, Ur: NEGATIVE ng/mL
BARBITURATE SCREEN URINE: NEGATIVE ng/mL
BENZODIAZEPINE SCREEN, URINE: NEGATIVE ng/mL
Buprenorphine, Urine: NEGATIVE ng/mL
CANNABINOIDS UR QL SCN: NEGATIVE ng/mL
Cocaine (Metab) Scrn, Ur: NEGATIVE ng/mL
Creatinine(Crt), U: 197.9 mg/dL (ref 20.0–300.0)
Fentanyl, Urine: NEGATIVE pg/mL
Meperidine Screen, Urine: NEGATIVE ng/mL
Methadone Screen, Urine: NEGATIVE ng/mL
OXYCODONE+OXYMORPHONE UR QL SCN: NEGATIVE ng/mL
Opiate Scrn, Ur: NEGATIVE ng/mL
Ph of Urine: 6.7 (ref 4.5–8.9)
Phencyclidine Qn, Ur: NEGATIVE ng/mL
Propoxyphene Scrn, Ur: NEGATIVE ng/mL
SPECIFIC GRAVITY: 1.022
Tramadol Screen, Urine: NEGATIVE ng/mL

## 2023-11-07 LAB — NICOTINE SCREEN, URINE: Cotinine Ql Scrn, Ur: POSITIVE ng/mL — AB

## 2023-11-12 LAB — MATERNIT 21 PLUS CORE, BLOOD
Fetal Fraction: 23
Result (T21): NEGATIVE
Trisomy 13 (Patau syndrome): NEGATIVE
Trisomy 18 (Edwards syndrome): NEGATIVE
Trisomy 21 (Down syndrome): NEGATIVE

## 2023-11-25 ENCOUNTER — Telehealth: Payer: Self-pay

## 2023-11-25 NOTE — Telephone Encounter (Signed)
Pt left msg on triage asking for advise with a headache she developed yesterday, has never happened. Headache info sent via mychart.

## 2023-11-25 NOTE — Telephone Encounter (Signed)
Taking care of this in separate encounter.

## 2023-11-27 ENCOUNTER — Encounter: Payer: Self-pay | Admitting: Obstetrics

## 2023-12-02 ENCOUNTER — Encounter: Payer: Self-pay | Admitting: Certified Nurse Midwife

## 2023-12-02 ENCOUNTER — Ambulatory Visit (INDEPENDENT_AMBULATORY_CARE_PROVIDER_SITE_OTHER): Payer: Managed Care, Other (non HMO) | Admitting: Certified Nurse Midwife

## 2023-12-02 VITALS — BP 110/73 | HR 94 | Wt 135.0 lb

## 2023-12-02 DIAGNOSIS — Z3A16 16 weeks gestation of pregnancy: Secondary | ICD-10-CM

## 2023-12-02 NOTE — Progress Notes (Signed)
Rob doing well, feeling some fluttering. She is having some round ligament pain, reassurance given. Self help measures reviewed. She has also had migraine headache, related to recent upper respiratory infection and ear pain. Discussed self help measures. Reviewed anatomy u/s next visit. She is in agreement. Follow up 4 wks.   Doreene Burke, CNM

## 2023-12-29 ENCOUNTER — Other Ambulatory Visit: Payer: Self-pay | Admitting: Certified Nurse Midwife

## 2023-12-29 DIAGNOSIS — Z363 Encounter for antenatal screening for malformations: Secondary | ICD-10-CM

## 2023-12-30 ENCOUNTER — Ambulatory Visit (INDEPENDENT_AMBULATORY_CARE_PROVIDER_SITE_OTHER): Payer: Managed Care, Other (non HMO)

## 2023-12-30 ENCOUNTER — Ambulatory Visit (INDEPENDENT_AMBULATORY_CARE_PROVIDER_SITE_OTHER): Payer: Managed Care, Other (non HMO) | Admitting: Certified Nurse Midwife

## 2023-12-30 VITALS — BP 100/65 | HR 69 | Wt 138.2 lb

## 2023-12-30 DIAGNOSIS — Z3A20 20 weeks gestation of pregnancy: Secondary | ICD-10-CM

## 2023-12-30 DIAGNOSIS — Z363 Encounter for antenatal screening for malformations: Secondary | ICD-10-CM

## 2023-12-30 DIAGNOSIS — Z3A21 21 weeks gestation of pregnancy: Secondary | ICD-10-CM | POA: Diagnosis not present

## 2023-12-30 NOTE — Patient Instructions (Signed)
 Round Ligament Pain  The round ligaments are a pair of cord-like tissues that help support the uterus. They can become a source of pain during pregnancy as the ligaments soften and stretch as the baby grows. The pain usually begins in the second trimester (13-28 weeks) of pregnancy, and should only last for a few seconds when it occurs. However, the pain can come and go until the baby is delivered. The pain does not cause harm to the baby. Round ligament pain is usually a short, sharp, and pinching pain, but it can also be a dull, lingering, and aching pain. The pain is felt in the lower side of the abdomen or in the groin. It usually starts deep in the groin and moves up to the outside of the hip area. The pain may happen when you: Suddenly change position, such as quickly going from a sitting to standing position. Do physical activity. Cough or sneeze. Follow these instructions at home: Managing pain  When the pain starts, relax. Then, try any of these methods to help with the pain: Sit down. Flex your knees up to your abdomen. Lie on your side with one pillow under your abdomen and another pillow between your legs. Sit in a warm bath for 15-20 minutes or until the pain goes away. General instructions Watch your condition for any changes. Move slowly when you sit down or stand up. Stop or reduce your physical activities if they cause pain. Avoid long walks if they cause pain. Take over-the-counter and prescription medicines only as told by your health care provider. Keep all follow-up visits. This is important. Contact a health care provider if: Your pain does not go away with treatment. You feel pain in your back that you did not have before. Your medicine is not helping. You have a fever or chills. You have nausea or vomiting. You have diarrhea. You have pain when you urinate. Get help right away if: You have pain that is a rhythmic, cramping pain similar to labor pains. Labor  pains are usually 2 minutes apart, last for about 1 minute, and involve a bearing down feeling or pressure in your pelvis. You have vaginal bleeding. These symptoms may represent a serious problem that is an emergency. Do not wait to see if the symptoms will go away. Get medical help right away. Call your local emergency services (911 in the [place] Do not drive yourself to the hospital. Summary Round ligament pain is felt in the lower abdomen or groin. This pain usually begins in the second trimester (13-28 weeks) and should only last for a few seconds when it occurs. You may notice the pain when you suddenly change position, when you cough or sneeze, or during physical activity. Relaxing, flexing your knees to your abdomen, lying on one side, or taking a warm bath may help to get rid of the pain. Contact your health care provider if the pain does not go away. This information is not intended to replace advice given to you by your health care provider. Make sure you discuss any questions you have with your health care provider. Document Revised: 01/03/2021 Document Reviewed: 01/03/2021 Elsevier Patient Education  2024 [employer].

## 2023-12-30 NOTE — Progress Notes (Signed)
 ROB doing well. Anatomy scan today. See preliminary results below. Results reviewed with pt. Discussed diet and exercise to help pt with fetal growth. Encouraged healthy lifestyle. Discussed diabetes and screening at 28 weeks. She verbalized understanding . Follow up 4 weeks for ROB and completion of anatomy.   Patient Name: Lori Stevenson DOB: Jul 06, 1996 MRN: [id number] LMP:    ULTRASOUND REPORT  Location: [facility] Date of Service: 12/30/2023   Indications:Anatomy Ultrasound Findings:  Lori Stevenson intrauterine pregnancy is visualized with FHR at 140 bpm.   Lori Stevenson gives an (U/S) Gestational age of [redacted]w[redacted]d and an (U/S) EDD of 05/09/2024; this correlates with the clinically established Estimated Date of Delivery: 05/15/24   Fetal presentation is Breech.  EFW: 431g, 0lb 15 oz; in the 93rd percentile.  Placenta: posterior with placental cord insert visualized MVP: 3.79 cm    4CH,  lvot, 3vv, 3vtv, diaphragam, profile, nose/lip, posterior fossa, choroid plexus, lateral ventricle, cavum septi pellucidi, stomach, bladder, kidneys, abdominal cord insert, upper extremities, lower extremities, hands, feet and situs were all visualized and appear normal during todays exam.  Anatomic survey is incomplete for spine and rvot due to fetal lie Gender - female.    Cervix appears long and closed measuring 3.7 cm.  Right Ovary: is not seen Left Ovary: is not seen. Survey of the adnexa demonstrates no adnexal masses.  There is no free peritoneal fluid in the cul de sac.  Impression: 1. [redacted]w[redacted]d Viable Singleton Intrauterine pregnancy by U/S. 2. (U/S) EDD is consistent with Clinically established Estimated Date of Delivery: 05/15/24 . 3. Incomplete Anatomy Scan for spine and rvot. All other anatomy was visualized and appears normal  Recommendations: 1.Clinical correlation with the patient's History and Physical Exam. 2. Follow up ultrasound in 4 weeks  Katheran Awe, RDMS

## 2024-01-27 ENCOUNTER — Other Ambulatory Visit: Payer: Self-pay | Admitting: Certified Nurse Midwife

## 2024-01-27 ENCOUNTER — Encounter: Payer: Self-pay | Admitting: Obstetrics

## 2024-01-27 ENCOUNTER — Ambulatory Visit (INDEPENDENT_AMBULATORY_CARE_PROVIDER_SITE_OTHER): Payer: Managed Care, Other (non HMO) | Admitting: Obstetrics

## 2024-01-27 ENCOUNTER — Ambulatory Visit: Payer: Managed Care, Other (non HMO)

## 2024-01-27 VITALS — BP 100/65 | HR 73 | Wt 141.0 lb

## 2024-01-27 DIAGNOSIS — Z3A25 25 weeks gestation of pregnancy: Secondary | ICD-10-CM | POA: Diagnosis not present

## 2024-01-27 DIAGNOSIS — D649 Anemia, unspecified: Secondary | ICD-10-CM | POA: Diagnosis not present

## 2024-01-27 DIAGNOSIS — Z362 Encounter for other antenatal screening follow-up: Secondary | ICD-10-CM

## 2024-01-27 DIAGNOSIS — Z3A24 24 weeks gestation of pregnancy: Secondary | ICD-10-CM

## 2024-01-27 DIAGNOSIS — Z3A20 20 weeks gestation of pregnancy: Secondary | ICD-10-CM

## 2024-01-27 DIAGNOSIS — Z131 Encounter for screening for diabetes mellitus: Secondary | ICD-10-CM

## 2024-01-27 DIAGNOSIS — Z3480 Encounter for supervision of other normal pregnancy, unspecified trimester: Secondary | ICD-10-CM

## 2024-01-27 DIAGNOSIS — N750 Cyst of Bartholin's gland: Secondary | ICD-10-CM

## 2024-01-27 DIAGNOSIS — Z113 Encounter for screening for infections with a predominantly sexual mode of transmission: Secondary | ICD-10-CM

## 2024-01-27 DIAGNOSIS — Z2913 Encounter for prophylactic Rho(D) immune globulin: Secondary | ICD-10-CM

## 2024-01-27 LAB — POCT URINALYSIS DIPSTICK OB
Bilirubin, UA: NEGATIVE
Blood, UA: NEGATIVE
Glucose, UA: NEGATIVE
Ketones, UA: NEGATIVE
Leukocytes, UA: NEGATIVE
Nitrite, UA: NEGATIVE
POC,PROTEIN,UA: NEGATIVE
Spec Grav, UA: 1.01 (ref 1.010–1.025)
Urobilinogen, UA: 0.2 U/dL
pH, UA: 7 (ref 5.0–8.0)

## 2024-01-27 NOTE — Assessment & Plan Note (Addendum)
-  Anatomy US complete today. Growth 70th%ile -Discussed comfort measures for eczema/rash - okay to take Benadryl; hydrocortisone cream, emollients PRN -Discussed 28-week labs at next visit and preparing for glucose test -Reviewed s/s of PTL, danger signs, and when to go to the hospital

## 2024-01-27 NOTE — Progress Notes (Signed)
    Return Prenatal Note   Assessment/Plan   Plan  28 y.o. G2P1001 at [redacted]w[redacted]d presents for follow-up OB visit. Reviewed prenatal record including previous visit note.  Supervision of other normal pregnancy, antepartum -Anatomy US complete today. Growth 70th%ile -Discussed comfort measures for eczema/rash - okay to take Benadryl; hydrocortisone cream, emollients PRN -Discussed 28-week labs at next visit and preparing for glucose test -Reviewed s/s of PTL, danger signs, and when to go to the hospital   Bartholin cyst -Recommend sitz baths and warm compresses. Return if it becomes larger or painful.   Orders Placed This Encounter  Procedures   28 Weeks RH-Panel    Standing Status:   Future    Expected Date:   02/27/2024    Expiration Date:   01/26/2025   POC Urinalysis Dipstick OB   Return in about 4 weeks (around 02/24/2024).   Future Appointments  Date Time Provider Department Center  02/24/2024 10:00 AM [facility]-OBGYN LAB [facility] None  02/24/2024 10:55 AM Dominica Severin, CNM [facility] None    For next visit:  ROB with 28-week labs, TDaP, and Rhogam    Subjective   Lori Stevenson noticed a swelling/pressure in her right labia the other day. It is not painful but feels uncomfortable. There is no itching, discharge, or lesions. She also reports a rash that appeared on her face the other day. She has a h/o eczema. It resolved with Benadryl and cream.  Movement: Present Contractions: Not present  Objective   Flow sheet Vitals: Pulse Rate: 73 BP: 100/65 Total weight gain: 4 lb (1.814 kg)  General Appearance  No acute distress, well appearing, and well nourished Pulmonary   Normal work of breathing Neurologic   Alert and oriented to person, place, and time Psychiatric   Mood and affect within normal limits Pevlic    Lower part of lower labia swollen, non-tender, consistent with Bartolin gland cyst. No erythema or discharge [name], CNM 01/27/24 4:48 PM

## 2024-01-27 NOTE — Assessment & Plan Note (Signed)
-  Recommend sitz baths and warm compresses. Return if it becomes larger or painful.

## 2024-02-07 ENCOUNTER — Observation Stay
Admission: EM | Admit: 2024-02-07 | Discharge: 2024-02-07 | Disposition: A | Discharge status: Home / Self Care | Attending: Licensed Practical Nurse | Admitting: Licensed Practical Nurse

## 2024-02-07 ENCOUNTER — Other Ambulatory Visit: Payer: Self-pay

## 2024-02-07 ENCOUNTER — Encounter: Payer: Self-pay | Admitting: Obstetrics and Gynecology

## 2024-02-07 DIAGNOSIS — J45909 Unspecified asthma, uncomplicated: Secondary | ICD-10-CM | POA: Insufficient documentation

## 2024-02-07 DIAGNOSIS — Z79899 Other long term (current) drug therapy: Secondary | ICD-10-CM | POA: Insufficient documentation

## 2024-02-07 DIAGNOSIS — O26892 Other specified pregnancy related conditions, second trimester: Secondary | ICD-10-CM

## 2024-02-07 DIAGNOSIS — R109 Unspecified abdominal pain: Secondary | ICD-10-CM

## 2024-02-07 DIAGNOSIS — O4702 False labor before 37 completed weeks of gestation, second trimester: Secondary | ICD-10-CM | POA: Diagnosis present

## 2024-02-07 DIAGNOSIS — Z87891 Personal history of nicotine dependence: Secondary | ICD-10-CM | POA: Diagnosis not present

## 2024-02-07 DIAGNOSIS — Z3480 Encounter for supervision of other normal pregnancy, unspecified trimester: Principal | ICD-10-CM

## 2024-02-07 DIAGNOSIS — Z3A26 26 weeks gestation of pregnancy: Secondary | ICD-10-CM

## 2024-02-07 DIAGNOSIS — O479 False labor, unspecified: Secondary | ICD-10-CM | POA: Diagnosis present

## 2024-02-07 DIAGNOSIS — O99512 Diseases of the respiratory system complicating pregnancy, second trimester: Secondary | ICD-10-CM | POA: Insufficient documentation

## 2024-02-07 LAB — URINALYSIS, COMPLETE (UACMP) WITH MICROSCOPIC
Bilirubin Urine: NEGATIVE
Glucose, UA: NEGATIVE mg/dL
Hgb urine dipstick: NEGATIVE
Ketones, ur: NEGATIVE mg/dL
Nitrite: NEGATIVE
Protein, ur: NEGATIVE mg/dL
Specific Gravity, Urine: 1.017 (ref 1.005–1.030)
pH: 6 (ref 5.0–8.0)

## 2024-02-07 LAB — CHLAMYDIA/NGC RT PCR (ARMC ONLY)
Chlamydia Tr: NOT DETECTED
N gonorrhoeae: NOT DETECTED

## 2024-02-07 LAB — WET PREP, GENITAL
Clue Cells Wet Prep HPF POC: NONE SEEN
Sperm: NONE SEEN
Trich, Wet Prep: NONE SEEN
WBC, Wet Prep HPF POC: <10 (ref <10)
Yeast Wet Prep HPF POC: NONE SEEN

## 2024-02-07 LAB — CBC WITH DIFFERENTIAL/PLATELET
Abs Immature Granulocytes: 0.04 10*3/uL (ref 0.00–0.07)
Basophils Absolute: 0 10*3/uL (ref 0.0–0.1)
Basophils Relative: 0 %
Eosinophils Absolute: 0.1 10*3/uL (ref 0.0–0.5)
Eosinophils Relative: 1 %
HCT: 31.3 % — ABNORMAL LOW (ref 36.0–46.0)
Hemoglobin: 10.6 g/dL — ABNORMAL LOW (ref 12.0–15.0)
Immature Granulocytes: 1 %
Lymphocytes Relative: 32 %
Lymphs Abs: 2.4 10*3/uL (ref 0.7–4.0)
MCH: 29.4 pg (ref 26.0–34.0)
MCHC: 33.9 g/dL (ref 30.0–36.0)
MCV: 86.9 fL (ref 80.0–100.0)
Monocytes Absolute: 0.5 10*3/uL (ref 0.1–1.0)
Monocytes Relative: 7 %
Neutro Abs: 4.5 10*3/uL (ref 1.7–7.7)
Neutrophils Relative %: 59 %
Platelets: 245 10*3/uL (ref 150–400)
RBC: 3.6 MIL/uL — ABNORMAL LOW (ref 3.87–5.11)
RDW: 12.2 % (ref 11.5–15.5)
WBC: 7.6 10*3/uL (ref 4.0–10.5)
nRBC: 0 % (ref 0.0–0.2)

## 2024-02-07 NOTE — OB Triage Note (Signed)
Discharge home. Left floor ambulatory with her mother. Lori Stevenson S  

## 2024-02-07 NOTE — OB Triage Note (Signed)
 Pt reports having cramps since 10 this am. Denies vaginal bleeding, LOF, change in vaginal discharge. Reports + fetal movement. Denies recent intercourse. Lori Stevenson

## 2024-02-07 NOTE — Discharge Summary (Signed)
 Physician Final Progress Note  Patient ID: Lori Stevenson MRN: [id number] DOB/AGE: July 04, 1996 27 y.o.  Admit date: 02/07/2024 Admitting provider: Suzy Bouchard, MD Discharge date: 02/07/2024  Admission Diagnoses:  1) intrauterine pregnancy at [redacted]w[redacted]d  2) Contractions   Discharge Diagnoses:   1) Braxton Hicks Contractions vs Uterine cramping   History of Present Illness: The patient is a 28 y.o. female G2P1001 at [redacted]w[redacted]d who presents to triage after experiencing what she describes as being a "long cramp" across the top of her abdomen while working this morning. She was sitting down at work and noted that it happened around 10 am and reports it "took her breath away. " She did not experience this in her previous pregnancy, and was concerned so decided to come to triage to be evaluated. She reports the pain is better after resting in triage bed.   Of note, Lori Stevenson was seen recently in the office for a Bartholin's cyst. She reports vaginal discomfort but has been doing sitz baths for management.    She denies VB, LOF, and endorses good fetal movement.   Past Medical History:  Diagnosis Date   Asthma    Encounter for supervision of normal first pregnancy in third trimester 06/18/2022              Clinical Staff    Provider      Office Location     [facility]    Dating     LMP 08/04/2021      Language     English    Anatomy US     Normal      Flu Vaccine          Genetic Screen     NIPS: Declined      TDaP vaccine      Given 05/03/22    Hgb A1C or   GTT    Early :  Third trimester : 120      Covid              LAB RESULTS       Rhogam     Given 05/23/22    Blood Type    O/Negat    Past Surgical History:  Procedure Laterality Date   WISDOM TOOTH EXTRACTION     four;  age 82    No current facility-administered medications on file prior to encounter.   Current Outpatient Medications on File Prior to Encounter  Medication Sig Dispense Refill   Prenatal Vit-Fe Fumarate-FA (PRENATAL  PO) Take by mouth.     albuterol (PROVENTIL) (2.5 MG/3ML) 0.083% nebulizer solution Take 3 mLs (2.5 mg total) by nebulization every 6 (six) hours as needed for wheezing or shortness of breath. 75 mL 12   albuterol (VENTOLIN HFA) 108 (90 Base) MCG/ACT inhaler Inhale 2 puffs into the lungs every 4 (four) hours as needed. 6.7 g 2   Ginger 500 MG CAPS Take 1 capsule by mouth daily. (Patient not taking: Reported on 02/07/2024)     ipratropium (ATROVENT) 0.06 % nasal spray Place 2 sprays into both nostrils 4 (four) times daily. (Patient not taking: Reported on 12/02/2023) 15 mL 0   ondansetron (ZOFRAN-ODT) 4 MG disintegrating tablet Take 1 tablet (4 mg total) by mouth every 8 (eight) hours as needed for nausea or vomiting. 20 tablet 2   pyridOXINE (VITAMIN B6) 25 MG tablet Take 25 mg by mouth daily.     [DISCONTINUED] omeprazole (PRILOSEC OTC) 20 MG tablet Take 1 tablet (20 mg  total) by mouth daily. 28 tablet 1   [DISCONTINUED] promethazine (PHENERGAN) 25 MG tablet Take 25 mg by mouth every 6 (six) hours as needed for nausea or vomiting.     [DISCONTINUED] sucralfate (CARAFATE) 1 g tablet Take 1 tablet (1 g total) by mouth 4 (four) times daily as needed (for abdominal discomfort, nausea, and/or vomiting). 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  Tobacco Use   Smoking status: Former    Types: Cigarettes    Passive exposure: Never   Smokeless tobacco: Never  Vaping Use   Vaping status: Never Used  Substance and Sexual Activity   Alcohol use: No   Drug use: No   Sexual activity: Yes    Partners: Male    Birth control/protection: None    Comment: undecided  Other Topics Concern   Not on file  Social History Narrative   Right handed   One  story home   Drinks caffeineq   Social Drivers of Health   Financial Resource Strain: Low Risk  (10/01/2023)   Overall Financial Resource Strain (CARDIA)    Difficulty of Paying Living Expenses: Not very hard  Food Insecurity: No Food Insecurity (10/01/2023)   Hunger Vital Sign    Worried About Running Out of Food in the Last Year: Never true    Ran Out of Food in the Last Year: Never true  Transportation Needs: No Transportation Needs (10/01/2023)   PRAPARE - Administrator, Civil Service (Medical): No    Lack of Transportation (Non-Medical): No  Physical Activity: Insufficiently Active (10/01/2023)   Exercise Vital Sign    Days of Exercise per Week: 2 days    Minutes of Exercise per Session: 60 min  Stress: No Stress Concern Present (10/01/2023)   [employer] of Occupational Health - Occupational Stress Questionnaire    Feeling of Stress : Not at all  Social Connections: Moderately Integrated (10/01/2023)   Social Connection and Isolation Panel [NHANES]    Frequency of Communication with Friends and Family: More than three times a week    Frequency of Social Gatherings with Friends and Family: Once a week    Attends Religious Services: 1 to 4 times per year    Active Member of [employer] or Organizations: No    Attends Banker Meetings: Never    Marital Status: Married  Catering manager Violence: Not At Risk (10/01/2023)   Humiliation, Afraid, Rape, and Kick questionnaire    Fear of Current or Ex-Partner: No    Emotionally Abused: No    Physically Abused: No    Sexually Abused: No    Family History  Problem Relation Age of Onset   Thyroid disease Mother    Healthy Father    Healthy Maternal Grandmother    Healthy Maternal Grandfather    Healthy Paternal Grandmother    Cancer Paternal Grandfather 35       lung   Healthy Half-Sister    Healthy Half-Sister    Healthy Half-Sister    Asthma Half-Brother    Breast cancer Neg Hx    Colon cancer  Neg Hx      Review of Systems  Musculoskeletal:  Abdominal cramp/contraction   All other systems reviewed and are negative.    Physical Exam: BP 104/65 (BP Location: Right Arm)   Pulse 79   Temp 98.2 F (36.8 C) (Oral)   Resp 16   LMP 08/09/2023 (Exact Date)   Physical Exam Constitutional:      Appearance: Normal appearance.  Genitourinary:     Genitourinary Comments: Swelling on R lower aspect of vulva, consistent with location of previously diagnosed Bartholin's cyst   HENT:     Head: Normocephalic and atraumatic.  Cardiovascular:     Rate and Rhythm: Normal rate and regular rhythm.     Heart sounds: Normal heart sounds.  Pulmonary:     Effort: Pulmonary effort is normal.     Breath sounds: Normal breath sounds.  Abdominal:     Palpations: Abdomen is soft.     Comments: Gravid   Musculoskeletal:        General: Normal range of motion.     Cervical back: Normal range of motion.  Neurological:     Mental Status: She is alert and oriented to person, place, and time.  Skin:    General: Skin is warm and dry.  Psychiatric:        Mood and Affect: Mood normal.        Behavior: Behavior normal.     Fetus A Non-Stress Test Interpretation for 02/07/24  Indication:  triage evaluation for contractions  Fetal Heart Rate A Mode: External Baseline Rate (A): 145 bpm Variability: Moderate Accelerations: 15 x 15 Decelerations: Variable  Uterine Activity Mode: Toco Contraction Frequency (min): ctx x 1 Contraction Duration (sec): 40 Contraction Quality: Mild Resting Tone Palpated: Relaxed Resting Time: Adequate     Consults: None  Significant Findings/ Diagnostic Studies: labs: CBC, wet prep, UA with culture, CG/CT   Procedures: NST, reactive   Hospital Course: The patient was admitted to Labor and Delivery Triage for observation. External fetal monitors were applied for fetal monitoring. Labs as described above were sent. Given that cramping has subsided,  and CBC and wet prep are normal, Lori Stevenson will be discharged home. Will await results of GC/CT and advise if positive. Urinalysis reflexed for culture due to moderate leukocytes in absence of urinary symptoms, and will advise if treatment for UTI is needed.   Third trimester precautions given, as well as comfort measures for common discomforts of pregnancy, which were discussed. We discussed importance of good hydration in pregnancy, especially as it is getting warmer outside. Lori Stevenson is aware that we are awaiting results and that we will contact her with further instructions if necessary.   Discharge Condition: stable  Disposition: Home with self care   Diet: Regular diet  Discharge Activity: Activity as tolerated   Allergies as of 02/07/2024   No Known Allergies      Medication List     STOP taking these medications    Ginger 500 MG Caps       TAKE these medications    albuterol 108 (90 Base) MCG/ACT inhaler Commonly known as: VENTOLIN HFA Inhale 2 puffs into the lungs every 4 (four) hours as needed.   albuterol (2.5 MG/3ML) 0.083% nebulizer solution Commonly known as: PROVENTIL Take 3 mLs (2.5 mg total) by nebulization every 6 (six) hours as needed for wheezing or shortness of breath.   ipratropium 0.06 % nasal spray Commonly known as: ATROVENT Place 2 sprays into both nostrils 4 (four) times daily.   ondansetron 4 MG disintegrating tablet Commonly known as: ZOFRAN-ODT  Take 1 tablet (4 mg total) by mouth every 8 (eight) hours as needed for nausea or vomiting.   PRENATAL PO Take by mouth.   pyridOXINE 25 MG tablet Commonly known as: VITAMIN B6 Take 25 mg by mouth daily.         Total time spent taking care of this patient: 20 minutes  Signed: Cindra Eves, SNM [facility], CNM  02/07/2024, 1:22 PM

## 2024-02-08 LAB — URINE CULTURE

## 2024-02-19 ENCOUNTER — Telehealth: Payer: Self-pay

## 2024-02-19 ENCOUNTER — Other Ambulatory Visit (HOSPITAL_COMMUNITY)
Admission: RE | Admit: 2024-02-19 | Discharge: 2024-02-19 | Disposition: A | Discharge status: Home / Self Care | Source: Ambulatory Visit | Attending: Licensed Practical Nurse | Admitting: Licensed Practical Nurse

## 2024-02-19 ENCOUNTER — Ambulatory Visit (INDEPENDENT_AMBULATORY_CARE_PROVIDER_SITE_OTHER): Admitting: Licensed Practical Nurse

## 2024-02-19 VITALS — BP 109/73 | HR 98 | Wt 145.8 lb

## 2024-02-19 DIAGNOSIS — N898 Other specified noninflammatory disorders of vagina: Secondary | ICD-10-CM | POA: Diagnosis present

## 2024-02-19 DIAGNOSIS — R399 Unspecified symptoms and signs involving the genitourinary system: Secondary | ICD-10-CM | POA: Diagnosis not present

## 2024-02-19 LAB — POCT URINALYSIS DIPSTICK OB
Appearance: NORMAL
Bilirubin, UA: NEGATIVE
Blood, UA: NEGATIVE
Glucose, UA: NEGATIVE
Ketones, UA: NEGATIVE
Nitrite, UA: NEGATIVE
Odor: NORMAL
POC,PROTEIN,UA: NEGATIVE
Spec Grav, UA: 1.015 (ref 1.010–1.025)
Urobilinogen, UA: 0.2 U/dL
pH, UA: 5 (ref 5.0–8.0)

## 2024-02-19 MED ORDER — SULFAMETHOXAZOLE-TRIMETHOPRIM 800-160 MG PO TABS: 1.0000 | ORAL_TABLET | Freq: Two times a day (BID) | ORAL | 1 refills | Status: DC

## 2024-02-19 NOTE — Progress Notes (Signed)
    Problem Visit Prenatal Note   Subjective   28 y.o. G2P1001 at [redacted]w[redacted]d presents for this follow-up prenatal visit.  Patient Here for problem visit. While using the restroom earlier today Lori Stevenson saw some old brown blood when she wiped. She also has had lower abdominal cramping after urinating and burning/irration near her urethra for over 1 week. She has had low back pain for a while-feels this is normal. Denies fevers or urgency or frequency. She has had UTI's in the past that have presented this way. She drinks about 48oz of water a day plus a [employer]. She has not had IC in a while d/t vaginal discomforts. Lori Stevenson was evaluate din triage on 4/10, her wet prep and gc/ct were negative and urine culture showed multiple species were present suggesting recollection. Pt denies hx of HSV.   Patient reports: Movement: Present Contractions: Not present  Objective   Flow sheet Vitals: Pulse Rate: 98 BP: 109/73 Fetal Heart Rate (bpm): 140 Total weight gain: 8 lb 12.8 oz (3.992 kg)  General Appearance  No acute distress, well appearing, and well nourished Pulmonary   Normal work of breathing Neurologic   Alert and oriented to person, place, and time Psychiatric   Mood and affect within normal limits Pelivic exam:                          External genitalia WNL but tissue surrounding urethra irritates looking, tissue with barely visible tear-tender to touch. SSE cervix pink, pinpoint cyst at 12 O'clock, no bleeding, physiologic discharge present.   Negative CVAT   Assessment/Plan   Plan  28 y.o. G2P1001 at [redacted]w[redacted]d presents for follow-up OB visit. Reviewed prenatal record including previous visit note.  No problem-specific Assessment & Plan notes found for this encounter.      Orders Placed This Encounter  Procedures   Urine Culture    Release to patient:   Immediate [1]   Herpes simplex virus culture   POC Urinalysis Dipstick OB   No follow-ups on file.   Future Appointments   Date Time Provider Department Center  02/24/2024 10:00 AM AOB-OBGYN LAB [facility] None  02/24/2024 10:55 AM [name], CNM [facility] None    For next visit:  ROB with 1 hour glucola, third trimester labs, and Tdap     Berkley Breech [facility], CNM  04/17/254:38 PM

## 2024-02-19 NOTE — Telephone Encounter (Signed)
 TRIAGE VOICEMAIL: Patient states she is spotting. Requesting return call.

## 2024-02-19 NOTE — Telephone Encounter (Signed)
 Patient states she just went to the restroom and noticed a red/brown discharge. She states she is having pain with urination. She is also experiencing cramping in the lower abdomen. Patient denies intercourse in past 48 hours. Discussed with Lonzell Robin, can come in office or go to L&D for evaluation per patient preference. Patient scheduled with Lonzell Robin today at 3:35 per patient preference.

## 2024-02-21 ENCOUNTER — Encounter: Payer: Self-pay | Admitting: Licensed Practical Nurse

## 2024-02-21 LAB — URINE CULTURE

## 2024-02-22 LAB — HERPES SIMPLEX VIRUS CULTURE

## 2024-02-23 LAB — CERVICOVAGINAL ANCILLARY ONLY
Bacterial Vaginitis (gardnerella): NEGATIVE
Candida Glabrata: POSITIVE — AB
Candida Vaginitis: NEGATIVE
Comment: NEGATIVE
Comment: NEGATIVE
Comment: NEGATIVE

## 2024-02-24 ENCOUNTER — Other Ambulatory Visit

## 2024-02-24 ENCOUNTER — Ambulatory Visit (INDEPENDENT_AMBULATORY_CARE_PROVIDER_SITE_OTHER): Admitting: Certified Nurse Midwife

## 2024-02-24 VITALS — BP 107/72 | HR 69 | Wt 146.3 lb

## 2024-02-24 DIAGNOSIS — O360130 Maternal care for anti-D [Rh] antibodies, third trimester, not applicable or unspecified: Secondary | ICD-10-CM | POA: Diagnosis not present

## 2024-02-24 DIAGNOSIS — O26899 Other specified pregnancy related conditions, unspecified trimester: Secondary | ICD-10-CM

## 2024-02-24 DIAGNOSIS — Z23 Encounter for immunization: Secondary | ICD-10-CM | POA: Diagnosis not present

## 2024-02-24 DIAGNOSIS — Z3A28 28 weeks gestation of pregnancy: Secondary | ICD-10-CM

## 2024-02-24 DIAGNOSIS — Z131 Encounter for screening for diabetes mellitus: Secondary | ICD-10-CM

## 2024-02-24 DIAGNOSIS — Z113 Encounter for screening for infections with a predominantly sexual mode of transmission: Secondary | ICD-10-CM

## 2024-02-24 DIAGNOSIS — Z3480 Encounter for supervision of other normal pregnancy, unspecified trimester: Secondary | ICD-10-CM

## 2024-02-24 DIAGNOSIS — Z2913 Encounter for prophylactic Rho(D) immune globulin: Secondary | ICD-10-CM

## 2024-02-24 DIAGNOSIS — D649 Anemia, unspecified: Secondary | ICD-10-CM

## 2024-02-24 DIAGNOSIS — Z6791 Unspecified blood type, Rh negative: Secondary | ICD-10-CM

## 2024-02-24 LAB — POCT URINALYSIS DIPSTICK
Bilirubin, UA: NEGATIVE
Glucose, UA: NEGATIVE
Ketones, UA: NEGATIVE
Leukocytes, UA: NEGATIVE
Nitrite, UA: NEGATIVE
Protein, UA: NEGATIVE
Spec Grav, UA: 1.01 (ref 1.010–1.025)
Urobilinogen, UA: 0.2 U/dL
pH, UA: 7.5 (ref 5.0–8.0)

## 2024-02-24 MED ORDER — RHO D IMMUNE GLOBULIN 1500 UNIT/2ML IJ SOSY
300.0000 ug | PREFILLED_SYRINGE | Freq: Once | INTRAMUSCULAR | Status: AC
  Administered 2024-02-24: 300 ug via INTRAMUSCULAR

## 2024-02-24 NOTE — Patient Instructions (Signed)
 Third Trimester of Pregnancy  The third trimester of pregnancy is from week 28 through week 40. This is months 7 through 9. The third trimester is a time when your baby is growing fast. Body changes during your third trimester Your body continues to change during this time. The changes usually go away after your baby is born. Physical changes You will continue to gain weight. You may get stretch marks on your hips, belly, and breasts. Your breasts will keep growing and may hurt. A yellow fluid (colostrum) may leak from your breasts. This is the first milk you're making for your baby. Your hair may grow faster and get thicker. In some cases, you may get hair loss. Your belly button may stick out. You may have more swelling in your hands, face, or ankles. Health changes You may have heartburn. You may feel short of breath. This is caused by the uterus that is now bigger. You may have more aches in the pelvis, back, or thighs. You may have more tingling or numbness in your hands, arms, and legs. You may pee more often. You may have trouble pooping (constipation) or swollen veins in the butt that can itch or get painful (hemorrhoids). Other changes You may have more problems sleeping. You may notice the baby moving lower in your belly (dropping). You may have more fluid coming from your vagina. Your joints may feel loose, and you may have pain around your pelvic bone. Follow these instructions at home: Medicines Take medicines only as told by your health care provider. Some medicines are not safe during pregnancy. Your provider may change the medicines that you take. Do not take any medicines unless told to by your provider. Take a prenatal vitamin that has at least 600 micrograms (mcg) of folic acid. Do not use herbal medicines, illegal drugs, or medicines that are not approved by your provider. Eating and drinking While you're pregnant your body needs additional nutrition to help  support your growing baby. Talk with your provider about your nutritional needs. Activity Most women are able to exercise regularly during pregnancy. Exercise routines may need to change at the end of your pregnancy. Talk to your provider about your activities and exercise routine. Relieving pain and discomfort Rest often with your legs raised if you have leg cramps or low back pain. Take warm sitz baths to soothe pain from hemorrhoids. Use hemorrhoid cream if your provider says it's okay. Wear a good, supportive bra if your breasts hurt. Do not use hot tubs, steam rooms, or saunas. Do not douche. Do not use tampons or scented pads. Safety Talk to your provider before traveling far distances. Wear your seatbelt at all times when you're in a car. Talk to your provider if someone hits you, hurts you, or yells at you. Preparing for birth To prepare for your baby: Take childbirth and breastfeeding classes. Visit the hospital and tour the maternity area. Buy a rear-facing car seat. Learn how to install it in your car. General instructions Avoid cat litter boxes and soil used by cats. These things carry germs that can cause harm to your pregnancy and your baby. Do not drink alcohol, smoke, vape, or use products with nicotine or tobacco in them. If you need help quitting, talk with your provider. Keep all follow-up visits for your third trimester. Your provider will do more exams and tests during this trimester. Write down your questions. Take them to your prenatal visits. Your provider also will: Talk with you about  your overall health. Give you advice or refer you to specialists who can help with different needs, including: Mental health and counseling. Foods and healthy eating. Ask for help if you need help with food. Where to find more information American Pregnancy Association: americanpregnancy.org [employer] of Obstetricians and Gynecologists: acog.org Office on [employer] Health:  [employer] Contact a health care provider if: You have a headache that does not go away when you take medicine. You have any of these problems: You can't eat or drink. You have nausea and vomiting. You have watery poop (diarrhea) for 2 days or more. You have pain when you pee, or your pee smells bad. You have been sick for 2 days or more and aren't getting better. Contact your provider right away if: You have any of these coming from your vagina: Abnormal discharge. Bad-smelling fluid. Bleeding. Your baby is moving less than usual. You have signs of labor: You have any contractions, belly cramping, or have pain in your pelvis or lower back before 37 weeks of pregnancy (preterm labor). You have regular contractions that are less than 5 minutes apart. Your water breaks. You have symptoms of high blood pressure or preeclampsia. These include: A severe, throbbing headache that does not go away. Sudden or extreme swelling of your face, hands, legs, or feet. Vision problems: You see spots. You have blurry vision. Your eyes are sensitive to light. If you can't reach your provider, go to an urgent care or emergency room. Get help right away if: You faint, become confused, or can't think clearly. You have chest pain or trouble breathing. You have any kind of injury, such as from a fall or a car crash. These symptoms may be an emergency. Call 911 right away. Do not wait to see if the symptoms will go away. Do not drive yourself to the hospital. This information is not intended to replace advice given to you by your health care provider. Make sure you discuss any questions you have with your health care provider. Document Revised: 07/24/2023 Document Reviewed: 02/21/2023 Elsevier Patient Education  2024 [employer].

## 2024-02-24 NOTE — Assessment & Plan Note (Signed)
 Reviewed kick counts and preterm labor warning signs. Instructed to call office or come to hospital with persistent headache, vision changes, regular contractions, leaking of fluid, decreased fetal movement or vaginal bleeding. 1h & 28w labs today. TDAP administered. Comfort measures for tissue irritation to labia/vaginal opening reviewed. Candida glabrata treatment discussed, if no improvement with comfort measures consider vaginal nystatin, however little evidence for use in pregnancy.

## 2024-02-24 NOTE — Progress Notes (Signed)
    Return Prenatal Note   Subjective   28 y.o. G2P1001 at [redacted]w[redacted]d presents for this follow-up prenatal visit.  Patient reports vaginal & labial irritation is improving, using topical sensitive vagisil & cold compress. Declines exam today as symptoms have improved. Stopped antibiotic as directed. Patient reports: Movement: Present Contractions: Not present  Objective   Flow sheet Vitals: Pulse Rate: 69 BP: 107/72 Fundal Height: 28 cm Fetal Heart Rate (bpm): 145 Total weight gain: 9 lb 4.8 oz (4.218 kg)  General Appearance  No acute distress, well appearing, and well nourished Pulmonary   Normal work of breathing Neurologic   Alert and oriented to person, place, and time Psychiatric   Mood and affect within normal limits  Assessment/Plan   Plan  28 y.o. G2P1001 at [redacted]w[redacted]d presents for follow-up OB visit. Reviewed prenatal record including previous visit note.  Rh negative state in antepartum period Rhogam today.  Supervision of other normal pregnancy, antepartum Reviewed kick counts and preterm labor warning signs. Instructed to call office or come to hospital with persistent headache, vision changes, regular contractions, leaking of fluid, decreased fetal movement or vaginal bleeding. 1h & 28w labs today. TDAP administered. Comfort measures for tissue irritation to labia/vaginal opening reviewed. Candida glabrata treatment discussed, if no improvement with comfort measures consider vaginal nystatin, however little evidence for use in pregnancy.      Orders Placed This Encounter  Procedures   Tdap vaccine greater than or equal to 7yo IM   POCT urinalysis dipstick   Return in 2 weeks (on 03/09/2024) for ROB.   Future Appointments  Date Time Provider Department Center  03/08/2024  3:55 PM Phylliss Brenner, CNM [facility] None    For next visit:  continue with routine prenatal care     [name], CNM  02/23/2510:48 AM

## 2024-02-24 NOTE — Assessment & Plan Note (Signed)
 -  Rhogam today

## 2024-02-25 ENCOUNTER — Other Ambulatory Visit: Payer: Self-pay | Admitting: Licensed Practical Nurse

## 2024-02-25 ENCOUNTER — Encounter: Payer: Self-pay | Admitting: Licensed Practical Nurse

## 2024-02-25 DIAGNOSIS — B379 Candidiasis, unspecified: Secondary | ICD-10-CM

## 2024-02-25 MED ORDER — FLUCONAZOLE 150 MG PO TABS
150.0000 mg | ORAL_TABLET | ORAL | 0 refills | Status: AC
Start: 2024-02-25 — End: 2024-02-29

## 2024-02-26 ENCOUNTER — Encounter: Payer: Self-pay | Admitting: Obstetrics

## 2024-02-26 LAB — 28 WEEKS RH-PANEL
Antibody Screen: NEGATIVE
Basophils Absolute: 0 10*3/uL (ref 0.0–0.2)
Basos: 0 %
EOS (ABSOLUTE): 0.1 10*3/uL (ref 0.0–0.4)
Eos: 1 %
Gestational Diabetes Screen: 118 mg/dL (ref 70–139)
HIV Screen 4th Generation wRfx: NONREACTIVE
Hematocrit: 33.7 % — ABNORMAL LOW (ref 34.0–46.6)
Hemoglobin: 11 g/dL — ABNORMAL LOW (ref 11.1–15.9)
Immature Grans (Abs): 0 10*3/uL (ref 0.0–0.1)
Immature Granulocytes: 1 %
Lymphocytes Absolute: 2.4 10*3/uL (ref 0.7–3.1)
Lymphs: 30 %
MCH: 28 pg (ref 26.6–33.0)
MCHC: 32.6 g/dL (ref 31.5–35.7)
MCV: 86 fL (ref 79–97)
Monocytes Absolute: 0.4 10*3/uL (ref 0.1–0.9)
Monocytes: 5 %
Neutrophils Absolute: 4.9 10*3/uL (ref 1.4–7.0)
Neutrophils: 63 %
Platelets: 246 10*3/uL (ref 150–450)
RBC: 3.93 x10E6/uL (ref 3.77–5.28)
RDW: 12.1 % (ref 11.7–15.4)
RPR Ser Ql: NONREACTIVE
WBC: 7.8 10*3/uL (ref 3.4–10.8)

## 2024-03-08 ENCOUNTER — Encounter: Payer: Self-pay | Admitting: Obstetrics

## 2024-03-08 ENCOUNTER — Ambulatory Visit (INDEPENDENT_AMBULATORY_CARE_PROVIDER_SITE_OTHER): Admitting: Obstetrics

## 2024-03-08 VITALS — BP 102/66 | HR 89 | Wt 149.0 lb

## 2024-03-08 DIAGNOSIS — Z3480 Encounter for supervision of other normal pregnancy, unspecified trimester: Secondary | ICD-10-CM | POA: Diagnosis not present

## 2024-03-08 DIAGNOSIS — L309 Dermatitis, unspecified: Secondary | ICD-10-CM | POA: Insufficient documentation

## 2024-03-08 MED ORDER — TRIAMCINOLONE ACETONIDE 0.5 % EX OINT: 1.0000 | TOPICAL_OINTMENT | Freq: Two times a day (BID) | CUTANEOUS | 0 refills | Status: DC

## 2024-03-08 NOTE — Assessment & Plan Note (Signed)
-  Discussed comfort measures for BH ctx, back pain -Yeast and Bartholin's cyst have resolved -Reviewed kick counts and preterm labor warning signs. Instructed to call office or come to hospital with persistent headache, vision changes, regular contractions, leaking of fluid, decreased fetal movement or vaginal bleeding.

## 2024-03-08 NOTE — Assessment & Plan Note (Signed)
-  Apply emollients. Rx sent for triamcinolone  0.5%.

## 2024-03-08 NOTE — Progress Notes (Signed)
    Return Prenatal Note   Assessment/Plan   Plan  28 y.o. G2P1001 at [redacted]w[redacted]d presents for follow-up OB visit. Reviewed prenatal record including previous visit note.  Eczema -Apply emollients. Rx sent for triamcinolone  0.5%.   Supervision of other normal pregnancy, antepartum -Discussed comfort measures for BH ctx, back pain -Yeast and Bartholin's cyst have resolved -Reviewed kick counts and preterm labor warning signs. Instructed to call office or come to hospital with persistent headache, vision changes, regular contractions, leaking of fluid, decreased fetal movement or vaginal bleeding.     No orders of the defined types were placed in this encounter.  Return in about 2 weeks (around 03/22/2024).   Future Appointments  Date Time Provider Department Center  03/22/2024  3:35 PM Alise Appl, CNM [facility] None    For next visit:  Routine prenatal care    Subjective   Lori Stevenson has been having eczema flares. She also reports low back pain. She is doing well otherwise.  Movement: Present Contractions: Irritability  Objective   Flow sheet Vitals: Pulse Rate: 89 BP: 102/66 Total weight gain: 12 lb (5.443 kg)  General Appearance  No acute distress, well appearing, and well nourished Pulmonary   Normal work of breathing Neurologic   Alert and oriented to person, place, and time Psychiatric   Mood and affect within normal limits  Josue Nip, CNM 03/08/24 4:26 PM

## 2024-03-22 ENCOUNTER — Ambulatory Visit (INDEPENDENT_AMBULATORY_CARE_PROVIDER_SITE_OTHER): Admitting: Certified Nurse Midwife

## 2024-03-22 ENCOUNTER — Encounter: Payer: Self-pay | Admitting: Certified Nurse Midwife

## 2024-03-22 VITALS — BP 102/68 | HR 75 | Wt 153.5 lb

## 2024-03-22 DIAGNOSIS — Z3480 Encounter for supervision of other normal pregnancy, unspecified trimester: Secondary | ICD-10-CM

## 2024-03-22 DIAGNOSIS — Z3A32 32 weeks gestation of pregnancy: Secondary | ICD-10-CM

## 2024-03-22 NOTE — Progress Notes (Signed)
 ROB doing well. She is having some braxton hicks and pelvic pressure. Reviewed Preterm labor vs braxton hicks. She verbalizes understanding. She is using the pregnancy belt which does help. Reassurance given. Pt has questions about Medicaid for this baby and request to speak to Child psychotherapist. Jenna present in office today , into see pt.   Pt to follow up 2 weeks for ROB.   Alise Appl, CNM

## 2024-03-22 NOTE — Patient Instructions (Signed)
 Braxton Hicks Contractions: Self-Care Braxton Hicks contractions may feel like labor contractions, but they're like practice for real labor. They can start when you're halfway through your pregnancy. During the last 2 months of your pregnancy, these contractions may happen more and hurt more. However, they aren't a sign that you're in real labor. What are Deberah Pelton contractions? Braxton Hicks contractions make your belly muscles tighten. They aren't real labor because they don't make your cervix, which is the lowest part of your uterus, open and thin. This tightening of your belly before labor starts can also be called false labor. How to tell the difference between true labor and false labor True labor contractions Last 60-90 seconds. Happen on a pattern. Get stronger and last longer over time. Don't go away when you: Walk. Change positions. Rest. Discomfort often begins in the back and then moves to the front. The cervix opens and thins. False labor contractions Are weak and last a short time. Don't happen on a pattern. Happen farther apart than true labor. May go away when you: Walk. Change positions. Rest. May be noticed more at the end of the day. Discomfort is often only felt in the front of the belly. The cervix usually does not open or thin. Sometimes, the only way to tell if you're in true labor is for your health care provider to check your cervix. Your provider will do a physical exam and may monitor your contractions. If you're in true labor, your provider may send you home with instructions about when to return to the hospital or may send you to the hospital right away. Follow these instructions at home:  Take your medicines only as told. Drink more fluids as told. Dehydration may cause these contractions. Dehydration is when there's not enough water in your body. If Braxton Hicks contractions are making you uncomfortable: Change your position or activity. Sit and  rest in a tub of warm water. Do slow and deep breathing several times an hour. Keep all follow-up visits. Your provider will need to check your health and your baby's health. Contact a health care provider if: Your contractions become: Stronger. More regular. Closer together. You have mucus from your vagina that has blood in it. Get help right away if: You feel your baby moving less than usual. You have any amount of fluid that flows from your vagina without stopping. You have signs or symptoms of labor before 37 weeks of pregnancy, such as: Contractions that are 5 minutes or less apart, or that get stronger and last longer. Sudden, sharp pain in your belly or lower back. These symptoms may be an emergency. Call 911 right away. Do not wait to see if the symptoms will go away. Do not drive yourself to the hospital. This information is not intended to replace advice given to you by your health care provider. Make sure you discuss any questions you have with your health care provider. Document Revised: 06/03/2023 Document Reviewed: 06/03/2023 Elsevier Patient Education  2024 [employer].

## 2024-04-04 ENCOUNTER — Other Ambulatory Visit: Payer: Self-pay

## 2024-04-04 ENCOUNTER — Encounter: Payer: Self-pay | Admitting: Obstetrics and Gynecology

## 2024-04-04 ENCOUNTER — Observation Stay
Admission: EM | Admit: 2024-04-04 | Discharge: 2024-04-04 | Disposition: A | Discharge status: Home / Self Care | Attending: Certified Nurse Midwife | Admitting: Certified Nurse Midwife

## 2024-04-04 DIAGNOSIS — O99891 Other specified diseases and conditions complicating pregnancy: Secondary | ICD-10-CM | POA: Diagnosis not present

## 2024-04-04 DIAGNOSIS — N898 Other specified noninflammatory disorders of vagina: Secondary | ICD-10-CM

## 2024-04-04 DIAGNOSIS — O4193X0 Disorder of amniotic fluid and membranes, unspecified, third trimester, not applicable or unspecified: Principal | ICD-10-CM | POA: Insufficient documentation

## 2024-04-04 DIAGNOSIS — Z3A34 34 weeks gestation of pregnancy: Secondary | ICD-10-CM

## 2024-04-04 DIAGNOSIS — Z3480 Encounter for supervision of other normal pregnancy, unspecified trimester: Principal | ICD-10-CM

## 2024-04-04 LAB — RUPTURE OF MEMBRANE (ROM)PLUS: Rom Plus: NEGATIVE

## 2024-04-04 MED ORDER — ONDANSETRON 4 MG PO TBDP: 4.0000 mg | ORAL_TABLET | Freq: Once | ORAL | Status: DC

## 2024-04-04 NOTE — OB Triage Provider Note (Signed)
 L&D OB Triage Note  SUBJECTIVE Lori Stevenson is a 28 y.o. G2P1001 female at [redacted]w[redacted]d, EDD Estimated Date of Delivery: 05/15/24 who presented to triage with complaints of gush of fluid. States she vomited x 1 this morning the had a gush of fluid. She denies nausea currently . State she felt better after vomiting. She denies continued leaking of fluid. She has had some braxton hicks ctx but denies labor contractions and is feeling good movement.    OB History  Gravida Para Term Preterm AB Living  2 1 1  0 0 1  SAB IAB Ectopic Multiple Live Births  0 0 0 0 1    # Outcome Date GA Lbr Len/2nd Weight Sex Type Anes PTL Lv  2 Current           1 Term 07/26/22 [redacted]w[redacted]d / 02:53 2820 g M Vag-Vacuum EPI, Local  LIV     Name: Stevenson,Lori Stevenson Lori     Apgar1: 8  Apgar5: 9    Medications Prior to Admission  Medication Sig Dispense Refill Last Dose/Taking   albuterol  (PROVENTIL ) (2.5 MG/3ML) 0.083% nebulizer solution Take 3 mLs (2.5 mg total) by nebulization every 6 (six) hours as needed for wheezing or shortness of breath. 75 mL 12    albuterol  (VENTOLIN  HFA) 108 (90 Base) MCG/ACT inhaler Inhale 2 puffs into the lungs every 4 (four) hours as needed. 6.7 g 2    ferrous sulfate 325 (65 FE) MG EC tablet Take 325 mg by mouth 2 (two) times daily.      ipratropium (ATROVENT ) 0.06 % nasal spray Place 2 sprays into both nostrils 4 (four) times daily. (Patient not taking: Reported on 10/01/2023) 15 mL 0    ondansetron  (ZOFRAN -ODT) 4 MG disintegrating tablet Take 1 tablet (4 mg total) by mouth every 8 (eight) hours as needed for nausea or vomiting. 20 tablet 2    Prenatal Vit-Fe Fumarate-FA (PRENATAL PO) Take by mouth.      pyridOXINE (VITAMIN B6) 25 MG tablet Take 25 mg by mouth daily.      sulfamethoxazole -trimethoprim  (BACTRIM  DS) 800-160 MG tablet Take 1 tablet by mouth 2 (two) times daily. (Patient not taking: Reported on 03/22/2024) 14 tablet 1    triamcinolone  ointment (KENALOG ) 0.5 % Apply 1  Application topically 2 (two) times daily. 30 g 0      OBJECTIVE  Nursing Evaluation:   BP 107/69 (BP Location: Left Arm)   Pulse 86   Temp 97.9 F (36.6 C) (Oral)   Resp 15   Ht 4\' 10"  (1.473 m)   Wt 69.4 kg   LMP 08/09/2023 (Exact Date)   BMI 31.98 kg/m    Findings:        ROM plus Negative       NST was performed and has been reviewed by me.  NST INTERPRETATION: Category I  Mode: External Baseline Rate (A): 120 bpm Variability: Moderate Accelerations: 15 x 15 Decelerations: None     Contraction Frequency (min): ui  ASSESSMENT Impression:  1.  Pregnancy:  G2P1001 at [redacted]w[redacted]d , EDD Estimated Date of Delivery: 05/15/24 2.  Reassuring fetal and maternal status 3.  ROM plus negative for rupture  PLAN 1. Current condition and above findings reviewed.  Reassuring fetal and maternal condition. 2. Discharge home with standard labor precautions given to return to L&D or call the office for problems. 3. Continue routine prenatal care.  I was present and evaluated this pt in person.   Alise Appl, CNM

## 2024-04-04 NOTE — OB Triage Note (Signed)
 Patient is a G2P1001 at [redacted]w[redacted]d who presents to unit c/o a large gush of fluid after a vomiting episode this morning. Reports +fetal movement, occasional Braxton Hicks contractions, denies vaginal bleeding. External monitors applied and assessing. Initial FHT 135. Vital signs WDL.

## 2024-04-04 NOTE — OB Triage Note (Signed)
 Discharge instructions, labor precautions, and follow-up care reviewed with patient and significant other. All questions answered. Patient verbalized understanding. Discharged ambulatory off unit.

## 2024-04-05 ENCOUNTER — Encounter: Payer: Self-pay | Admitting: Certified Nurse Midwife

## 2024-04-05 ENCOUNTER — Ambulatory Visit (INDEPENDENT_AMBULATORY_CARE_PROVIDER_SITE_OTHER): Admitting: Certified Nurse Midwife

## 2024-04-05 VITALS — BP 101/66 | HR 79 | Wt 156.6 lb

## 2024-04-05 DIAGNOSIS — O219 Vomiting of pregnancy, unspecified: Secondary | ICD-10-CM

## 2024-04-05 DIAGNOSIS — Z3A34 34 weeks gestation of pregnancy: Secondary | ICD-10-CM

## 2024-04-05 DIAGNOSIS — O99513 Diseases of the respiratory system complicating pregnancy, third trimester: Secondary | ICD-10-CM | POA: Diagnosis not present

## 2024-04-05 DIAGNOSIS — J4521 Mild intermittent asthma with (acute) exacerbation: Secondary | ICD-10-CM | POA: Diagnosis not present

## 2024-04-05 DIAGNOSIS — Z3480 Encounter for supervision of other normal pregnancy, unspecified trimester: Secondary | ICD-10-CM

## 2024-04-05 NOTE — Progress Notes (Signed)
    Return Prenatal Note   Subjective   28 y.o. G2P1001 at [redacted]w[redacted]d presents for this follow-up prenatal visit.  Patient increased shortness of breath. Requesting a refill on her inhaler and zofran .  Patient reports: Movement: Present Contractions: Regular  Objective   Flow sheet Vitals: Pulse Rate: 79 BP: 101/66 Total weight gain: 19 lb 9.6 oz (8.891 kg)  General Appearance  No acute distress, well appearing, and well nourished Pulmonary   Normal work of breathing Neurologic   Alert and oriented to person, place, and time Psychiatric   Mood and affect within normal limits  Assessment/Plan   Plan  28 y.o. G2P1001 at [redacted]w[redacted]d presents for follow-up OB visit. Reviewed prenatal record including previous visit note.  Supervision of other normal pregnancy, antepartum Feeling well overall.  Reviewed kick counts and preterm labor warning signs. Instructed to call office or come to hospital with persistent headache, vision changes, regular contractions, leaking of fluid, decreased fetal movement or vaginal bleeding.   Intermittent asthma Having some shortness of breath. Needs inhaler refilled. Will let us  know if she starts needing it every day. Discussed potentially adding another medication to support her asthma.       No orders of the defined types were placed in this encounter.  No follow-ups on file.   Future Appointments  Date Time Provider Department Center  04/19/2024 10:55 AM Dominic, Alva Jewels, CNM [facility] None    For next visit:  continue with routine prenatal care    Donato Fu, CNM [facility] 06/03/259:40 AM

## 2024-04-06 ENCOUNTER — Telehealth: Payer: Self-pay

## 2024-04-06 MED ORDER — ALBUTEROL SULFATE HFA 108 (90 BASE) MCG/ACT IN AERS: 2.0000 | INHALATION_SPRAY | RESPIRATORY_TRACT | 2 refills | Status: DC | PRN

## 2024-04-06 MED ORDER — ONDANSETRON 4 MG PO TBDP: 4.0000 mg | ORAL_TABLET | Freq: Three times a day (TID) | ORAL | 2 refills | Status: DC | PRN

## 2024-04-06 NOTE — Assessment & Plan Note (Signed)
 Feeling well overall.  Reviewed kick counts and preterm labor warning signs. Instructed to call office or come to hospital with persistent headache, vision changes, regular contractions, leaking of fluid, decreased fetal movement or vaginal bleeding.

## 2024-04-06 NOTE — Assessment & Plan Note (Signed)
 Having some shortness of breath. Needs inhaler refilled. Will let us  know if she starts needing it every day. Discussed potentially adding another medication to support her asthma.

## 2024-04-06 NOTE — Telephone Encounter (Signed)
 Patient called about her prescriptions for her Albuterol  inhaler and Zofran . I called patient back to let her know that prescriptions have been sent and to contact her pharmacy for pickup, no answer, left a VM.

## 2024-04-19 ENCOUNTER — Encounter: Payer: Self-pay | Admitting: Licensed Practical Nurse

## 2024-04-19 ENCOUNTER — Other Ambulatory Visit (HOSPITAL_COMMUNITY)
Admission: RE | Admit: 2024-04-19 | Discharge: 2024-04-19 | Disposition: A | Discharge status: Home / Self Care | Source: Ambulatory Visit | Attending: Licensed Practical Nurse | Admitting: Licensed Practical Nurse

## 2024-04-19 ENCOUNTER — Ambulatory Visit (INDEPENDENT_AMBULATORY_CARE_PROVIDER_SITE_OTHER): Admitting: Licensed Practical Nurse

## 2024-04-19 VITALS — BP 98/77 | HR 76 | Wt 161.0 lb

## 2024-04-19 DIAGNOSIS — Z3A36 36 weeks gestation of pregnancy: Secondary | ICD-10-CM | POA: Diagnosis present

## 2024-04-19 DIAGNOSIS — Z3483 Encounter for supervision of other normal pregnancy, third trimester: Secondary | ICD-10-CM

## 2024-04-19 DIAGNOSIS — Z3480 Encounter for supervision of other normal pregnancy, unspecified trimester: Secondary | ICD-10-CM

## 2024-04-19 DIAGNOSIS — Z113 Encounter for screening for infections with a predominantly sexual mode of transmission: Secondary | ICD-10-CM | POA: Insufficient documentation

## 2024-04-19 DIAGNOSIS — Z3685 Encounter for antenatal screening for Streptococcus B: Secondary | ICD-10-CM

## 2024-04-19 NOTE — Progress Notes (Signed)
    Return Prenatal Note   Subjective   28 y.o. G2P1001 at [redacted]w[redacted]d presents for this follow-up prenatal visit.  Patient doing well, no concerns Patient reports: pelvic discomfort with movements and feeling like legs are going to give out when standing.  Movement: Present Contractions: Irritability  Objective   Flow sheet Vitals: Pulse Rate: 76 BP: 98/77 Fundal Height: 36 cm Fetal Heart Rate (bpm): 140 Presentation: Vertex Total weight gain: 24 lb (10.9 kg)  General Appearance  No acute distress, well appearing, and well nourished Pulmonary   Normal work of breathing Neurologic   Alert and oriented to person, place, and time Psychiatric   Mood and affect within normal limits  Assessment/Plan   Plan  28 y.o. G2P1001 at [redacted]w[redacted]d presents for follow-up OB visit. Reviewed prenatal record including previous visit note.  Supervision of other normal pregnancy, antepartum - Reports feeling a pop when rolling out of bed in the morning and general pelvic discomfort. Recommended maternity belt, belly tape and reassured common symptom at this stage in pregnancy.  -Reports some leg pain at the hips after sitting for long periods. Advised to walk every 2 hours at work, elevating legs. Warning signs reviewed.  - Uses albuterol  inhaler about once a month.  -birth plan: epidural, has plan in place for 49 year old son when in labor.  - has breastpump, planning to breast and bottle feed(formula). Had complications with breastfeeding and newborn weight with first baby. Advised to speak with lactation consultation prior to discharge. Reviewed breastfeeding education.  -Collected GBS swap today.  - Confirmed Vertex via ultrasound today.  -Labor precautions and warning signs reviewed.  - R/O in 1 week.       Orders Placed This Encounter  Procedures   Strep Gp B NAA   Return in about 1 week (around 04/26/2024) for ROB.   Future Appointments  Date Time Provider Department Center  04/28/2024   3:15 PM [name], CNM [facility] None    For next visit:  continue with routine prenatal care     Berkley Breech [facility], CNM  06/16/253:26 PM

## 2024-04-19 NOTE — Assessment & Plan Note (Signed)
-   Reports feeling a pop when rolling out of bed in the morning and general pelvic discomfort. Recommended maternity belt, belly tape and reassured common symptom at this stage in pregnancy.  -Reports some leg pain at the hips after sitting for long periods. Advised to walk every 2 hours at work, elevating legs. Warning signs reviewed.  - Uses albuterol  inhaler about once a month.  -birth plan: epidural, has plan in place for 27 year old son when in labor.  - has breastpump, planning to breast and bottle feed(formula). Had complications with breastfeeding and newborn weight with first baby. Advised to speak with lactation consultation prior to discharge. Reviewed breastfeeding education.  -Collected GBS swap today.  - Confirmed Vertex via ultrasound today.  -Labor precautions and warning signs reviewed.  - R/O in 1 week.

## 2024-04-20 LAB — CERVICOVAGINAL ANCILLARY ONLY
Chlamydia: NEGATIVE
Comment: NEGATIVE
Comment: NORMAL
Neisseria Gonorrhea: NEGATIVE

## 2024-04-21 LAB — STREP GP B NAA: Strep Gp B NAA: NEGATIVE

## 2024-04-28 ENCOUNTER — Ambulatory Visit: Admitting: Certified Nurse Midwife

## 2024-04-28 ENCOUNTER — Encounter: Payer: Self-pay | Admitting: Certified Nurse Midwife

## 2024-04-28 VITALS — BP 108/68 | HR 92 | Wt 162.6 lb

## 2024-04-28 DIAGNOSIS — Z3483 Encounter for supervision of other normal pregnancy, third trimester: Secondary | ICD-10-CM

## 2024-04-28 DIAGNOSIS — Z3480 Encounter for supervision of other normal pregnancy, unspecified trimester: Secondary | ICD-10-CM

## 2024-04-28 DIAGNOSIS — Z0289 Encounter for other administrative examinations: Secondary | ICD-10-CM

## 2024-04-28 DIAGNOSIS — Z3A37 37 weeks gestation of pregnancy: Secondary | ICD-10-CM

## 2024-04-28 NOTE — Patient Instructions (Signed)
 Third Trimester of Pregnancy  The third trimester of pregnancy is from week 28 through week 40. This is months 7 through 9. The third trimester is a time when your baby is growing fast. Body changes during your third trimester Your body continues to change during this time. The changes usually go away after your baby is born. Physical changes You will continue to gain weight. You may get stretch marks on your hips, belly, and breasts. Your breasts will keep growing and may hurt. A yellow fluid (colostrum) may leak from your breasts. This is the first milk you're making for your baby. Your hair may grow faster and get thicker. In some cases, you may get hair loss. Your belly button may stick out. You may have more swelling in your hands, face, or ankles. Health changes You may have heartburn. You may feel short of breath. This is caused by the uterus that is now bigger. You may have more aches in the pelvis, back, or thighs. You may have more tingling or numbness in your hands, arms, and legs. You may pee more often. You may have trouble pooping (constipation) or swollen veins in the butt that can itch or get painful (hemorrhoids). Other changes You may have more problems sleeping. You may notice the baby moving lower in your belly (dropping). You may have more fluid coming from your vagina. Your joints may feel loose, and you may have pain around your pelvic bone. Follow these instructions at home: Medicines Take medicines only as told by your health care provider. Some medicines are not safe during pregnancy. Your provider may change the medicines that you take. Do not take any medicines unless told to by your provider. Take a prenatal vitamin that has at least 600 micrograms (mcg) of folic acid. Do not use herbal medicines, illegal drugs, or medicines that are not approved by your provider. Eating and drinking While you're pregnant your body needs additional nutrition to help  support your growing baby. Talk with your provider about your nutritional needs. Activity Most women are able to exercise regularly during pregnancy. Exercise routines may need to change at the end of your pregnancy. Talk to your provider about your activities and exercise routine. Relieving pain and discomfort Rest often with your legs raised if you have leg cramps or low back pain. Take warm sitz baths to soothe pain from hemorrhoids. Use hemorrhoid cream if your provider says it's okay. Wear a good, supportive bra if your breasts hurt. Do not use hot tubs, steam rooms, or saunas. Do not douche. Do not use tampons or scented pads. Safety Talk to your provider before traveling far distances. Wear your seatbelt at all times when you're in a car. Talk to your provider if someone hits you, hurts you, or yells at you. Preparing for birth To prepare for your baby: Take childbirth and breastfeeding classes. Visit the hospital and tour the maternity area. Buy a rear-facing car seat. Learn how to install it in your car. General instructions Avoid cat litter boxes and soil used by cats. These things carry germs that can cause harm to your pregnancy and your baby. Do not drink alcohol, smoke, vape, or use products with nicotine or tobacco in them. If you need help quitting, talk with your provider. Keep all follow-up visits for your third trimester. Your provider will do more exams and tests during this trimester. Write down your questions. Take them to your prenatal visits. Your provider also will: Talk with you about  your overall health. Give you advice or refer you to specialists who can help with different needs, including: Mental health and counseling. Foods and healthy eating. Ask for help if you need help with food. Where to find more information American Pregnancy Association: americanpregnancy.org [employer] of Obstetricians and Gynecologists: acog.org Office on [employer] Health:  [employer] Contact a health care provider if: You have a headache that does not go away when you take medicine. You have any of these problems: You can't eat or drink. You have nausea and vomiting. You have watery poop (diarrhea) for 2 days or more. You have pain when you pee, or your pee smells bad. You have been sick for 2 days or more and aren't getting better. Contact your provider right away if: You have any of these coming from your vagina: Abnormal discharge. Bad-smelling fluid. Bleeding. Your baby is moving less than usual. You have signs of labor: You have any contractions, belly cramping, or have pain in your pelvis or lower back before 37 weeks of pregnancy (preterm labor). You have regular contractions that are less than 5 minutes apart. Your water breaks. You have symptoms of high blood pressure or preeclampsia. These include: A severe, throbbing headache that does not go away. Sudden or extreme swelling of your face, hands, legs, or feet. Vision problems: You see spots. You have blurry vision. Your eyes are sensitive to light. If you can't reach your provider, go to an urgent care or emergency room. Get help right away if: You faint, become confused, or can't think clearly. You have chest pain or trouble breathing. You have any kind of injury, such as from a fall or a car crash. These symptoms may be an emergency. Call 911 right away. Do not wait to see if the symptoms will go away. Do not drive yourself to the hospital. This information is not intended to replace advice given to you by your health care provider. Make sure you discuss any questions you have with your health care provider. Document Revised: 07/24/2023 Document Reviewed: 02/21/2023 Elsevier Patient Education  2024 [employer].

## 2024-04-28 NOTE — Assessment & Plan Note (Signed)
 Reviewed kick counts and preterm labor warning signs. Instructed to call office or come to hospital with persistent headache, vision changes, regular contractions, leaking of fluid, decreased fetal movement or vaginal bleeding.

## 2024-04-28 NOTE — Progress Notes (Signed)
    Return Prenatal Note   Subjective   28 y.o. G2P1001 at [redacted]w[redacted]d presents for this follow-up prenatal visit.  Patient feeling more pressure & BH contractions, would like SVE today. Aware GBS negative. Patient reports: Movement: Present Contractions: Regular  Objective   Flow sheet Vitals: Pulse Rate: 92 BP: 108/68 Fundal Height: 37 cm Fetal Heart Rate (bpm): 135 Presentation: Vertex Dilation: 1 Effacement (%): 50 Station: -1 Total weight gain: 25 lb 9.6 oz (11.6 kg)  General Appearance  No acute distress, well appearing, and well nourished Pulmonary   Normal work of breathing Neurologic   Alert and oriented to person, place, and time Psychiatric   Mood and affect within normal limits  Assessment/Plan   Plan  28 y.o. G2P1001 at [redacted]w[redacted]d presents for follow-up OB visit. Reviewed prenatal record including previous visit note.  Supervision of other normal pregnancy, antepartum Reviewed kick counts and preterm labor warning signs. Instructed to call office or come to hospital with persistent headache, vision changes, regular contractions, leaking of fluid, decreased fetal movement or vaginal bleeding.      No orders of the defined types were placed in this encounter.  Return in about 1 week (around 05/05/2024) for ROB.   Future Appointments  Date Time Provider Department Center  05/04/2024  4:15 PM Jayne Harlene CROME, CNM [facility] None    For next visit:  continue with routine prenatal care     Harlene CROME Jayne, CNM  06/27/273:36 PM

## 2024-05-01 ENCOUNTER — Inpatient Hospital Stay
Admission: EM | Admit: 2024-05-01 | Discharge: 2024-05-02 | DRG: 807 | Disposition: A | Discharge status: Home / Self Care | Attending: Certified Nurse Midwife | Admitting: Certified Nurse Midwife

## 2024-05-01 ENCOUNTER — Encounter: Payer: Self-pay | Admitting: Obstetrics

## 2024-05-01 ENCOUNTER — Other Ambulatory Visit: Payer: Self-pay

## 2024-05-01 DIAGNOSIS — Z6791 Unspecified blood type, Rh negative: Secondary | ICD-10-CM | POA: Diagnosis not present

## 2024-05-01 DIAGNOSIS — O9952 Diseases of the respiratory system complicating childbirth: Secondary | ICD-10-CM | POA: Diagnosis present

## 2024-05-01 DIAGNOSIS — O26899 Other specified pregnancy related conditions, unspecified trimester: Secondary | ICD-10-CM

## 2024-05-01 DIAGNOSIS — Z8709 Personal history of other diseases of the respiratory system: Secondary | ICD-10-CM

## 2024-05-01 DIAGNOSIS — J45909 Unspecified asthma, uncomplicated: Secondary | ICD-10-CM

## 2024-05-01 DIAGNOSIS — O26893 Other specified pregnancy related conditions, third trimester: Secondary | ICD-10-CM | POA: Diagnosis present

## 2024-05-01 DIAGNOSIS — Z3A38 38 weeks gestation of pregnancy: Secondary | ICD-10-CM | POA: Diagnosis not present

## 2024-05-01 DIAGNOSIS — J4520 Mild intermittent asthma, uncomplicated: Secondary | ICD-10-CM | POA: Diagnosis present

## 2024-05-01 DIAGNOSIS — Z87891 Personal history of nicotine dependence: Secondary | ICD-10-CM

## 2024-05-01 DIAGNOSIS — Z3480 Encounter for supervision of other normal pregnancy, unspecified trimester: Secondary | ICD-10-CM

## 2024-05-01 LAB — CBC
HCT: 33.8 % — ABNORMAL LOW (ref 36.0–46.0)
Hemoglobin: 11 g/dL — ABNORMAL LOW (ref 12.0–15.0)
MCH: 26 pg (ref 26.0–34.0)
MCHC: 32.5 g/dL (ref 30.0–36.0)
MCV: 79.9 fL — ABNORMAL LOW (ref 80.0–100.0)
Platelets: 197 10*3/uL (ref 150–400)
RBC: 4.23 MIL/uL (ref 3.87–5.11)
RDW: 14.1 % (ref 11.5–15.5)
WBC: 11.5 10*3/uL — ABNORMAL HIGH (ref 4.0–10.5)
nRBC: 0 % (ref 0.0–0.2)

## 2024-05-01 LAB — TYPE AND SCREEN
ABO/RH(D): O NEG
Antibody Screen: POSITIVE

## 2024-05-01 MED ORDER — MISOPROSTOL 200 MCG PO TABS
ORAL_TABLET | ORAL | Status: DC
Start: 2024-05-01 — End: 2024-05-01
  Filled 2024-05-01: qty 4

## 2024-05-01 MED ORDER — COCONUT OIL OIL: 1.0000 | TOPICAL_OIL | Status: DC | PRN

## 2024-05-01 MED ORDER — AMMONIA AROMATIC IN INHA
RESPIRATORY_TRACT | Status: AC
  Filled 2024-05-01: qty 10

## 2024-05-01 MED ORDER — IBUPROFEN 600 MG PO TABS
600.0000 mg | ORAL_TABLET | Freq: Four times a day (QID) | ORAL | Status: DC
  Administered 2024-05-01 – 2024-05-02 (×6): 600 mg via ORAL
  Filled 2024-05-01 (×6): qty 1

## 2024-05-01 MED ORDER — ONDANSETRON HCL 4 MG/2ML IJ SOLN: 4.0000 mg | Freq: Four times a day (QID) | INTRAMUSCULAR | Status: DC | PRN

## 2024-05-01 MED ORDER — ACETAMINOPHEN 325 MG PO TABS
650.0000 mg | ORAL_TABLET | ORAL | Status: DC | PRN
  Administered 2024-05-02: 650 mg via ORAL
  Filled 2024-05-01 (×3): qty 2

## 2024-05-01 MED ORDER — OXYCODONE HCL 5 MG PO TABS: 5.0000 mg | ORAL_TABLET | ORAL | Status: DC | PRN

## 2024-05-01 MED ORDER — OXYTOCIN-SODIUM CHLORIDE 30-0.9 UT/500ML-% IV SOLN: 2.5000 [IU]/h | INTRAVENOUS | Status: DC

## 2024-05-01 MED ORDER — SOD CITRATE-CITRIC ACID 500-334 MG/5ML PO SOLN: 30.0000 mL | ORAL | Status: DC | PRN

## 2024-05-01 MED ORDER — METHYLERGONOVINE MALEATE 0.2 MG PO TABS: 0.2000 mg | ORAL_TABLET | ORAL | Status: DC | PRN

## 2024-05-01 MED ORDER — SENNOSIDES-DOCUSATE SODIUM 8.6-50 MG PO TABS
2.0000 | ORAL_TABLET | ORAL | Status: DC
Start: 2024-05-01 — End: 2024-05-02
  Administered 2024-05-01: 2 via ORAL
  Filled 2024-05-01: qty 2

## 2024-05-01 MED ORDER — ONDANSETRON HCL 4 MG/2ML IJ SOLN
4.0000 mg | INTRAMUSCULAR | Status: DC | PRN
Start: 2024-05-01 — End: 2024-05-02

## 2024-05-01 MED ORDER — COCONUT OIL OIL
1.0000 | TOPICAL_OIL | Status: DC | PRN
  Filled 2024-05-01: qty 15

## 2024-05-01 MED ORDER — LIDOCAINE HCL (PF) 1 % IJ SOLN
30.0000 mL | INTRAMUSCULAR | Status: AC | PRN
  Administered 2024-05-01: 30 mL via SUBCUTANEOUS

## 2024-05-01 MED ORDER — OXYTOCIN 10 UNIT/ML IJ SOLN
INTRAMUSCULAR | Status: AC
  Filled 2024-05-01: qty 2

## 2024-05-01 MED ORDER — OXYTOCIN BOLUS FROM INFUSION
333.0000 mL | Freq: Once | INTRAVENOUS | Status: DC
Start: 2024-05-01 — End: 2024-05-02

## 2024-05-01 MED ORDER — LIDOCAINE HCL (PF) 1 % IJ SOLN
INTRAMUSCULAR | Status: AC
  Filled 2024-05-01: qty 30

## 2024-05-01 MED ORDER — OXYTOCIN 10 UNIT/ML IJ SOLN
10.0000 [IU] | Freq: Once | INTRAMUSCULAR | Status: AC
Start: 2024-05-01 — End: 2024-05-02
  Administered 2024-05-01: 10 [IU] via INTRAMUSCULAR

## 2024-05-01 MED ORDER — BENZOCAINE-MENTHOL 20-0.5 % EX AERO
1.0000 | INHALATION_SPRAY | CUTANEOUS | Status: DC | PRN
  Administered 2024-05-01 (×2): 1 via TOPICAL
  Filled 2024-05-01 (×2): qty 56

## 2024-05-01 MED ORDER — LACTATED RINGERS IV SOLN: INTRAVENOUS | Status: DC

## 2024-05-01 MED ORDER — DOCUSATE SODIUM 100 MG PO CAPS
100.0000 mg | ORAL_CAPSULE | Freq: Two times a day (BID) | ORAL | Status: DC
  Administered 2024-05-02: 100 mg via ORAL
  Filled 2024-05-01: qty 1

## 2024-05-01 MED ORDER — OXYCODONE HCL 5 MG PO TABS: 10.0000 mg | ORAL_TABLET | ORAL | Status: DC | PRN

## 2024-05-01 MED ORDER — METHYLERGONOVINE MALEATE 0.2 MG/ML IJ SOLN: 0.2000 mg | INTRAMUSCULAR | Status: DC | PRN

## 2024-05-01 MED ORDER — LACTATED RINGERS IV SOLN: 500.0000 mL | INTRAVENOUS | Status: DC | PRN

## 2024-05-01 MED ORDER — WITCH HAZEL-GLYCERIN EX PADS
1.0000 | MEDICATED_PAD | CUTANEOUS | Status: DC | PRN
  Administered 2024-05-01: 1 via TOPICAL
  Filled 2024-05-01: qty 100

## 2024-05-01 MED ORDER — SIMETHICONE 80 MG PO CHEW: 80.0000 mg | CHEWABLE_TABLET | ORAL | Status: DC | PRN

## 2024-05-01 MED ORDER — ALBUTEROL SULFATE (2.5 MG/3ML) 0.083% IN NEBU
2.5000 mg | INHALATION_SOLUTION | Freq: Four times a day (QID) | RESPIRATORY_TRACT | Status: DC | PRN
  Administered 2024-05-01: 2.5 mg via RESPIRATORY_TRACT
  Filled 2024-05-01: qty 3

## 2024-05-01 MED ORDER — FERROUS SULFATE 325 (65 FE) MG PO TABS
325.0000 mg | ORAL_TABLET | Freq: Every day | ORAL | Status: DC
  Administered 2024-05-02: 325 mg via ORAL
  Filled 2024-05-01: qty 1

## 2024-05-01 MED ORDER — PRENATAL MULTIVITAMIN CH
1.0000 | ORAL_TABLET | Freq: Every day | ORAL | Status: DC
  Administered 2024-05-02: 1 via ORAL
  Filled 2024-05-01: qty 1

## 2024-05-01 MED ORDER — OXYCODONE-ACETAMINOPHEN 5-325 MG PO TABS: 1.0000 | ORAL_TABLET | ORAL | Status: DC | PRN

## 2024-05-01 MED ORDER — OXYTOCIN-SODIUM CHLORIDE 30-0.9 UT/500ML-% IV SOLN
INTRAVENOUS | Status: DC
Start: 2024-05-01 — End: 2024-05-01
  Filled 2024-05-01: qty 500

## 2024-05-01 MED ORDER — OXYCODONE-ACETAMINOPHEN 5-325 MG PO TABS: 2.0000 | ORAL_TABLET | ORAL | Status: DC | PRN

## 2024-05-01 MED ORDER — ACETAMINOPHEN 325 MG PO TABS
650.0000 mg | ORAL_TABLET | ORAL | Status: DC | PRN
  Administered 2024-05-01 – 2024-05-02 (×3): 650 mg via ORAL
  Filled 2024-05-01: qty 2

## 2024-05-01 MED ORDER — ONDANSETRON HCL 4 MG PO TABS: 4.0000 mg | ORAL_TABLET | ORAL | Status: DC | PRN

## 2024-05-01 MED ORDER — DIBUCAINE (PERIANAL) 1 % EX OINT
1.0000 | TOPICAL_OINTMENT | CUTANEOUS | Status: DC | PRN
  Administered 2024-05-01 – 2024-05-02 (×2): 1 via RECTAL
  Filled 2024-05-01 (×2): qty 28

## 2024-05-01 NOTE — OB Triage Note (Signed)
 Patient is a G2P1001 at [redacted]w[redacted]d who presents to unit c/o contractions since around 0400. Reports +fetal movement, denies vaginal bleeding and leakage of fluid.

## 2024-05-01 NOTE — Lactation Note (Signed)
 This note was copied from a baby's chart. Lactation Consultation Note  Patient Name: Lori Stevenson Unijb'd Date: 05/01/2024 Age:27 hours Reason for consult: L&D Initial assessment;1st time breastfeeding;Early term 37-38.6wks;Breastfeeding assistance   Maternal Data Has patient been taught Hand Expression?: Yes Does the patient have breastfeeding experience prior to this delivery?: Yes How long did the patient breastfeed?: 2 mths  Feeding Mother's Current Feeding Choice: Breast Milk Assisted with first breastfeeding after quick delivery, baby's temp 97.8, so attention given to maintaining warmth, baby fussy when moved so couldn't coordinate latch at first in cradle hold, moved to football hold on right breast, was able to latch well, then began falling asleep but when stimulated would nurse for short periods then fall asleep, if temp ok at next check, will offer 2nd breast.   LATCH Score Latch: Repeated attempts needed to sustain latch, nipple held in mouth throughout feeding, stimulation needed to elicit sucking reflex. (latches well, rooting present, fussy)  Audible Swallowing: A few with stimulation  Type of Nipple: Everted at rest and after stimulation  Comfort (Breast/Nipple): Soft / non-tender  Hold (Positioning): Assistance needed to correctly position infant at breast and maintain latch.  LATCH Score: 7   Lactation Tools Discussed/Used    Interventions Interventions: Breast feeding basics reviewed;Assisted with latch;Skin to skin;Hand express;Adjust position;Support pillows;Position options;Education  Discharge Pump: Personal WIC Program: No  Consult Status Consult Status: Follow-up from L&D Date: 05/01/24 Follow-up type: In-patient    [name] 05/01/2024, 10:23 AM

## 2024-05-01 NOTE — Lactation Note (Addendum)
 This note was copied from a baby's chart. Lactation Consultation Note  Patient Name: Boy Cheyrl Buley Unijb'd Date: 05/01/2024 Age:27 hours Reason for consult: Follow-up assessment;Breastfeeding assistance   Maternal Data Has patient been taught Hand Expression?: Yes Does the patient have breastfeeding experience prior to this delivery?: Yes How long did the patient breastfeed?: 2 mths  Feeding Mother's Current Feeding Choice: Breast Milk Assisted mom with latching baby to left breast in left side lying position, baby latched easily, actively nursing with little stimulation and audible swallows heard, nursed x 15 min  LATCH Score Latch: Grasps breast easily, tongue down, lips flanged, rhythmical sucking.  Audible Swallowing: Spontaneous and intermittent  Type of Nipple: Everted at rest and after stimulation  Comfort (Breast/Nipple): Soft / non-tender  Hold (Positioning): Assistance needed to correctly position infant at breast and maintain latch.  LATCH Score: 9   Lactation Tools Discussed/Used    Interventions Interventions: Assisted with latch;Hand express;Adjust position;Education  Discharge Pump: Personal WIC Program: No  Consult Status Consult Status: PRN Date: 05/01/24 Follow-up type: In-patient    [name] 05/01/2024, 1:03 PM

## 2024-05-01 NOTE — H&P (Signed)
 [facility] Labor & Delivery  History and Physical  ASSESSMENT AND PLAN   Lori Stevenson is a 28 y.o. G2P1001 at [redacted]w[redacted]d with EDD: 05/15/2024, Date entered prior to episode creation admitted for management of labor.  Labor Plan - Admitted in active labor, expectant management at this time, AROM if appropriate/desired by patient. - Patient strongly desires epidural, will notify anesthesia once labs are complete. - Anticipate SVD  Fetal Status: - cephalic presentation by SVE - EFW: 7.5 lbs by Leopold's - Continuous FGHR monitoring - FHT currently cat I  Other Active Problems  Asthma - Avoid hemabate   Labs/Immunizations: Prenatal labs and studies: ABO, Rh: O/Negative/-- (12/31 1050) Antibody: Negative (04/22 1102) Rubella: 1.95 (12/31 1050) Varicella: Reactive (12/31 1050)  RPR: Non Reactive (04/22 1102)  HBsAg: Negative (12/31 1050)  HepC: Non Reactive (12/31 1050) HIV: Non Reactive (04/22 1102)  GBS: Negative/-- (06/16 1144)   TDAP: given prenatally   Postpartum Plan: - Feeding: Breast Milk and Formula - Contraception: plans undecided - Prenatal Care Provider: [facility]     HPI   Chief Complaint: Contractions  Lori Stevenson is a 28 y.o. G2P1001 at [redacted]w[redacted]d who presents for evaluation of labor. Contractions started around 4 am and have continued to increase in frequency and intensity. She  endorses normal fetal movement and denies LOF.    Pregnancy Complications Patient Active Problem List   Diagnosis Date Noted   Normal labor 05/01/2024   Eczema 03/08/2024   Bartholin cyst 01/27/2024   Intermittent asthma 10/01/2023   Supervision of other normal pregnancy, antepartum 10/01/2023   History of asthma 03/13/2022   Rh negative state in antepartum period 03/13/2022    Review of Systems A twelve point review of systems was negative except as stated in HPI.   HISTORY   Medications Medications Prior to Admission  Medication Sig Dispense  Refill Last Dose/Taking   albuterol  (PROVENTIL ) (2.5 MG/3ML) 0.083% nebulizer solution Take 3 mLs (2.5 mg total) by nebulization every 6 (six) hours as needed for wheezing or shortness of breath. 75 mL 12    albuterol  (VENTOLIN  HFA) 108 (90 Base) MCG/ACT inhaler Inhale 2 puffs into the lungs every 4 (four) hours as needed. 8.5 g 2    ipratropium (ATROVENT ) 0.06 % nasal spray Place 2 sprays into both nostrils 4 (four) times daily. 15 mL 0    ondansetron  (ZOFRAN -ODT) 4 MG disintegrating tablet Take 1 tablet (4 mg total) by mouth every 8 (eight) hours as needed for nausea or vomiting. 20 tablet 2    Prenatal Vit-Fe Fumarate-FA (PRENATAL PO) Take by mouth.      triamcinolone  ointment (KENALOG ) 0.5 % Apply 1 Application topically 2 (two) times daily. (Patient not taking: Reported on 04/19/2024) 30 g 0     Allergies has no known allergies.   OB History OB History  Gravida Para Term Preterm AB Living  2 1 1  0 0 1  SAB IAB Ectopic Multiple Live Births  0 0 0 0 1    # Outcome Date GA Lbr Len/2nd Weight Sex Type Anes PTL Lv  2 Current           1 Term 07/26/22 [redacted]w[redacted]d / 02:53 2820 g M Vag-Vacuum EPI, Local  LIV     Name: Govea,BOY Tehani     Apgar1: 8  Apgar5: 9    Past Medical History Past Medical History:  Diagnosis Date   Asthma    Encounter for supervision of normal first pregnancy in third trimester 06/18/2022  Clinical Staff    Provider      Office Location     [facility]    Dating     LMP 08/04/2021      Language     English    Anatomy US      Normal      Flu Vaccine          Genetic Screen     NIPS: Declined      TDaP vaccine      Given 05/03/22    Hgb A1C or   GTT    Early :  Third trimester : 120      Covid              LAB RESULTS       Rhogam     Given 05/23/22    Blood Type    O/Negat    Past Surgical History Past Surgical History:  Procedure Laterality Date   WISDOM TOOTH EXTRACTION     four;  age 66    Social History  reports that she has quit smoking.  Her smoking use included cigarettes. She has never been exposed to tobacco smoke. She has never used smokeless tobacco. She reports that she does not drink alcohol and does not use drugs.   Family History family history includes Asthma in her half-brother; Cancer (age of onset: 68) in her paternal grandfather; Healthy in her father, half-sister, half-sister, half-sister, maternal grandfather, maternal grandmother, and paternal grandmother; Thyroid disease in her mother.   PHYSICAL EXAM   There were no vitals filed for this visit.  Constitutional: No acute distress, well appearing, and well nourished. Neurologic: She is alert and conversational.  Psychiatric: She has a normal mood and affect.  Musculoskeletal: Normal gait, grossly normal range of motion Cardiovascular: Normal rate.   Pulmonary/Chest: Normal work of breathing.  Gastrointestinal/Abdominal: Soft. Gravid. There is no tenderness.  Skin: Skin is warm and dry. No rash noted.  Genitourinary: Normal external female genitalia.  SVE:   Dilation: 7 Effacement (%): 90 Exam by:: A Evern RN SSE: deferred  NST Interpretation Indication: Contractions Baseline: 140 bpm Variability: moderate Accelerations: present Decelerations: variables with contractions and pushing Contractions: regular, every 1-2 minutes Time noted:  See OBIX Impression: appropriate and reassuring for stage of labor Authenticated by: Lauraine PARAS Laterra Lubinski   Attending Dr. Leigh was immediately available for the care of the patient.   Lauraine PARAS Phoenyx Melka, CNM

## 2024-05-02 LAB — RPR: RPR Ser Ql: NONREACTIVE

## 2024-05-02 LAB — CBC
HCT: 27 % — ABNORMAL LOW (ref 36.0–46.0)
Hemoglobin: 8.7 g/dL — ABNORMAL LOW (ref 12.0–15.0)
MCH: 26.1 pg (ref 26.0–34.0)
MCHC: 32.2 g/dL (ref 30.0–36.0)
MCV: 81.1 fL (ref 80.0–100.0)
Platelets: 178 10*3/uL (ref 150–400)
RBC: 3.33 MIL/uL — ABNORMAL LOW (ref 3.87–5.11)
RDW: 14.3 % (ref 11.5–15.5)
WBC: 10.5 10*3/uL (ref 4.0–10.5)
nRBC: 0 % (ref 0.0–0.2)

## 2024-05-02 LAB — FETAL SCREEN: Fetal Screen: NEGATIVE

## 2024-05-02 MED ORDER — RHO D IMMUNE GLOBULIN 1500 UNIT/2ML IJ SOSY
300.0000 ug | PREFILLED_SYRINGE | Freq: Once | INTRAMUSCULAR | Status: AC
  Administered 2024-05-02: 300 ug via INTRAMUSCULAR
  Filled 2024-05-02: qty 2

## 2024-05-02 NOTE — Discharge Summary (Signed)
 OB Discharge Summary     Patient Name: Lori Stevenson DOB: [birth year] MRN: [id number]  Date of admission: 05/01/2024 Delivering provider: Lauraine Range, CNM  Date of Delivery: 05/01/2024  Date of discharge: 05/02/2024  Admitting diagnosis: Normal labor [O80, Z37.9] Intrauterine pregnancy: [redacted]w[redacted]d     Secondary diagnosis: None     Discharge diagnosis: Term Pregnancy Delivered                                                                                                Post partum procedures:none  Augmentation: N/A  Complications: None  Hospital course:  Onset of Labor With Vaginal Delivery      28 y.o. yo G2P2002 at [redacted]w[redacted]d was admitted in Active Labor on 05/01/2024. Labor course was complicated by NA  Membrane Rupture Time/Date: 8:12 AM,05/01/2024  Delivery Method:Vaginal, Spontaneous Operative Delivery:N/A Episiotomy: None Lacerations:  2nd degree See delivery note for details  Patient had a postpartum course complicated by NA.  She is tolerating regular diet, pain is controlled with PO medications, ambulating and voiding without difficulty. She reports breastfeeding is going well. She also plans to supplement with formula.   Patient is discharged home in stable condition on 05/02/24.  Newborn Data: Birth date:05/01/2024 Birth time:8:40 AM Gender:Female Living status:Living Apgars:8 ,9  Weight:3380 g   Physical exam  Vitals:   05/01/24 1546 05/01/24 2117 05/02/24 0020 05/02/24 0729  BP: 98/69 95/60 103/75 107/80  Pulse: 61 72 74 67  Resp: 20 18 18 16   Temp: 98.3 F (36.8 C) 98.4 F (36.9 C) 98.6 F (37 C) 98 F (36.7 C)  TempSrc: Oral Oral Oral Oral  SpO2: 100% 100% 100% 100%  Weight:      Height:       General: alert, cooperative, and no distress Lochia: appropriate Uterine Fundus: firm Incision: N/A DVT Evaluation: No evidence of DVT seen on physical exam.  Labs: Lab Results  Component Value Date   WBC 10.5 05/02/2024   HGB 8.7 (L) 05/02/2024   HCT 27.0  (L) 05/02/2024   MCV 81.1 05/02/2024   PLT 178 05/02/2024    Discharge instruction: per After Visit Summary.  Medications:  Allergies as of 05/02/2024   No Known Allergies      Medication List     STOP taking these medications    ondansetron  4 MG disintegrating tablet Commonly known as: ZOFRAN -ODT   triamcinolone  ointment 0.5 % Commonly known as: KENALOG        TAKE these medications    albuterol  (2.5 MG/3ML) 0.083% nebulizer solution Commonly known as: PROVENTIL  Take 3 mLs (2.5 mg total) by nebulization every 6 (six) hours as needed for wheezing or shortness of breath.   albuterol  108 (90 Base) MCG/ACT inhaler Commonly known as: VENTOLIN  HFA Inhale 2 puffs into the lungs every 4 (four) hours as needed.   ipratropium 0.06 % nasal spray Commonly known as: ATROVENT  Place 2 sprays into both nostrils 4 (four) times daily.   PRENATAL PO Take by mouth.        Diet: routine diet  Activity: Advance as tolerated. Pelvic rest for 6 weeks.  Outpatient follow up:  Follow-up Information     [facility]. Schedule an appointment as soon as possible for a visit in 2 week(s).   Specialty: Obstetrics and Gynecology Why: 2 weeks virtual visit and 6 weeks in office visits Contact information: [address] [place]  [phone]                  Postpartum contraception: IUD Mirena Rhogam Given postpartum: yes Rubella vaccine given postpartum: immune Varicella vaccine given postpartum: immune TDaP given antepartum or postpartum: given antepartum    Newborn Delivery   Birth date/time: 05/01/2024 08:40:00 Delivery type: Vaginal, Spontaneous      Baby Feeding: breast and formula  Disposition:home with mother  SIGNED:  Slater Rains, CNM 05/02/2024 4:41 PM

## 2024-05-02 NOTE — Discharge Instructions (Signed)

## 2024-05-02 NOTE — Progress Notes (Signed)
 Discharge instructions reviewed with patient and significant other.  Questions answered and follow up care reviewed.  Printed copies given to patient for reference after discharge home.

## 2024-05-02 NOTE — Lactation Note (Signed)
 This note was copied from a baby's chart. Lactation Consultation Note  Patient Name: Lori Stevenson Date: 05/02/2024 Age:27 hours Reason for consult: Difficult latch;Early term 37-38.6wks;RN request;Breastfeeding assistance;Maternal discharge   Maternal Data   MOB states infant latch feels narrow and is causing breast abrasions with no visible blood or laceration. She is concerned about intake measurement and plans on offering breast while supplementing expressed breast milk and formula upon discharge. She currently is using a MomCozy hands-free with 21mm flange and was provided a hand pump, 21mm flange, and Hydrogel dressings upon requested consult prior to discharge. MOB reports infant has had several voids, but currently no stools reported during hospital admission as of 30 HOL. Feeding Mother's Current Feeding Choice: Breast Milk and Formula Attempt to feed at breast not successful, as infant sleeping and too drowsy to fully awaken for feeding. Demonstrated latch technique of compressing nipple to accommodate infant mouth size, and encouraging infant widening of gape by tapping maternal nipple from nose to chin and bringing supported infant deeply onto breast. MOB reported less pain utilizing this strategy with football hold, but duration of feeding length limited by infant sleeping state.  Lactation Tools Discussed/Used Maternal pt wishes to continue exclusively feeding at breast for as long as possible and will consider introducing pumping for supplementation of expressed milk in addition to formula as planned at admission. Plan to call [facility] upon next infant hunger cue demonstration for latch assessment and evaluation of maternal breast pain upon initiation of feeding. Interventions Interventions: Breast feeding basics reviewed;Breast massage;Hand express;Breast compression;Adjust position;Support pillows;Hand pump;DEBP;Education;CDC milk storage guidelines Review diaper output  changes, feeding goals and plan, supplementation options, normative infant behavior and stomach size capacity, engorgement prevention and management, and mastitis protocol prior to dyad discharging home. Refer to outpatient clinic for additional follow up PRN any time upon leaving hospital.  Discharge Discharge Education: Engorgement and breast care;Warning signs for feeding baby;Outpatient recommendation Pump: Hands Free;DEBP;Manual WIC Program: Yes  Consult Status Consult Status: Follow-up Date: 05/02/24 Follow-up type: In-patient (Advised MOB to call [facility] or nurse for assist during next feed for assessment)    Lori Stevenson 05/02/2024, 3:17 PM

## 2024-05-03 LAB — RHOGAM INJECTION: Unit division: 0

## 2024-05-04 ENCOUNTER — Encounter: Admitting: Certified Nurse Midwife

## 2024-05-21 ENCOUNTER — Encounter: Payer: Self-pay | Admitting: Certified Nurse Midwife

## 2024-05-21 ENCOUNTER — Telehealth: Admitting: Certified Nurse Midwife

## 2024-05-21 MED ORDER — ACCRUFER 30 MG PO CAPS: 1.0000 | ORAL_CAPSULE | Freq: Two times a day (BID) | ORAL | 6 refills | Status: AC

## 2024-05-21 NOTE — Progress Notes (Signed)
   Virtual Visit via Video Note  I connected with Lori Stevenson on 05/21/24 at  11:15 AM EDT by a video enabled telemedicine application and verified that I am speaking with the correct person using two identifiers.  Location: Patient: Home Provider: [facility] office   I discussed the limitations of evaluation and management by telemedicine and the availability of in person appointments. The patient expressed understanding and agreed to proceed.    History of Present Illness:   Lori Stevenson is a 28 y.o. G70P2002 female who presents for a 2 week televisit for mood check. She is 2 weeks postpartum following a spontaneous vaginal.  The delivery was at 38 gestational weeks.  Postpartum course has been well so far. Baby is feeding by bottle. Bleeding: light. Postpartum depression screening: negative.  EDPS score is 1.  Reports some fatigue, was anemic after delivery.     The following portions of the patient's history were reviewed and updated as appropriate: allergies, current medications, past family history, past medical history, past social history, past surgical history, and problem list.   Observations/Objective:   unknown if currently breastfeeding. Gen App: NAD Psych: normal speech, affect. Good mood.        05/21/2024   11:13 AM 05/01/2024    5:35 PM 10/01/2023    1:34 PM 09/03/2022    3:35 PM 07/27/2022    4:18 PM  Edinburgh Postnatal Depression Scale Screening Tool  I have been able to laugh and see the funny side of things. 0 0 0 0 0  I have looked forward with enjoyment to things. 0 1 0 0 0  I have blamed myself unnecessarily when things went wrong. 1 1 0 1 0  I have been anxious or worried for no good reason. 0 2 0 0 0  I have felt scared or panicky for no good reason. 0 0 0 0 0  Things have been getting on top of me. 0 0 1 0 0  I have been so unhappy that I have had difficulty sleeping. 0 0 0 0 0  I have felt sad or miserable. 0 0 0 0 0  I have been so unhappy  that I have been crying. 0 0 0 0 0  The thought of harming myself has occurred to me. 0 0 0 0 0  Edinburgh Postnatal Depression Scale Total 1 4 1  1   0      Data saved with a previous flowsheet row definition         Assessment and Plan:   1. Encounter for screening for maternal depression - Screening Negative today. Will rescreen at 6 week postpartum visit.     2. Postpartum state - Overall doing well. Continue routine postpartum home care.    3. Contraception - Mirena at 6w visit, abstinence until placed  4. Anemia - Start accrufer bid   Follow Up Instructions:     I discussed the assessment and treatment plan with the patient. The patient was provided an opportunity to ask questions and all were answered. The patient agreed with the plan and demonstrated an understanding of the instructions.   The patient was advised to call back or seek an in-person evaluation if the symptoms worsen or if the condition fails to improve as anticipated.   Harlene LITTIE Cisco, CNM

## 2024-06-15 ENCOUNTER — Ambulatory Visit: Admitting: Certified Nurse Midwife

## 2024-07-05 ENCOUNTER — Emergency Department
Admission: EM | Admit: 2024-07-05 | Discharge: 2024-07-06 | Disposition: A | Discharge status: Home / Self Care | Attending: Emergency Medicine | Admitting: Emergency Medicine

## 2024-07-05 ENCOUNTER — Other Ambulatory Visit: Payer: Self-pay

## 2024-07-05 DIAGNOSIS — J45909 Unspecified asthma, uncomplicated: Secondary | ICD-10-CM | POA: Insufficient documentation

## 2024-07-05 DIAGNOSIS — Y93E2 Activity, laundry: Secondary | ICD-10-CM | POA: Insufficient documentation

## 2024-07-05 DIAGNOSIS — Z77098 Contact with and (suspected) exposure to other hazardous, chiefly nonmedicinal, chemicals: Secondary | ICD-10-CM

## 2024-07-05 DIAGNOSIS — T551X1A Toxic effect of detergents, accidental (unintentional), initial encounter: Secondary | ICD-10-CM | POA: Diagnosis not present

## 2024-07-05 DIAGNOSIS — H5713 Ocular pain, bilateral: Secondary | ICD-10-CM | POA: Diagnosis present

## 2024-07-05 MED ORDER — OXYCODONE HCL 5 MG PO TABS
5.0000 mg | ORAL_TABLET | Freq: Once | ORAL | Status: AC
  Administered 2024-07-05: 5 mg via ORAL
  Filled 2024-07-05: qty 1

## 2024-07-05 MED ORDER — FLUORESCEIN SODIUM 1 MG OP STRP
1.0000 | ORAL_STRIP | Freq: Once | OPHTHALMIC | Status: AC
  Administered 2024-07-05: 1 via OPHTHALMIC
  Filled 2024-07-05: qty 1

## 2024-07-05 MED ORDER — TETRACAINE HCL 0.5 % OP SOLN
2.0000 [drp] | Freq: Once | OPHTHALMIC | Status: AC
  Administered 2024-07-05: 2 [drp] via OPHTHALMIC
  Filled 2024-07-05: qty 4

## 2024-07-05 MED ORDER — ONDANSETRON 4 MG PO TBDP
4.0000 mg | ORAL_TABLET | Freq: Once | ORAL | Status: AC
  Administered 2024-07-05: 4 mg via ORAL
  Filled 2024-07-05: qty 1

## 2024-07-05 NOTE — ED Provider Notes (Signed)
 [facility] Provider Note    Event Date/Time   First MD Initiated Contact with Patient 07/05/24 2329     (approximate)   History   Chemical Exposure and Eye Pain   HPI  Lori Stevenson is a 28 y.o. female with history of asthma who presents to the emergency department with bilateral eye pain, tearing, blurry vision after a Tide pod ruptured and got into both of her eyes around 10:30 PM tonight.  She states that she flushed her eyes for 15 to 20 minutes.  No vision loss.  She does wear glasses, no contacts.  Patient is not breast-feeding.  She denies any difficulty swallowing, speaking or breathing.  States it did not get into her nose or mouth.   History provided by patient, husband.    Past Medical History:  Diagnosis Date   Asthma    Encounter for supervision of normal first pregnancy in third trimester 06/18/2022              Clinical Staff    Provider      Office Location     [facility]    Dating     LMP 08/04/2021      Language     English    Anatomy US      Normal      Flu Vaccine          Genetic Screen     NIPS: Declined      TDaP vaccine      Given 05/03/22    Hgb A1C or   GTT    Early :  Third trimester : 120      Covid              LAB RESULTS       Rhogam     Given 05/23/22    Blood Type    O/Negat    Past Surgical History:  Procedure Laterality Date   WISDOM TOOTH EXTRACTION     four;  age 37    MEDICATIONS:  Prior to Admission medications   Medication Sig Start Date End Date Taking? Authorizing Provider  albuterol  (PROVENTIL ) (2.5 MG/3ML) 0.083% nebulizer solution Take 3 mLs (2.5 mg total) by nebulization every 6 (six) hours as needed for wheezing or shortness of breath. 11/01/23   Brimage, Vondra, DO  albuterol  (VENTOLIN  HFA) 108 (90 Base) MCG/ACT inhaler Inhale 2 puffs into the lungs every 4 (four) hours as needed. 04/06/24   Slaughterbeck, Damien, CNM  Ferric Maltol  (ACCRUFER ) 30 MG CAPS Take 1 capsule (30 mg total) by mouth in the  morning and at bedtime. 05/21/24   Jayne Harlene CROME, CNM  ipratropium (ATROVENT ) 0.06 % nasal spray Place 2 sprays into both nostrils 4 (four) times daily. 07/17/23   Brimage, Vondra, DO  Prenatal Vit-Fe Fumarate-FA (PRENATAL PO) Take by mouth.    [provider]  omeprazole  (PRILOSEC  OTC) 20 MG tablet Take 1 tablet (20 mg total) by mouth daily. 02/13/17 12/22/19  Gordan Huxley, MD  promethazine  (PHENERGAN ) 25 MG tablet Take 25 mg by mouth every 6 (six) hours as needed for nausea or vomiting.  12/22/19  [provider]  sucralfate  (CARAFATE ) 1 g tablet Take 1 tablet (1 g total) by mouth 4 (four) times daily as needed (for abdominal discomfort, nausea, and/or vomiting). 02/13/17 12/22/19  Gordan Huxley, MD    Physical Exam   Triage Vital Signs: ED Triage Vitals  Encounter Vitals Group     BP  07/05/24 2325 (!) 123/95     Girls Systolic BP Percentile --      Girls Diastolic BP Percentile --      Boys Systolic BP Percentile --      Boys Diastolic BP Percentile --      Pulse Rate 07/05/24 2325 81     Resp 07/05/24 2325 18     Temp 07/05/24 2325 98 F (36.7 C)     Temp Source 07/05/24 2325 Oral     SpO2 07/05/24 2325 100 %     Weight 07/05/24 2326 160 lb 15 oz (73 kg)     Height 07/05/24 2326 4' 10 (1.473 m)     Head Circumference --      Peak Flow --      Pain Score 07/05/24 2326 9     Pain Loc --      Pain Education --      Exclude from Growth Chart --     Most recent vital signs: Vitals:   07/05/24 2325  BP: (!) 123/95  Pulse: 81  Resp: 18  Temp: 98 F (36.7 C)  SpO2: 100%     CONSTITUTIONAL: Alert and responds appropriately to questions. Well-appearing; well-nourished HEAD: Normocephalic, atraumatic EYES: Conjunctivae injected slightly bilaterally with tearing, pupils equal and reactive, extraocular movements intact, no chemosis, no signs of endophthalmitis, no hyphema or hypopyon, intraocular pressure of the right eye is 19 mmHg and intraocular pressure  in the left eye is 20 mmHg, no fluorescein  uptake, no corneal ulceration appreciated, pH of both eyes is 7 ENT: normal nose; moist mucous membranes NECK: Normal range of motion CARD: Regular rate and rhythm RESP: Normal chest excursion without splinting or tachypnea; no hypoxia or respiratory distress, speaking full sentences ABD/GI: non-distended EXT: Normal ROM in all joints, no major deformities noted SKIN: Normal color for age and race, no rashes on exposed skin NEURO: Moves all extremities equally, normal speech, no facial asymmetry noted PSYCH: The patient's mood and manner are appropriate. Grooming and personal hygiene are appropriate.  ED Results / Procedures / Treatments   LABS: (all labs ordered are listed, but only abnormal results are displayed) Labs Reviewed - No data to display   EKG:  EKG Interpretation Date/Time:    Ventricular Rate:    PR Interval:    QRS Duration:    QT Interval:    QTC Calculation:   R Axis:      Text Interpretation:            RADIOLOGY: My personal review and interpretation of imaging:    I have personally reviewed all radiology reports. No results found.   PROCEDURES:  Critical Care performed: Yes, see critical care procedure note(s)   CRITICAL CARE Performed by: Josette Sink   Total critical care time: 30 minutes  Critical care time was exclusive of separately billable procedures and treating other patients.  Critical care was necessary to treat or prevent imminent or life-threatening deterioration.  Critical care was time spent personally by me on the following activities: development of treatment plan with patient and/or surrogate as well as nursing, discussions with consultants, evaluation of patient's response to treatment, examination of patient, obtaining history from patient or surrogate, ordering and performing treatments and interventions, ordering and review of laboratory studies, ordering and review of  radiographic studies, pulse oximetry and re-evaluation of patient's condition.   Procedures    IMPRESSION / MDM / ASSESSMENT AND PLAN / ED COURSE  I reviewed the triage vital  signs and the nursing notes.   Patient here with chemical exposure to both eyes.     DIFFERENTIAL DIAGNOSIS (includes but not limited to):   Chemical exposure, superficial burns, endophthalmitis, traumatic uveitis, corneal abrasion, corneal ulceration  Patient's presentation is most consistent with acute presentation with potential threat to life or bodily function.  PLAN: Will irrigate eyes and perform eye exam.   MEDICATIONS GIVEN IN ED: Medications  fluorescein  ophthalmic strip 1 strip (1 strip Both Eyes Given by Other 07/05/24 2345)  tetracaine  (PONTOCAINE) 0.5 % ophthalmic solution 2 drop (2 drops Both Eyes Given by Other 07/05/24 2345)  oxyCODONE  (Oxy IR/ROXICODONE ) immediate release tablet 5 mg (5 mg Oral Given 07/05/24 2345)  ondansetron  (ZOFRAN -ODT) disintegrating tablet 4 mg (4 mg Oral Given 07/05/24 2345)     ED COURSE: Patient feeling much better after both eyes irrigated with a liter of saline using a Morgan lens.  She is now able to open her eyes fully and tolerate having the lights on in the room.  Visual acuity here not extremely helpful as she states she wears glasses at baseline and did not bring them here with her but she denies any vision loss.  No visual field deficits.  Intraocular pressure in both eyes normal.  No signs clinically of endophthalmitis and she has no fluorescein  uptake.  pH in both eyes normal after irrigation.  Will give ophthalmology follow-up information.  Discussed using Tylenol , Motrin  as needed for discomfort.  Will return to the ED if symptoms worsen.   At this time, I do not feel there is any life-threatening condition present. I reviewed all nursing notes, vitals, pertinent previous records.  All lab and urine results, EKGs, imaging ordered have been independently reviewed  and interpreted by myself.  I reviewed all available radiology reports from any imaging ordered this visit.  Based on my assessment, I feel the patient is safe to be discharged home without further emergent workup and can continue workup as an outpatient as needed. Discussed all findings, treatment plan as well as usual and customary return precautions.  They verbalize understanding and are comfortable with this plan.  Outpatient follow-up has been provided as needed.  All questions have been answered.    CONSULTS:  none   OUTSIDE RECORDS REVIEWED: Reviewed recent OB/GYN notes.     FINAL CLINICAL IMPRESSION(S) / ED DIAGNOSES   Final diagnoses:  Chemical exposure of eye     Rx / DC Orders   ED Discharge Orders     None        Note:  This document was prepared using Dragon voice recognition software and may include unintentional dictation errors.   Tam Delisle, Josette SAILOR, DO 07/06/24 [phone]

## 2024-07-05 NOTE — ED Triage Notes (Signed)
 Patient ambulatory to triage with complaints of tide free and clear laundry packet bursting into her eyes. Patient states this occurred around 2200, tried to rinse her eyes out and got minimal relief in the left but her right eye is still burning. Patient states vision is not clear in either eye.

## 2024-07-06 ENCOUNTER — Encounter: Payer: Self-pay | Admitting: Certified Nurse Midwife

## 2024-07-06 ENCOUNTER — Ambulatory Visit: Admitting: Certified Nurse Midwife

## 2024-07-06 DIAGNOSIS — Z3202 Encounter for pregnancy test, result negative: Secondary | ICD-10-CM

## 2024-07-06 DIAGNOSIS — Z30430 Encounter for insertion of intrauterine contraceptive device: Secondary | ICD-10-CM | POA: Diagnosis not present

## 2024-07-06 LAB — POCT URINE PREGNANCY: Preg Test, Ur: NEGATIVE

## 2024-07-06 MED ORDER — LEVONORGESTREL 20 MCG/DAY IU IUD
1.0000 | INTRAUTERINE_SYSTEM | Freq: Once | INTRAUTERINE | Status: AC
Start: 2024-07-06 — End: 2024-07-06
  Administered 2024-07-06: 1 via INTRAUTERINE

## 2024-07-06 NOTE — Progress Notes (Signed)
 Post Partum Visit Note  Lori Stevenson is a 28 y.o. G7P2002 female who presents for a postpartum visit. She is 7 weeks postpartum following a normal spontaneous vaginal delivery.  I have fully reviewed the prenatal and intrapartum course. The delivery was at 38 gestational weeks.  Anesthesia: none. Postpartum course has been uncomplicated. Baby boy Lori Stevenson is doing well. Baby is feeding by bottle - Similac Sensitive RS. Bleeding no bleeding. Bowel function is normal. Bladder function is normal. Patient is sexually active. Contraception method is none, desires IUD today. Postpartum depression screening: negative.   The pregnancy intention screening data noted above was reviewed. Potential methods of contraception were discussed. The patient elected to proceed with No data recorded.    Health Maintenance Due  Topic Date Due   Pneumococcal Vaccine (1 of 2 - PCV) Never done   Hepatitis B Vaccines 19-59 Average Risk (1 of 3 - 19+ 3-dose series) Never done   HPV VACCINES (1 - 3-dose SCDM series) Never done   INFLUENZA VACCINE  Never done   COVID-19 Vaccine (1 - 2024-25 season) Never done    The following portions of the patient's history were reviewed and updated as appropriate: allergies, current medications, past family history, past medical history, past social history, past surgical history, and problem list.  Review of Systems Pertinent items are noted in HPI.  Objective:  LMP 06/10/2024 (Exact Date)    General:  alert, cooperative, appears stated age, and no distress   Breasts:  normal  Lungs: clear to auscultation bilaterally  Heart:  regular rate and rhythm, S1, S2 normal, no murmur, click, rub or gallop  Abdomen: Soft, nontender <59fb DR   GU exam:  normal    IUD Insertion Procedure Note Patient identified, informed consent performed, consent signed. Discussed risks of irregular bleeding, cramping, infection, malpositioning or misplacement of the IUD outside the uterus  which may require further procedure such as laparoscopy, risk of failure <1%. Time out was performed. Urine pregnancy test negative.  Speculum placed in the vagina. Cervix visualized. Cleaned with Betadine x 2. Grasped anteriorly with Allis clamp. Uterus sounded to 7 cm. IUD placed per manufacturer's recommendations. Strings trimmed to 3 cm. Allis was removed, good hemostasis noted.  Patient tolerated procedure well.   ASSESSMENT:  Routine postpartum follow-up  Postpartum care and examination of lactating mother  Encounter for IUD insertion - Plan: POCT urine pregnancy, levonorgestrel  (MIRENA ) 20 MCG/DAY IUD 1 each   Meds ordered this encounter  Medications   levonorgestrel  (MIRENA ) 20 MCG/DAY IUD 1 each     Plan:    Assessment:    1. Routine postpartum follow-up   2. Postpartum care and examination of lactating mother   3. Encounter for IUD insertion     Normal postpartum exam.   Plan:   Essential components of care per ACOG recommendations:  1.  Mood and well being: Patient with negative depression screening today.   - Patient tobacco use? No.   - hx of drug use? No.    2. Infant care and feeding:  -Patient currently breastmilk feeding? No.  -Social determinants of health (SDOH) reviewed in EPIC. No concerns  3. Sexuality, contraception and birth spacing - Patient does not want a pregnancy in the next year.  Desired family size is 2 children.  - Reviewed reproductive life planning. Reviewed contraceptive methods based on pt preferences and effectiveness.  Patient desired IUD or IUS today.   - Patient was given post-procedure instructions. She was advised  to have backup contraception for one week.  Call if you are having increasing pain, cramps or bleeding or if you have a fever greater than 100.4 degrees F., shaking chills, nausea or vomiting. Patient was also asked to check IUD strings periodically and follow up in 4 weeks for IUD check.   4. Sleep and  fatigue -Encouraged family/partner/community support of 4 hrs of uninterrupted sleep to help with mood and fatigue  5. Physical Recovery  - Discussed patients delivery and complications. She describes her labor as good. - Patient had a Vaginal, no problems at delivery. Patient had a 2nd degree laceration. Perineal healing reviewed. Patient expressed understanding - Patient has urinary incontinence? No. - Patient is safe to resume physical and sexual activity  6.  Health Maintenance - HM due items addressed Yes - Last pap smear  Diagnosis  Date Value Ref Range Status  02/14/2022   Final   - Negative for intraepithelial lesion or malignancy (NILM)   Pap smear not done at today's visit.  -Breast Cancer screening indicated? No.   7. Chronic Disease/Pregnancy Condition follow up: None  - PCP follow up  Lori Stevenson, CNM [facility], [facility]

## 2024-07-06 NOTE — ED Notes (Signed)
 Eye irrigation completed in both eyes with 1L of NS each

## 2024-07-06 NOTE — Discharge Instructions (Signed)
 You may alternate over the counter Tylenol 1000 mg every 6 hours as needed for pain, fever and Ibuprofen 800 mg every 6-8 hours as needed for pain, fever.  Please take Ibuprofen with food.  Do not take more than 4000 mg of Tylenol (acetaminophen) in a 24 hour period.

## 2024-08-03 NOTE — Progress Notes (Unsigned)
 Pcp, No   No chief complaint on file.   HPI:      Lori Stevenson is a 28 y.o. G2P2002 whose LMP was Patient's last menstrual period was 06/10/2024 (exact date)., presents today for ***     Patient Active Problem List   Diagnosis Date Noted   Postpartum care following vaginal delivery 05/02/2024   Encounter for care or examination of lactating mother 05/02/2024   Normal labor 05/01/2024   Eczema 03/08/2024   Bartholin cyst 01/27/2024   Intermittent asthma 10/01/2023   Supervision of other normal pregnancy, antepartum 10/01/2023   History of asthma 03/13/2022   Rh negative state in antepartum period 03/13/2022    Past Surgical History:  Procedure Laterality Date   WISDOM TOOTH EXTRACTION     four;  age 76    Family History  Problem Relation Age of Onset   Thyroid disease Mother    Healthy Father    Healthy Maternal Grandmother    Healthy Maternal Grandfather    Healthy Paternal Grandmother    Cancer Paternal Grandfather 19       lung   Healthy Half-Sister    Healthy Half-Sister    Healthy Half-Sister    Asthma Half-Brother    Breast cancer Neg Hx    Colon cancer Neg Hx     Social History   Socioeconomic History   Marital status: Married    Spouse name: Curtistine   Number of children: 1   Years of education: 14   Highest education level: Not on file  Occupational History   Occupation: Biochemist, 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  Other Topics Concern   Not on file  Social History Narrative   Right handed   One story home   Drinks caffeineq   Social Drivers of Health   Financial Resource Strain: Low Risk  (10/01/2023)   Overall Financial  Resource Strain (CARDIA)    Difficulty of Paying Living Expenses: Not very hard  Food Insecurity: No Food Insecurity (05/01/2024)   Hunger Vital Sign    Worried About Running Out of Food in the Last Year: Never true    Ran Out of Food in the Last Year: Never true  Transportation Needs: No Transportation Needs (05/01/2024)   PRAPARE - Administrator, Civil Service (Medical): No    Lack of Transportation (Non-Medical): No  Physical Activity: Insufficiently Active (10/01/2023)   Exercise Vital Sign    Days of Exercise per Week: 2 days    Minutes of Exercise per Session: 60 min  Stress: No Stress Concern Present (10/01/2023)   [employer] of Occupational Health - Occupational Stress Questionnaire    Feeling of Stress : Not at all  Social Connections: Moderately Integrated (10/01/2023)   Social Connection and Isolation Panel    Frequency of Communication with Friends and Family: More than three times a week    Frequency of Social Gatherings with Friends and Family: Once a week    Attends Religious Services: 1 to 4 times per year    Active Member of [employer] or Organizations: No    Attends Club or  Organization Meetings: Never    Marital Status: Married  Catering manager Violence: Not At Risk (05/01/2024)   Humiliation, Afraid, Rape, and Kick questionnaire    Fear of Current or Ex-Partner: No    Emotionally Abused: No    Physically Abused: No    Sexually Abused: No    Outpatient Medications Prior to Visit  Medication Sig Dispense Refill   albuterol  (PROVENTIL ) (2.5 MG/3ML) 0.083% nebulizer solution Take 3 mLs (2.5 mg total) by nebulization every 6 (six) hours as needed for wheezing or shortness of breath. 75 mL 12   albuterol  (VENTOLIN  HFA) 108 (90 Base) MCG/ACT inhaler Inhale 2 puffs into the lungs every 4 (four) hours as needed. 8.5 g 2   Ferric Maltol  (ACCRUFER ) 30 MG CAPS Take 1 capsule (30 mg total) by mouth in the morning and at bedtime. 60 capsule 6   ipratropium  (ATROVENT ) 0.06 % nasal spray Place 2 sprays into both nostrils 4 (four) times daily. 15 mL 0   Prenatal Vit-Fe Fumarate-FA (PRENATAL PO) Take by mouth.     No facility-administered medications prior to visit.      ROS:  Review of Systems   OBJECTIVE:   Vitals:  LMP 06/10/2024 (Exact Date)   Physical Exam  Results: No results found for this or any previous visit (from the past 24 hours).   Assessment/Plan: 1. Encounter for routine checking of intrauterine contraceptive device (IUD) (Primary)     No orders of the defined types were placed in this encounter.    Mathis LITTIE Getting, CMA 08/03/2024 11:44 AM

## 2024-08-05 ENCOUNTER — Ambulatory Visit (INDEPENDENT_AMBULATORY_CARE_PROVIDER_SITE_OTHER): Admitting: Certified Nurse Midwife

## 2024-08-05 VITALS — BP 107/68 | HR 65 | Wt 144.2 lb

## 2024-08-05 DIAGNOSIS — Z30431 Encounter for routine checking of intrauterine contraceptive device: Secondary | ICD-10-CM

## 2024-08-12 NOTE — Progress Notes (Unsigned)
    GYNECOLOGY OFFICE ENCOUNTER NOTE  History:  28 y.o. [id number] here today for today for IUD string check; {IUD Type:19197::Mirena ,Liletta ,Kyleena ,Paragard}  IUD was placed  ***. No complaints about the IUD, no concerning side effects.  The following portions of the patient's history were reviewed and updated as appropriate: allergies, current medications, past family history, past medical history, past social history, past surgical history and problem list. Last pap smear on *** was normal, negative HRHPV.  Review of Systems:  Pertinent items are noted in HPI.  Objective:  Physical Exam Last menstrual period 07/06/2024, not currently breastfeeding. CONSTITUTIONAL: Well-developed, well-nourished female in no acute distress.  NEUROLOGIC: Alert and oriented to person, place, and time. Normal reflexes, muscle tone coordination.  ABDOMEN: Soft, no distention noted.   PELVIC: Normal appearing external genitalia; normal appearing vaginal mucosa and cervix.  IUD strings visualized, about *** cm in length outside cervix. Done in the presence of a chaperone.  EXTREMITIES: Non-tender, no edema or cyanosis  Assessment & Plan:  Patient to keep IUD in place for up to *** years; can come in for removal if she desires pregnancy earlier or for any concerning side effects.    Mathis LITTIE Getting, CMA

## 2024-08-13 ENCOUNTER — Encounter: Payer: Self-pay | Admitting: Certified Nurse Midwife

## 2024-08-13 ENCOUNTER — Ambulatory Visit (INDEPENDENT_AMBULATORY_CARE_PROVIDER_SITE_OTHER): Admitting: Certified Nurse Midwife

## 2024-08-13 VITALS — BP 91/65 | HR 94 | Ht <= 58 in | Wt 141.6 lb

## 2024-08-13 DIAGNOSIS — T8332XA Displacement of intrauterine contraceptive device, initial encounter: Secondary | ICD-10-CM

## 2024-08-13 DIAGNOSIS — Z30431 Encounter for routine checking of intrauterine contraceptive device: Secondary | ICD-10-CM

## 2024-08-13 NOTE — Patient Instructions (Signed)
 Hormonal Birth Control (Hormonal Contraception): What to Know Hormonal birth control, also called hormonal contraception, is a type of birth control that uses chemicals called hormones to prevent pregnancy. It may include a combination of the hormones estrogen and progesterone, or only the hormone progesterone. Hormonal birth control works in these ways: It thickens the mucus in the cervix, which is the lowest part of the uterus. Thicker mucus makes it harder for sperm to get into the uterus. It changes the lining of the uterus. This makes it harder for an egg to attach or implant. It may stop the ovaries from releasing eggs, called ovulation. Some people who take hormonal birth control that contains only progesterone may continue to ovulate. Hormonal birth control doesn't protect against sexually transmitted infections (STIs). Pregnancy may still happen. Types of hormonal birth control  Estrogen and progesterone birth control Birth control that use a combination of estrogen and progesterone is available as: Pills that come in different combinations of hormones. Pills must be taken at the same time each day. They can affect your period. You can get your period monthly, once every 3 months, or not at all. A patch that is applied to the butt, belly, upper outer arm, or back. It's kept in place for 3 weeks. It's taken off for the last or fourth week of the menstrual cycle. A vaginal ring. The ring is placed in the vagina and left there for 3 weeks. It's then taken off for the last or fourth week of the cycle. Progesterone-only birth control Birth control that uses only progesterone is available as: Pills. These should be taken at the same time every day. This is very important to decrease the chance of pregnancy. Pills containing progestin-only are usually taken every day of the cycle. Other types of pills may have an inactive pill for the last 4 days of every cycle. Intrauterine device (IUD). This  device is inserted through the vagina and cervix into the uterus. It's taken out or replaced every 3 to 8 years, depending on the type. It can be taken out sooner. Implant. A plastic rod is placed under the skin of the upper arm. It is taken out or replaced every 3 years. It can be taken out sooner. Shot, also called injection. The shot is given once every 12 to 14 weeks. Risks associated with hormonal birth control Estrogen and progesterone birth control can sometimes cause side effects, such as: Feeling like you may throw up. Headaches. Breast tenderness. Bleeding or spotting between menstrual cycles. High blood pressure. This is rare. Strokes, heart attacks, or blood clots. These are rare. Progesterone-only birth control can sometimes have side effects, such as: Feeling like you may throw up. Headaches. Breast tenderness. Irregular menstrual bleeding. High blood pressure. This is rare. Talk to your health care provider about what side effects may mean for you. Questions to ask: What type of hormonal birth control is right for me? How long should I plan to use hormonal birth control? What are the side effects of the hormonal birth control method I choose? How can I prevent STIs while using hormonal birth control? Where to find more information Ask your provider for more information and resources about hormonal birth control. You can also go to: U.S. Department of Health and [employer], Office on Women's Health: http://hoffman.com/ This information is not intended to replace advice given to you by your health care provider. Make sure you discuss any questions you have with your health care provider. Document Revised:  05/05/2023 Document Reviewed: 05/05/2023 Elsevier Patient Education  2024 [employer].

## 2024-08-19 ENCOUNTER — Ambulatory Visit

## 2024-08-19 ENCOUNTER — Ambulatory Visit (INDEPENDENT_AMBULATORY_CARE_PROVIDER_SITE_OTHER): Admitting: Obstetrics

## 2024-08-19 ENCOUNTER — Encounter: Payer: Self-pay | Admitting: Obstetrics

## 2024-08-19 VITALS — BP 105/74 | HR 94 | Ht <= 58 in | Wt 141.0 lb

## 2024-08-19 DIAGNOSIS — Z30431 Encounter for routine checking of intrauterine contraceptive device: Secondary | ICD-10-CM | POA: Diagnosis not present

## 2024-08-19 DIAGNOSIS — T8332XA Displacement of intrauterine contraceptive device, initial encounter: Secondary | ICD-10-CM

## 2024-08-19 DIAGNOSIS — Z3042 Encounter for surveillance of injectable contraceptive: Secondary | ICD-10-CM | POA: Diagnosis not present

## 2024-08-19 DIAGNOSIS — Z30432 Encounter for removal of intrauterine contraceptive device: Secondary | ICD-10-CM | POA: Diagnosis not present

## 2024-08-19 MED ORDER — MEDROXYPROGESTERONE ACETATE 150 MG/ML IM SUSP
150.0000 mg | Freq: Once | INTRAMUSCULAR | Status: AC
  Administered 2024-08-19: 150 mg via INTRAMUSCULAR

## 2024-08-19 NOTE — Progress Notes (Signed)
   GYN ENCOUNTER  Subjective  HPI: Lori Stevenson is a 28 y.o. G2P2002 who presents today for IUD removal. Her IUD was placed 07/06/24. She reports that she has been continuously bleeding since then. She has had intense cramping recently. An US  today shows the IUD low in the uterus, near the cervix.  Past Medical History:  Diagnosis Date   Asthma    Encounter for supervision of normal first pregnancy in third trimester 06/18/2022              Clinical Staff    Provider      Office Location     [facility]    Dating     LMP 08/04/2021      Language     English    Anatomy US      Normal      Flu Vaccine          Genetic Screen     NIPS: Declined      TDaP vaccine      Given 05/03/22    Hgb A1C or   GTT    Early :  Third trimester : 120      Covid              LAB RESULTS       Rhogam     Given 05/23/22    Blood Type    O/Negat   Rh negative state in antepartum period 03/13/2022   Past Surgical History:  Procedure Laterality Date   WISDOM TOOTH EXTRACTION     four;  age 63   OB History     Gravida  2   Para  2   Term  2   Preterm      AB      Living  2      SAB      IAB      Ectopic      Multiple  0   Live Births  2          No Known Allergies  ROS: See HPI   Objective  BP 105/74   Pulse 94   Ht 4' 10 (1.473 m)   Wt 141 lb (64 kg)   LMP 07/06/2024 (Exact Date)   Breastfeeding No   BMI 29.47 kg/m   Physical examination   Pelvic:   Vulva: Normal appearance.  No lesions.  Vagina: No lesions or abnormalities noted.  Support: Normal pelvic support.  Urethra No masses tenderness or scarring.  Meatus Normal size without lesions or prolapse.  Cervix: Normal appearance.  No lesions. Ectropion. IUD strings not visible.  Perineum: Normal exam.  No lesions.   Procedure Note Cervix visualized with speculum. IUD strings not visible. Unable to extract strings with cytobrush string finder. Cervical block placed with 1% lidocaine . Cervix gently explored with  kelly clamp. IUD strings grasped with ring forceps and intact IUD easily removed with gentle traction. There was minimal bleeding. Lori Stevenson tolerated this well.   Assessment Malpositioned IUD removed Desires Depo  Plan -Depo shot given today. Undecided if she wants to continue this method long term or consider a different option. Will schedule visit when next injection is due.   Lori Stevenson, CNM

## 2024-10-30 ENCOUNTER — Emergency Department

## 2024-10-30 ENCOUNTER — Emergency Department
Admission: EM | Admit: 2024-10-30 | Discharge: 2024-10-30 | Disposition: A | Discharge status: Home / Self Care | Attending: Emergency Medicine | Admitting: Emergency Medicine

## 2024-10-30 ENCOUNTER — Other Ambulatory Visit: Payer: Self-pay

## 2024-10-30 DIAGNOSIS — J069 Acute upper respiratory infection, unspecified: Secondary | ICD-10-CM | POA: Diagnosis not present

## 2024-10-30 DIAGNOSIS — J45901 Unspecified asthma with (acute) exacerbation: Secondary | ICD-10-CM | POA: Diagnosis not present

## 2024-10-30 DIAGNOSIS — B9789 Other viral agents as the cause of diseases classified elsewhere: Secondary | ICD-10-CM | POA: Insufficient documentation

## 2024-10-30 DIAGNOSIS — R0602 Shortness of breath: Secondary | ICD-10-CM | POA: Diagnosis present

## 2024-10-30 LAB — RESP PANEL BY RT-PCR (RSV, FLU A&B, COVID)  RVPGX2
Influenza A by PCR: NEGATIVE
Influenza B by PCR: NEGATIVE
Resp Syncytial Virus by PCR: NEGATIVE
SARS Coronavirus 2 by RT PCR: NEGATIVE

## 2024-10-30 LAB — GROUP A STREP BY PCR: Group A Strep by PCR: NOT DETECTED

## 2024-10-30 LAB — BASIC METABOLIC PANEL WITH GFR
Anion gap: 10 (ref 5–15)
BUN: 8 mg/dL (ref 6–20)
CO2: 23 mmol/L (ref 22–32)
Calcium: 9.3 mg/dL (ref 8.9–10.3)
Chloride: 106 mmol/L (ref 98–111)
Creatinine, Ser: 0.69 mg/dL (ref 0.44–1.00)
GFR, Estimated: >60 mL/min
Glucose, Bld: 84 mg/dL (ref 70–99)
Potassium: 4.1 mmol/L (ref 3.5–5.1)
Sodium: 139 mmol/L (ref 135–145)

## 2024-10-30 LAB — CBC
HCT: 42.8 % (ref 36.0–46.0)
Hemoglobin: 13.8 g/dL (ref 12.0–15.0)
MCH: 27.6 pg (ref 26.0–34.0)
MCHC: 32.2 g/dL (ref 30.0–36.0)
MCV: 85.6 fL (ref 80.0–100.0)
Platelets: 264 K/uL (ref 150–400)
RBC: 5 MIL/uL (ref 3.87–5.11)
RDW: 13 % (ref 11.5–15.5)
WBC: 5.4 K/uL (ref 4.0–10.5)
nRBC: 0 % (ref 0.0–0.2)

## 2024-10-30 LAB — TROPONIN T, HIGH SENSITIVITY: Troponin T High Sensitivity: <15 ng/L (ref 0–19)

## 2024-10-30 LAB — POC URINE PREG, ED: Preg Test, Ur: NEGATIVE

## 2024-10-30 MED ORDER — PREDNISONE 20 MG PO TABS
60.0000 mg | ORAL_TABLET | Freq: Once | ORAL | Status: AC
  Administered 2024-10-30: 60 mg via ORAL
  Filled 2024-10-30: qty 3

## 2024-10-30 MED ORDER — IPRATROPIUM-ALBUTEROL 0.5-2.5 (3) MG/3ML IN SOLN
3.0000 mL | Freq: Once | RESPIRATORY_TRACT | Status: AC
  Administered 2024-10-30: 3 mL via RESPIRATORY_TRACT
  Filled 2024-10-30: qty 3

## 2024-10-30 MED ORDER — ALBUTEROL SULFATE HFA 108 (90 BASE) MCG/ACT IN AERS: 2.0000 | INHALATION_SPRAY | Freq: Four times a day (QID) | RESPIRATORY_TRACT | 2 refills | Status: AC | PRN

## 2024-10-30 MED ORDER — PREDNISONE 50 MG PO TABS: 50.0000 mg | ORAL_TABLET | Freq: Every day | ORAL | 0 refills | Status: AC

## 2024-10-30 MED ORDER — ALBUTEROL SULFATE (2.5 MG/3ML) 0.083% IN NEBU: 2.5000 mg | INHALATION_SOLUTION | RESPIRATORY_TRACT | 2 refills | Status: AC | PRN

## 2024-10-30 NOTE — ED Triage Notes (Signed)
 Pt to ED via POV from home. Pt reports centralized CP and SOB. Pt with hx of asthma. Pt reports no relief with at home inhaler and breathing tx.

## 2024-10-30 NOTE — ED Provider Notes (Signed)
 "  [facility] Provider Note    Event Date/Time   First MD Initiated Contact with Patient 10/30/24 [phone]     (approximate)   History   Chest Pain and Shortness of Breath (/)   HPI  Lori Stevenson is a 28 y.o. female with a history of asthma who presents with complaints of cough, chest tightness, mild runny nose.  Symptoms started over the last couple of days.  Does not know if she has had a fever     Physical Exam   Triage Vital Signs: ED Triage Vitals  Encounter Vitals Group     BP 10/30/24 0900 (!) 126/95     Girls Systolic BP Percentile --      Girls Diastolic BP Percentile --      Boys Systolic BP Percentile --      Boys Diastolic BP Percentile --      Pulse Rate 10/30/24 0900 85     Resp 10/30/24 0900 18     Temp 10/30/24 0858 98.2 F (36.8 C)     Temp Source 10/30/24 0858 Oral     SpO2 10/30/24 0900 97 %     Weight --      Height --      Head Circumference --      Peak Flow --      Pain Score 10/30/24 0858 6     Pain Loc --      Pain Education --      Exclude from Growth Chart --     Most recent vital signs: Vitals:   10/30/24 0858 10/30/24 0900  BP:  (!) 126/95  Pulse:  85  Resp:  18  Temp: 98.2 F (36.8 C)   SpO2:  97%     General: Awake, no distress.  CV:  Good peripheral perfusion.  Resp:  Normal effort.  Scattered wheezing, no rales Abd:  No distention.  Other:     ED Results / Procedures / Treatments   Labs (all labs ordered are listed, but only abnormal results are displayed) Labs Reviewed  GROUP A STREP BY PCR  RESP PANEL BY RT-PCR (RSV, FLU A&B, COVID)  [id number]  BASIC METABOLIC PANEL WITH GFR  CBC  POC URINE PREG, ED  TROPONIN T, HIGH SENSITIVITY     EKG  ED ECG REPORT I, Lamar Price, the attending physician, personally viewed and interpreted this ECG.  Date: 10/30/2024  Rhythm: normal sinus rhythm QRS Axis: normal Intervals: normal ST/T Wave abnormalities: normal Narrative  Interpretation: no evidence of acute ischemia    RADIOLOGY Chest x-ray viewed interpreted by me, no acute abnormality    PROCEDURES:  Critical Care performed:   Procedures   MEDICATIONS ORDERED IN ED: Medications  predniSONE  (DELTASONE ) tablet 60 mg (60 mg Oral Given 10/30/24 1021)  ipratropium-albuterol  (DUONEB) 0.5-2.5 (3) MG/3ML nebulizer solution 3 mL (3 mLs Nebulization Given 10/30/24 1022)  ipratropium-albuterol  (DUONEB) 0.5-2.5 (3) MG/3ML nebulizer solution 3 mL (3 mLs Nebulization Given 10/30/24 1022)     IMPRESSION / MDM / ASSESSMENT AND PLAN / ED COURSE  I reviewed the triage vital signs and the nursing notes. Patient's presentation is most consistent with severe exacerbation of chronic illness.  Patient presents with complaints of shortness of breath, chest tightness in the setting of cough, upper respiratory infection.  Differential includes influenza, viral upper respiratory infection, CAP causing asthma exacerbation.  Will treat with DuoNebs, p.o. prednisone   Lab work is reassuring, COVID flu RSV negative, chest x-ray without  evidence of pneumonia.  ----------------------------------------- 11:39 AM on 10/30/2024 -----------------------------------------  Patient is feeling improved, no wheezing on reexam, appropriate for discharge at this time, no indication for admission.       FINAL CLINICAL IMPRESSION(S) / ED DIAGNOSES   Final diagnoses:  Viral upper respiratory tract infection  Exacerbation of asthma, unspecified asthma severity, unspecified whether persistent     Rx / DC Orders   ED Discharge Orders          Ordered    predniSONE  (DELTASONE ) 50 MG tablet  Daily with breakfast        10/30/24 1136    albuterol  (VENTOLIN  HFA) 108 (90 Base) MCG/ACT inhaler  Every 6 hours PRN        10/30/24 1136    albuterol  (PROVENTIL ) (2.5 MG/3ML) 0.083% nebulizer solution  Every 4 hours PRN        10/30/24 1136             Note:  This document  was prepared using Dragon voice recognition software and may include unintentional dictation errors.   Arlander Charleston, MD 10/30/24 1139  "

## 2024-11-18 NOTE — Progress Notes (Unsigned)
" ° ° °  NURSE VISIT NOTE  Subjective:    Patient ID: Hargis Boning, female    DOB: 06/08/1996, 29 y.o.   MRN: [id number]  HPI  Patient is a 29 y.o. G36P2002 female who presents for depo provera  injection.   Objective:    BP 101/77   Pulse 74   Ht 4' 10 (1.473 m)   Wt 147 lb 1.6 oz (66.7 kg)   LMP 11/09/2024 (Approximate)   BMI 30.74 kg/m   Last Annual: 07/06/24. Last pap: 02/14/22. Last Depo-Provera : 08/19/25. Side Effects if any: none. Serum HCG indicated? No . Depo-Provera  150 mg IM given by: Mathis Getting, CMA. Site: Left Deltoid  Lab Review  No results found for any visits on 11/19/24.  Assessment:   1. Encounter for management and injection of depo-Provera       Plan:   Next appointment due between 02/04/25 and 02/18/25.    Mathis LITTIE Getting, CMA  "

## 2024-11-19 ENCOUNTER — Ambulatory Visit

## 2024-11-19 VITALS — BP 101/77 | HR 74 | Ht <= 58 in | Wt 147.1 lb

## 2024-11-19 DIAGNOSIS — Z3042 Encounter for surveillance of injectable contraceptive: Secondary | ICD-10-CM

## 2024-11-19 DIAGNOSIS — Z3202 Encounter for pregnancy test, result negative: Secondary | ICD-10-CM | POA: Diagnosis not present

## 2024-11-19 NOTE — Patient Instructions (Signed)

## 2024-11-22 DIAGNOSIS — Z3042 Encounter for surveillance of injectable contraceptive: Secondary | ICD-10-CM | POA: Diagnosis not present

## 2024-11-22 LAB — POCT URINE PREGNANCY: Preg Test, Ur: NEGATIVE

## 2024-11-22 MED ORDER — MEDROXYPROGESTERONE ACETATE 150 MG/ML IM SUSP
150.0000 mg | Freq: Once | INTRAMUSCULAR | Status: AC
  Administered 2024-11-22: 150 mg via INTRAMUSCULAR

## 2025-02-11 ENCOUNTER — Ambulatory Visit
